# Patient Record
Sex: Male | Born: 1950
Health system: Southern US, Community
[De-identification: ages and names within clinical notes are randomized; demographics above are authoritative.]

## PROBLEM LIST (undated history)

## (undated) DIAGNOSIS — E785 Hyperlipidemia, unspecified: Secondary | ICD-10-CM

## (undated) DIAGNOSIS — I10 Essential (primary) hypertension: Secondary | ICD-10-CM

## (undated) DIAGNOSIS — R600 Localized edema: Secondary | ICD-10-CM

## (undated) DIAGNOSIS — N189 Chronic kidney disease, unspecified: Secondary | ICD-10-CM

## (undated) DIAGNOSIS — I509 Heart failure, unspecified: Secondary | ICD-10-CM

## (undated) DIAGNOSIS — R609 Edema, unspecified: Secondary | ICD-10-CM

## (undated) HISTORY — DX: Chronic kidney disease, unspecified: N18.9

---

## 2006-05-16 ENCOUNTER — Other Ambulatory Visit: Payer: Self-pay

## 2006-05-16 ENCOUNTER — Emergency Department: Payer: Self-pay | Admitting: Unknown Physician Specialty

## 2006-12-31 ENCOUNTER — Ambulatory Visit: Payer: Self-pay

## 2007-01-12 ENCOUNTER — Ambulatory Visit: Payer: Self-pay

## 2018-05-24 DIAGNOSIS — R05 Cough: Secondary | ICD-10-CM | POA: Diagnosis not present

## 2018-05-24 DIAGNOSIS — J181 Lobar pneumonia, unspecified organism: Secondary | ICD-10-CM | POA: Diagnosis not present

## 2018-08-29 DIAGNOSIS — J209 Acute bronchitis, unspecified: Secondary | ICD-10-CM | POA: Diagnosis not present

## 2018-10-26 DIAGNOSIS — I10 Essential (primary) hypertension: Secondary | ICD-10-CM | POA: Diagnosis not present

## 2018-10-26 DIAGNOSIS — I499 Cardiac arrhythmia, unspecified: Secondary | ICD-10-CM | POA: Diagnosis not present

## 2018-10-26 DIAGNOSIS — Z1389 Encounter for screening for other disorder: Secondary | ICD-10-CM | POA: Diagnosis not present

## 2018-11-28 DIAGNOSIS — I499 Cardiac arrhythmia, unspecified: Secondary | ICD-10-CM | POA: Diagnosis not present

## 2018-11-28 DIAGNOSIS — Z0000 Encounter for general adult medical examination without abnormal findings: Secondary | ICD-10-CM | POA: Diagnosis not present

## 2018-11-28 DIAGNOSIS — E785 Hyperlipidemia, unspecified: Secondary | ICD-10-CM | POA: Diagnosis not present

## 2018-11-28 DIAGNOSIS — I10 Essential (primary) hypertension: Secondary | ICD-10-CM | POA: Diagnosis not present

## 2018-11-28 DIAGNOSIS — Z23 Encounter for immunization: Secondary | ICD-10-CM | POA: Diagnosis not present

## 2018-12-25 DIAGNOSIS — E785 Hyperlipidemia, unspecified: Secondary | ICD-10-CM | POA: Diagnosis not present

## 2018-12-25 DIAGNOSIS — I10 Essential (primary) hypertension: Secondary | ICD-10-CM | POA: Diagnosis not present

## 2019-01-11 DIAGNOSIS — E785 Hyperlipidemia, unspecified: Secondary | ICD-10-CM | POA: Diagnosis not present

## 2019-01-11 DIAGNOSIS — Z6836 Body mass index (BMI) 36.0-36.9, adult: Secondary | ICD-10-CM | POA: Diagnosis not present

## 2019-02-13 DIAGNOSIS — I10 Essential (primary) hypertension: Secondary | ICD-10-CM | POA: Diagnosis not present

## 2019-02-13 DIAGNOSIS — D751 Secondary polycythemia: Secondary | ICD-10-CM | POA: Diagnosis not present

## 2019-02-13 DIAGNOSIS — R05 Cough: Secondary | ICD-10-CM | POA: Diagnosis not present

## 2019-02-13 DIAGNOSIS — E785 Hyperlipidemia, unspecified: Secondary | ICD-10-CM | POA: Diagnosis not present

## 2019-02-13 DIAGNOSIS — E871 Hypo-osmolality and hyponatremia: Secondary | ICD-10-CM | POA: Diagnosis not present

## 2019-02-13 DIAGNOSIS — M170 Bilateral primary osteoarthritis of knee: Secondary | ICD-10-CM | POA: Diagnosis not present

## 2020-12-11 ENCOUNTER — Other Ambulatory Visit: Payer: Self-pay | Admitting: Student

## 2020-12-11 ENCOUNTER — Other Ambulatory Visit (HOSPITAL_COMMUNITY): Payer: Self-pay | Admitting: Family Medicine

## 2020-12-11 ENCOUNTER — Other Ambulatory Visit: Payer: Self-pay

## 2020-12-11 ENCOUNTER — Ambulatory Visit
Admission: RE | Admit: 2020-12-11 | Discharge: 2020-12-11 | Disposition: A | Discharge status: Home / Self Care | Payer: Medicare HMO | Source: Ambulatory Visit | Attending: Family Medicine | Admitting: Family Medicine

## 2020-12-11 ENCOUNTER — Ambulatory Visit
Admission: RE | Admit: 2020-12-11 | Discharge: 2020-12-11 | Disposition: A | Discharge status: Home / Self Care | Payer: Medicare HMO | Attending: Family Medicine | Admitting: Family Medicine

## 2020-12-11 DIAGNOSIS — R059 Cough, unspecified: Secondary | ICD-10-CM

## 2020-12-23 ENCOUNTER — Other Ambulatory Visit: Payer: Self-pay | Admitting: Student

## 2020-12-23 DIAGNOSIS — E669 Obesity, unspecified: Secondary | ICD-10-CM

## 2020-12-23 DIAGNOSIS — N183 Chronic kidney disease, stage 3 unspecified: Secondary | ICD-10-CM

## 2020-12-23 DIAGNOSIS — R0601 Orthopnea: Secondary | ICD-10-CM

## 2020-12-30 ENCOUNTER — Other Ambulatory Visit: Payer: Self-pay

## 2020-12-30 ENCOUNTER — Ambulatory Visit
Admission: RE | Admit: 2020-12-30 | Discharge: 2020-12-30 | Disposition: A | Discharge status: Home / Self Care | Payer: Medicare HMO | Source: Ambulatory Visit | Attending: Obstetrics and Gynecology | Admitting: Obstetrics and Gynecology

## 2020-12-30 ENCOUNTER — Other Ambulatory Visit: Payer: Self-pay | Admitting: Student

## 2020-12-30 DIAGNOSIS — N183 Chronic kidney disease, stage 3 unspecified: Secondary | ICD-10-CM | POA: Diagnosis present

## 2021-01-07 ENCOUNTER — Other Ambulatory Visit: Payer: Self-pay

## 2021-01-07 ENCOUNTER — Ambulatory Visit
Admission: RE | Admit: 2021-01-07 | Discharge: 2021-01-07 | Disposition: A | Discharge status: Home / Self Care | Payer: Medicare HMO | Source: Ambulatory Visit | Attending: Family Medicine | Admitting: Family Medicine

## 2021-01-07 DIAGNOSIS — I081 Rheumatic disorders of both mitral and tricuspid valves: Secondary | ICD-10-CM | POA: Diagnosis not present

## 2021-01-07 DIAGNOSIS — I313 Pericardial effusion (noninflammatory): Secondary | ICD-10-CM | POA: Insufficient documentation

## 2021-01-07 DIAGNOSIS — R0601 Orthopnea: Secondary | ICD-10-CM | POA: Insufficient documentation

## 2021-01-07 DIAGNOSIS — Z6839 Body mass index (BMI) 39.0-39.9, adult: Secondary | ICD-10-CM | POA: Insufficient documentation

## 2021-01-07 DIAGNOSIS — E669 Obesity, unspecified: Secondary | ICD-10-CM | POA: Insufficient documentation

## 2021-01-07 LAB — ECHOCARDIOGRAM COMPLETE
AR max vel: 1.6 cm2
AV Area VTI: 1.66 cm2
AV Area mean vel: 1.64 cm2
AV Mean grad: 1.7 mmHg
AV Peak grad: 3.1 mmHg
Ao pk vel: 0.88 m/s
Area-P 1/2: 4.83 cm2
Calc EF: 26.2 %
S' Lateral: 3.98 cm
Single Plane A2C EF: 32.9 %
Single Plane A4C EF: 17.8 %

## 2021-01-07 NOTE — Progress Notes (Signed)
*  PRELIMINARY RESULTS* Echocardiogram 2D Echocardiogram has been performed.  Tyler Ochoa 01/07/2021, 11:41 AM

## 2021-02-02 ENCOUNTER — Other Ambulatory Visit: Payer: Self-pay

## 2021-02-02 ENCOUNTER — Emergency Department: Payer: Medicare HMO

## 2021-02-02 ENCOUNTER — Encounter: Payer: Self-pay | Admitting: Emergency Medicine

## 2021-02-02 ENCOUNTER — Inpatient Hospital Stay
Admission: EM | Admit: 2021-02-02 | Discharge: 2021-02-09 | DRG: 291 | Disposition: A | Discharge status: Home Health | Payer: Medicare HMO | Attending: Internal Medicine | Admitting: Internal Medicine

## 2021-02-02 DIAGNOSIS — E65 Localized adiposity: Secondary | ICD-10-CM | POA: Diagnosis present

## 2021-02-02 DIAGNOSIS — I509 Heart failure, unspecified: Secondary | ICD-10-CM

## 2021-02-02 DIAGNOSIS — I10 Essential (primary) hypertension: Secondary | ICD-10-CM | POA: Diagnosis not present

## 2021-02-02 DIAGNOSIS — E876 Hypokalemia: Secondary | ICD-10-CM | POA: Diagnosis present

## 2021-02-02 DIAGNOSIS — I5023 Acute on chronic systolic (congestive) heart failure: Principal | ICD-10-CM | POA: Diagnosis present

## 2021-02-02 DIAGNOSIS — N179 Acute kidney failure, unspecified: Secondary | ICD-10-CM | POA: Diagnosis present

## 2021-02-02 DIAGNOSIS — M170 Bilateral primary osteoarthritis of knee: Secondary | ICD-10-CM

## 2021-02-02 DIAGNOSIS — E871 Hypo-osmolality and hyponatremia: Secondary | ICD-10-CM | POA: Diagnosis present

## 2021-02-02 DIAGNOSIS — E785 Hyperlipidemia, unspecified: Secondary | ICD-10-CM | POA: Diagnosis present

## 2021-02-02 DIAGNOSIS — E1122 Type 2 diabetes mellitus with diabetic chronic kidney disease: Secondary | ICD-10-CM | POA: Diagnosis present

## 2021-02-02 DIAGNOSIS — K767 Hepatorenal syndrome: Secondary | ICD-10-CM | POA: Diagnosis present

## 2021-02-02 DIAGNOSIS — I5082 Biventricular heart failure: Secondary | ICD-10-CM | POA: Diagnosis present

## 2021-02-02 DIAGNOSIS — I130 Hypertensive heart and chronic kidney disease with heart failure and stage 1 through stage 4 chronic kidney disease, or unspecified chronic kidney disease: Principal | ICD-10-CM | POA: Diagnosis present

## 2021-02-02 DIAGNOSIS — N5089 Other specified disorders of the male genital organs: Secondary | ICD-10-CM | POA: Diagnosis present

## 2021-02-02 DIAGNOSIS — Z6838 Body mass index (BMI) 38.0-38.9, adult: Secondary | ICD-10-CM

## 2021-02-02 DIAGNOSIS — Z20822 Contact with and (suspected) exposure to covid-19: Secondary | ICD-10-CM | POA: Diagnosis present

## 2021-02-02 DIAGNOSIS — E782 Mixed hyperlipidemia: Secondary | ICD-10-CM | POA: Diagnosis not present

## 2021-02-02 DIAGNOSIS — E8770 Fluid overload, unspecified: Secondary | ICD-10-CM | POA: Diagnosis present

## 2021-02-02 DIAGNOSIS — N1831 Chronic kidney disease, stage 3a: Secondary | ICD-10-CM | POA: Diagnosis present

## 2021-02-02 DIAGNOSIS — I248 Other forms of acute ischemic heart disease: Secondary | ICD-10-CM | POA: Diagnosis present

## 2021-02-02 HISTORY — DX: Essential (primary) hypertension: I10

## 2021-02-02 HISTORY — DX: Hyperlipidemia, unspecified: E78.5

## 2021-02-02 HISTORY — DX: Edema, unspecified: R60.9

## 2021-02-02 HISTORY — DX: Localized edema: R60.0

## 2021-02-02 LAB — BASIC METABOLIC PANEL
Anion gap: 14 (ref 5–15)
BUN: 12 mg/dL (ref 8–23)
CO2: 22 mmol/L (ref 22–32)
Calcium: 9.2 mg/dL (ref 8.9–10.3)
Chloride: 89 mmol/L — ABNORMAL LOW (ref 98–111)
Creatinine, Ser: 1.5 mg/dL — ABNORMAL HIGH (ref 0.61–1.24)
GFR, Estimated: 51 mL/min — ABNORMAL LOW (ref >60)
Glucose, Bld: 127 mg/dL — ABNORMAL HIGH (ref 70–99)
Potassium: 3 mmol/L — ABNORMAL LOW (ref 3.5–5.1)
Sodium: 125 mmol/L — ABNORMAL LOW (ref 135–145)

## 2021-02-02 LAB — CBC
HCT: 42.2 % (ref 39.0–52.0)
Hemoglobin: 14.3 g/dL (ref 13.0–17.0)
MCH: 28.5 pg (ref 26.0–34.0)
MCHC: 33.9 g/dL (ref 30.0–36.0)
MCV: 84.2 fL (ref 80.0–100.0)
Platelets: 241 10*3/uL (ref 150–400)
RBC: 5.01 MIL/uL (ref 4.22–5.81)
RDW: 15.9 % — ABNORMAL HIGH (ref 11.5–15.5)
WBC: 3.3 10*3/uL — ABNORMAL LOW (ref 4.0–10.5)
nRBC: 0 % (ref 0.0–0.2)

## 2021-02-02 LAB — TROPONIN I (HIGH SENSITIVITY): Troponin I (High Sensitivity): 46 ng/L — ABNORMAL HIGH (ref <18)

## 2021-02-02 LAB — BRAIN NATRIURETIC PEPTIDE: B Natriuretic Peptide: 449.3 pg/mL — ABNORMAL HIGH (ref 0.0–100.0)

## 2021-02-02 LAB — RESP PANEL BY RT-PCR (FLU A&B, COVID) ARPGX2
Influenza A by PCR: NEGATIVE
Influenza B by PCR: NEGATIVE
SARS Coronavirus 2 by RT PCR: NEGATIVE

## 2021-02-02 MED ORDER — SODIUM CHLORIDE 0.9 % IV SOLN: 250.0000 mL | INTRAVENOUS | Status: DC | PRN

## 2021-02-02 MED ORDER — SODIUM CHLORIDE 0.9% FLUSH
3.0000 mL | Freq: Two times a day (BID) | INTRAVENOUS | Status: DC
  Administered 2021-02-02 – 2021-02-09 (×13): 3 mL via INTRAVENOUS

## 2021-02-02 MED ORDER — FUROSEMIDE 10 MG/ML IJ SOLN
40.0000 mg | Freq: Once | INTRAMUSCULAR | Status: AC
  Administered 2021-02-02: 40 mg via INTRAVENOUS
  Filled 2021-02-02: qty 4

## 2021-02-02 MED ORDER — HEPARIN SODIUM (PORCINE) 5000 UNIT/ML IJ SOLN
5000.0000 [IU] | Freq: Three times a day (TID) | INTRAMUSCULAR | Status: DC
  Administered 2021-02-02 – 2021-02-09 (×20): 5000 [IU] via SUBCUTANEOUS
  Filled 2021-02-02 (×19): qty 1

## 2021-02-02 MED ORDER — FUROSEMIDE 10 MG/ML IJ SOLN
60.0000 mg | Freq: Every day | INTRAMUSCULAR | Status: DC
  Administered 2021-02-03 – 2021-02-04 (×2): 60 mg via INTRAVENOUS
  Filled 2021-02-02: qty 8
  Filled 2021-02-02: qty 6

## 2021-02-02 MED ORDER — ONDANSETRON HCL 4 MG/2ML IJ SOLN: 4.0000 mg | Freq: Four times a day (QID) | INTRAMUSCULAR | Status: DC | PRN

## 2021-02-02 MED ORDER — FUROSEMIDE 10 MG/ML IJ SOLN: 60.0000 mg | Freq: Every day | INTRAMUSCULAR | Status: DC

## 2021-02-02 MED ORDER — ACETAMINOPHEN 325 MG PO TABS
650.0000 mg | ORAL_TABLET | ORAL | Status: DC | PRN
  Administered 2021-02-03 – 2021-02-08 (×2): 650 mg via ORAL
  Filled 2021-02-02 (×3): qty 2

## 2021-02-02 MED ORDER — SODIUM CHLORIDE 0.9% FLUSH: 3.0000 mL | INTRAVENOUS | Status: DC | PRN

## 2021-02-02 MED ORDER — POTASSIUM CHLORIDE 10 MEQ/100ML IV SOLN
10.0000 meq | Freq: Once | INTRAVENOUS | Status: AC
  Administered 2021-02-02: 10 meq via INTRAVENOUS
  Filled 2021-02-02: qty 100

## 2021-02-02 MED ORDER — POTASSIUM CHLORIDE CRYS ER 20 MEQ PO TBCR
40.0000 meq | EXTENDED_RELEASE_TABLET | Freq: Once | ORAL | Status: AC
  Administered 2021-02-02: 40 meq via ORAL
  Filled 2021-02-02: qty 2

## 2021-02-02 NOTE — ED Provider Notes (Signed)
[facility] Emergency Department Provider Note  ____________________________________________   Event Date/Time   First MD Initiated Contact with Patient 02/02/21 1627     (approximate)  I have reviewed the triage vital signs and the nursing notes.   HISTORY  Chief Complaint Groin Swelling    HPI Tyler Ochoa is a 70 y.o. male with history of hypertension, hyperlipidemia, recently diagnosed CHF (EF 25-30%), here with worsening leg swelling and persistent scrotal swelling.  The patient states that since he was sick in January with what he believes was Covid, he has had progressively worsening lower extremity swelling, scrotal swelling, and shortness of breath with exertion.  He was seen recently by a new PCP and diagnosed with CHF.  Started on Lasix and potassium.'s been taking this and states it has steadily improved his legs but he has persistent scrotal swelling.  He presents here for repeat evaluation.  He feels like he needs more Lasix.  He has a PCP appointment next week.  He feels short of breath with exertion but not short of breath at rest.  No severe orthopnea.  No fevers or chills.  No chest pain.       Past Medical History:  Diagnosis Date  . Hyperlipidemia   . Hypertension   . Peripheral edema     Patient Active Problem List   Diagnosis Date Noted  . Acute exacerbation of CHF (congestive heart failure) (HCC) 02/02/2021    History reviewed. No pertinent surgical history.  Prior to Admission medications   Not on File    Allergies Patient has no known allergies.  History reviewed. No pertinent family history.  Social History Social History   Tobacco Use  . Smoking status: Never Smoker  . Smokeless tobacco: Never Used  Vaping Use  . Vaping Use: Never used    Review of Systems  Review of Systems  Constitutional: Positive for fatigue. Negative for chills and fever.  HENT: Negative for sore throat.   Respiratory: Positive  for shortness of breath.   Cardiovascular: Positive for leg swelling. Negative for chest pain.  Gastrointestinal: Negative for abdominal pain.  Genitourinary: Negative for flank pain.  Musculoskeletal: Negative for neck pain.  Skin: Negative for rash and wound.  Allergic/Immunologic: Negative for immunocompromised state.  Neurological: Positive for weakness. Negative for numbness.  Hematological: Does not bruise/bleed easily.  All other systems reviewed and are negative.    ____________________________________________  PHYSICAL EXAM:      VITAL SIGNS: ED Triage Vitals [02/02/21 1350]  Enc Vitals Group     BP 119/79     Pulse Rate (!) 101     Resp 20     Temp 98.1 F (36.7 C)     Temp Source Oral     SpO2 97 %     Weight (!) 317 lb (143.8 kg)     Height 6\' 2"  (1.88 m)     Head Circumference      Peak Flow      Pain Score 0     Pain Loc      Pain Edu?      Excl. in GC?      Physical Exam Vitals and nursing note reviewed.  Constitutional:      General: He is not in acute distress.    Appearance: He is well-developed.  HENT:     Head: Normocephalic and atraumatic.  Eyes:     Conjunctiva/sclera: Conjunctivae normal.  Cardiovascular:     Rate and  Rhythm: Normal rate and regular rhythm.     Heart sounds: Normal heart sounds.  Pulmonary:     Effort: Pulmonary effort is normal. No respiratory distress.     Breath sounds: Rales (bibasilar rales) present. No wheezing.  Abdominal:     General: There is no distension.  Musculoskeletal:     Cervical back: Neck supple.     Right lower leg: Edema (2+ pitting) present.     Left lower leg: Edema (2+ pitting) present.  Skin:    General: Skin is warm.     Capillary Refill: Capillary refill takes less than 2 seconds.     Findings: No rash.  Neurological:     Mental Status: He is alert and oriented to person, place, and time.     Motor: No abnormal muscle tone.       ____________________________________________    LABS (all labs ordered are listed, but only abnormal results are displayed)  Labs Reviewed  CBC - Abnormal; Notable for the following components:      Result Value   WBC 3.3 (*)    RDW 15.9 (*)    All other components within normal limits  BASIC METABOLIC PANEL - Abnormal; Notable for the following components:   Sodium 125 (*)    Potassium 3.0 (*)    Chloride 89 (*)    Glucose, Bld 127 (*)    Creatinine, Ser 1.50 (*)    GFR, Estimated 51 (*)    All other components within normal limits  BRAIN NATRIURETIC PEPTIDE - Abnormal; Notable for the following components:   B Natriuretic Peptide 449.3 (*)    All other components within normal limits  TROPONIN I (HIGH SENSITIVITY) - Abnormal; Notable for the following components:   Troponin I (High Sensitivity) 46 (*)    All other components within normal limits  RESP PANEL BY RT-PCR (FLU A&B, COVID) ARPGX2  OSMOLALITY  URINALYSIS, COMPLETE (UACMP) WITH MICROSCOPIC  OSMOLALITY, URINE  SODIUM, URINE, RANDOM  CREATININE, URINE, RANDOM  HIV ANTIBODY (ROUTINE TESTING W REFLEX)  BASIC METABOLIC PANEL  CBC WITH DIFFERENTIAL/PLATELET  TSH    ____________________________________________  EKG: Sinus rhythm, ventricular rate 97.  QRS 84, QTc 445.  No acute ST elevations or depressions.  Prolonged PR interval. ________________________________________  RADIOLOGY All imaging, including plain films, CT scans, and ultrasounds, independently reviewed by me, and interpretations confirmed via formal radiology reads.  ED MD interpretation:   Chest x-ray: No acute disease  Official radiology report(s): DG Chest 2 View  Result Date: 02/02/2021 CLINICAL DATA:  Cough, shortness of breath. EXAM: CHEST - 2 VIEW COMPARISON:  December 11, 2020. FINDINGS: Stable cardiomediastinal silhouette. No pneumothorax or pleural effusion is noted. Both lungs are clear. The visualized skeletal structures are unremarkable. IMPRESSION: No active cardiopulmonary disease.  Electronically Signed   By: Lupita Raider M.D.   On: 02/02/2021 18:26    ____________________________________________  PROCEDURES   Procedure(s) performed (including Critical Care):  .1-3 Lead EKG Interpretation Performed by: Shaune Pollack, MD Authorized by: Shaune Pollack, MD     Interpretation: normal     ECG rate:  90-100   ECG rate assessment: normal     Rhythm: sinus rhythm     Ectopy: none     Conduction: normal   Comments:     Indication: CHF    ____________________________________________  INITIAL IMPRESSION / MDM / ASSESSMENT AND PLAN / ED COURSE  As part of my medical decision making, I reviewed the following data within the electronic  MEDICAL RECORD NUMBER Nursing notes reviewed and incorporated, Old chart reviewed, Notes from prior ED visits, and [place] Controlled Substance Database       *ONESIMO LINGARD was evaluated in Emergency Department on 02/02/2021 for the symptoms described in the history of present illness. He was evaluated in the context of the global COVID-19 pandemic, which necessitated consideration that the patient might be at risk for infection with the SARS-CoV-2 virus that causes COVID-19. Institutional protocols and algorithms that pertain to the evaluation of patients at risk for COVID-19 are in a state of rapid change based on information released by regulatory bodies including the CDC and federal and state organizations. These policies and algorithms were followed during the patient's care in the ED.  Some ED evaluations and interventions may be delayed as a result of limited staffing during the pandemic.*     Medical Decision Making: 70 year old male here with anasarca secondary to systolic CHF with acute exacerbation.  Patient drinks a significant amount of fluid daily.  Lab work is consistent with CHF with elevated BNP as well as hyponatremia, which I suspect is hypovolemic in etiology.  Will start IV Lasix and admit.  Patient has no headache,  tremors, confusion, or evidence to suggest symptomatic hyponatremia.  ____________________________________________  FINAL CLINICAL IMPRESSION(S) / ED DIAGNOSES  Final diagnoses:  Acute on chronic systolic congestive heart failure (HCC)     MEDICATIONS GIVEN DURING THIS VISIT:  Medications  sodium chloride flush (NS) 0.9 % injection 3 mL (has no administration in time range)  sodium chloride flush (NS) 0.9 % injection 3 mL (has no administration in time range)  0.9 %  sodium chloride infusion (has no administration in time range)  acetaminophen (TYLENOL) tablet 650 mg (has no administration in time range)  ondansetron (ZOFRAN) injection 4 mg (has no administration in time range)  heparin injection 5,000 Units (has no administration in time range)  furosemide (LASIX) injection 60 mg (has no administration in time range)  furosemide (LASIX) injection 40 mg (40 mg Intravenous Given 02/02/21 1657)  potassium chloride SA (KLOR-CON) CR tablet 40 mEq (40 mEq Oral Given 02/02/21 1657)  potassium chloride 10 mEq in 100 mL IVPB (0 mEq Intravenous Stopped 02/02/21 1808)     ED Discharge Orders    None       Note:  This document was prepared using Dragon voice recognition software and may include unintentional dictation errors.   Shaune Pollack, MD 02/02/21 [phone]

## 2021-02-02 NOTE — ED Triage Notes (Signed)
Pt presents to the ED from home, POV. Pt states he has been recently prescribed Lasix for bilateral leg and scrotal swelling last Friday. Pt states that the swelling went down some in his ankle but states the swelling hasn't went down in his scrotum. Denies pain. Denies SOB. Pt is A&Ox4 and NAD.

## 2021-02-02 NOTE — H&P (Signed)
History and Physical   Tyler Ochoa:[id number] DOB: 1951-03-19 DOA: 02/02/2021  PCP: Manson Allan, MD  Patient coming from: home  I have personally briefly reviewed patient's old medical records in [place].  Chief Concern: shortness of breath  HPI: Tyler Ochoa is a 70 y.o. male with medical history significant for hypertension, heart failure reduced ejection fraction, is a new diagnosis, presents to the emergency department for chief concerns of shortness of breath that is worse with exertion.  Patient has been having shortness of breath over the last 3 months.  He reports that he has gained unintentionally 30 pounds.  He also endorses bilateral lower extremity edema and scrotal swelling.  He denies scrotal pain.  He states that the main reason he is coming into the hospital because with Lasix compliance he still does not have improved scrotal swelling.  He endorses bilateral lower extremity edema.  He states that with the Lasix use his lower extremity edema has improved.  He was recently diagnosed with heart failure reduced ejection fraction via cardiac echo ordered by his PCP for 01/07/2021.  He was prescribed Lasix 20 mg daily, which she has been taking.  Of note on further questioning, patient drinks 1 gallon of water every day. Patients states that he has been drinking 1 gallon of water per day for a long time as he was told in the past "drinking a lot of water was good for and he gets thirsty.  Occasionally, he consumes 12 ounce beers per day & Sunny D.  He states he does not drink tea, coffee, soda.  Social history: He lives with his wife/partner/girlfriend.  He infrequently drinks alcohol.  He denies tobacco history and recreational drug use.  Vaccination: Patient is vaccinated for COVID-19  At bedside, he is not in acute distress and not having any difficulty maintaining conversation with me.  He is able to tell me his name, his age, as location of hospital, the  current calendar year.  No increase respiratory muscle use with full conversation.  His main concern is the scrotal swelling.  ROS: Constitutional: no weight change, no fever ENT/Mouth: no sore throat, no rhinorrhea Eyes: no eye pain, no vision changes Cardiovascular: no chest pain, no dyspnea,  no edema, no palpitations Respiratory: no cough, no sputum, no wheezing Gastrointestinal: no nausea, no vomiting, no diarrhea, no constipation Genitourinary: no urinary incontinence, no dysuria, no hematuria Musculoskeletal: no arthralgias, no myalgias Skin: no skin lesions, no pruritus, Neuro: + weakness, no loss of consciousness, no syncope Psych: no anxiety, no depression, + decrease appetite Heme/Lymph: no bruising, no bleeding  ED Course: Discussed with ED provider, patient requiring hospitalization due to heart failure exacerbation. Vitals in the emergency department show temperature of 98.1, respiration rate of 17, heart rate of 91, blood pressure 118/66, patient satting at 97% on room air.    Assessment/Plan  Principal Problem:   Acute exacerbation of CHF (congestive heart failure) (HCC) Active Problems:   Essential hypertension   Hyperlipidemia   Truncal obesity   Osteoarthritis of both knees   # Heart failure exacerbation # Scrotal swelling # Bilateral lower extremity edema -Echo on 01/07/2021: EF 25 to 30%, global hypokinesis, small pericardial effusion -Home Lasix 20 mg p.o. daily not resumed -BNP 449, troponin 46 initially -Strict I's and O's -Status post furosemide 40 mg IV once per ED provider -Started furosemide 60 mg daily, 02/03/2021 -Cardiology consulted via secure chat for heart failure medication optimization, Dr. Jill Side will see  the patient  # Hypervolemic hyponatremia-secondary to fluid overload -Treat as above -BMP in a.m.  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   DVT prophylaxis: Heparin 5000 units every 8 hours subcutaneous Code Status: Full code Diet: Heart healthy Family Communication: Updated wife at bedside Disposition Plan: Pending clinical course Consults called: Cardiology Admission status: Progressive cardiac, observation, with telemetry  Past Medical History:  Diagnosis Date  . Hyperlipidemia   . Hypertension   . Peripheral edema    History reviewed. No pertinent surgical history.  Social History:  reports that he has never smoked. He has never used smokeless tobacco. No history on file for alcohol use and drug use.  No Known Allergies History reviewed. No pertinent family history. Family history: Family history reviewed and not pertinent  Prior to Admission medications   Not on File   Physical Exam: Vitals:   02/02/21 1737 02/02/21 1959 02/02/21 2123 02/02/21 2225  BP: 118/66 120/77 117/69 126/86  Pulse: 98 95 92 92  Resp: $Remo'17 17 17 17  'bDzQk$ Temp:      TempSrc:      SpO2: 97% 98% 96% 95%  Weight:      Height:       Constitutional: appears age-appropriate, NAD, calm, comfortable Eyes: PERRL, lids and conjunctivae normal ENMT: Mucous membranes are moist. Posterior pharynx clear of any exudate or lesions. Age-appropriate dentition. Hearing appropriate Neck: normal, supple, no masses, no thyromegaly Respiratory: clear to auscultation bilaterally, no wheezing, no crackles. Normal respiratory effort. No accessory muscle use.  Cardiovascular: Regular rate and rhythm, no murmurs / rubs / gallops.  Bilateral 2+ pitting edema of the lower extremity. 2+ pedal pulses. No carotid bruits.  Abdomen: Obese abdomen, no  tenderness, no masses palpated, no hepatosplenomegaly. Bowel sounds positive.  Musculoskeletal: no clubbing / cyanosis. No joint deformity upper and lower extremities. Good ROM, no contractures, no atrophy. Normal muscle tone.  GU: bilateral scrotal swelling, negative for tenderness Skin: no rashes, lesions, ulcers. No induration Neurologic: Sensation intact. Strength 5/5 in all 4.  Psychiatric: Normal judgment and insight. Alert and oriented x 3. Normal mood.   EKG: independently reviewed, showing sinus rhythm with rate of 97, QTc 445, low amplitude diffusely  Chest x-ray on Admission: I personally reviewed and I agree with radiologist reading as below.  DG Chest 2 View  Result Date: 02/02/2021 CLINICAL DATA:  Cough, shortness of breath. EXAM: CHEST - 2 VIEW COMPARISON:  December 11, 2020. FINDINGS: Stable cardiomediastinal silhouette. No pneumothorax or pleural effusion is noted. Both lungs are clear. The visualized skeletal structures are unremarkable. IMPRESSION: No active cardiopulmonary disease. Electronically Signed   By: Marijo Conception M.D.   On: 02/02/2021 18:26   Labs on Admission: I have personally reviewed following labs  CBC: Recent Labs  Lab 02/02/21 1359  WBC 3.3*  HGB 14.3  HCT 42.2  MCV 84.2  PLT 676   Basic Metabolic Panel: Recent Labs  Lab 02/02/21 1359  NA 125*  K 3.0*  CL 89*  CO2 22  GLUCOSE 127*  BUN 12  CREATININE 1.50*  CALCIUM 9.2   GFR: Estimated Creatinine Clearance: 72.2 mL/min (  A) (by C-G formula based on SCr of 1.5 mg/dL (H)).  Zaquan Duffner N Mariaisabel Bodiford D.O. [facility]  If 7PM-7AM, please contact overnight-coverage provider If 7AM-7PM, please contact day coverage provider [email or web address]  02/02/2021, 10:47 PM

## 2021-02-03 DIAGNOSIS — Z20822 Contact with and (suspected) exposure to covid-19: Secondary | ICD-10-CM | POA: Diagnosis present

## 2021-02-03 DIAGNOSIS — E8770 Fluid overload, unspecified: Secondary | ICD-10-CM | POA: Diagnosis present

## 2021-02-03 DIAGNOSIS — M170 Bilateral primary osteoarthritis of knee: Secondary | ICD-10-CM | POA: Diagnosis present

## 2021-02-03 DIAGNOSIS — E871 Hypo-osmolality and hyponatremia: Secondary | ICD-10-CM | POA: Diagnosis present

## 2021-02-03 DIAGNOSIS — I509 Heart failure, unspecified: Secondary | ICD-10-CM | POA: Diagnosis present

## 2021-02-03 DIAGNOSIS — I5021 Acute systolic (congestive) heart failure: Secondary | ICD-10-CM | POA: Diagnosis not present

## 2021-02-03 DIAGNOSIS — I130 Hypertensive heart and chronic kidney disease with heart failure and stage 1 through stage 4 chronic kidney disease, or unspecified chronic kidney disease: Secondary | ICD-10-CM | POA: Diagnosis present

## 2021-02-03 DIAGNOSIS — I248 Other forms of acute ischemic heart disease: Secondary | ICD-10-CM | POA: Diagnosis present

## 2021-02-03 DIAGNOSIS — N179 Acute kidney failure, unspecified: Secondary | ICD-10-CM | POA: Diagnosis present

## 2021-02-03 DIAGNOSIS — E782 Mixed hyperlipidemia: Secondary | ICD-10-CM | POA: Diagnosis not present

## 2021-02-03 DIAGNOSIS — N1831 Chronic kidney disease, stage 3a: Secondary | ICD-10-CM | POA: Diagnosis present

## 2021-02-03 DIAGNOSIS — E1122 Type 2 diabetes mellitus with diabetic chronic kidney disease: Secondary | ICD-10-CM | POA: Diagnosis present

## 2021-02-03 DIAGNOSIS — I5023 Acute on chronic systolic (congestive) heart failure: Secondary | ICD-10-CM | POA: Diagnosis present

## 2021-02-03 DIAGNOSIS — E876 Hypokalemia: Secondary | ICD-10-CM | POA: Diagnosis present

## 2021-02-03 DIAGNOSIS — Z6838 Body mass index (BMI) 38.0-38.9, adult: Secondary | ICD-10-CM | POA: Diagnosis not present

## 2021-02-03 DIAGNOSIS — K767 Hepatorenal syndrome: Secondary | ICD-10-CM | POA: Diagnosis present

## 2021-02-03 DIAGNOSIS — E785 Hyperlipidemia, unspecified: Secondary | ICD-10-CM | POA: Diagnosis present

## 2021-02-03 DIAGNOSIS — N5089 Other specified disorders of the male genital organs: Secondary | ICD-10-CM | POA: Diagnosis present

## 2021-02-03 DIAGNOSIS — I10 Essential (primary) hypertension: Secondary | ICD-10-CM | POA: Diagnosis not present

## 2021-02-03 DIAGNOSIS — I5082 Biventricular heart failure: Secondary | ICD-10-CM | POA: Diagnosis present

## 2021-02-03 LAB — CBC WITH DIFFERENTIAL/PLATELET
Abs Immature Granulocytes: 0.01 10*3/uL (ref 0.00–0.07)
Basophils Absolute: 0.1 10*3/uL (ref 0.0–0.1)
Basophils Relative: 2 %
Eosinophils Absolute: 0.1 10*3/uL (ref 0.0–0.5)
Eosinophils Relative: 3 %
HCT: 41.6 % (ref 39.0–52.0)
Hemoglobin: 14.4 g/dL (ref 13.0–17.0)
Immature Granulocytes: 0 %
Lymphocytes Relative: 19 %
Lymphs Abs: 0.6 10*3/uL — ABNORMAL LOW (ref 0.7–4.0)
MCH: 28.8 pg (ref 26.0–34.0)
MCHC: 34.6 g/dL (ref 30.0–36.0)
MCV: 83.2 fL (ref 80.0–100.0)
Monocytes Absolute: 0.4 10*3/uL (ref 0.1–1.0)
Monocytes Relative: 14 %
Neutro Abs: 1.9 10*3/uL (ref 1.7–7.7)
Neutrophils Relative %: 62 %
Platelets: 223 10*3/uL (ref 150–400)
RBC: 5 MIL/uL (ref 4.22–5.81)
RDW: 15.9 % — ABNORMAL HIGH (ref 11.5–15.5)
WBC: 3 10*3/uL — ABNORMAL LOW (ref 4.0–10.5)
nRBC: 0 % (ref 0.0–0.2)

## 2021-02-03 LAB — BASIC METABOLIC PANEL
Anion gap: 13 (ref 5–15)
BUN: 11 mg/dL (ref 8–23)
CO2: 22 mmol/L (ref 22–32)
Calcium: 9.2 mg/dL (ref 8.9–10.3)
Chloride: 89 mmol/L — ABNORMAL LOW (ref 98–111)
Creatinine, Ser: 1.62 mg/dL — ABNORMAL HIGH (ref 0.61–1.24)
GFR, Estimated: 46 mL/min — ABNORMAL LOW (ref >60)
Glucose, Bld: 99 mg/dL (ref 70–99)
Potassium: 4.4 mmol/L (ref 3.5–5.1)
Sodium: 124 mmol/L — ABNORMAL LOW (ref 135–145)

## 2021-02-03 LAB — URINALYSIS, COMPLETE (UACMP) WITH MICROSCOPIC
Bacteria, UA: NONE SEEN
Bilirubin Urine: NEGATIVE
Glucose, UA: NEGATIVE mg/dL
Hgb urine dipstick: NEGATIVE
Ketones, ur: NEGATIVE mg/dL
Leukocytes,Ua: NEGATIVE
Nitrite: NEGATIVE
Protein, ur: NEGATIVE mg/dL
Specific Gravity, Urine: 1.006 (ref 1.005–1.030)
pH: 7 (ref 5.0–8.0)

## 2021-02-03 LAB — OSMOLALITY: Osmolality: 264 mOsm/kg — ABNORMAL LOW (ref 275–295)

## 2021-02-03 LAB — TROPONIN I (HIGH SENSITIVITY): Troponin I (High Sensitivity): 55 ng/L — ABNORMAL HIGH (ref <18)

## 2021-02-03 LAB — TSH: TSH: 2.333 u[IU]/mL (ref 0.350–4.500)

## 2021-02-03 LAB — MAGNESIUM: Magnesium: 2.2 mg/dL (ref 1.7–2.4)

## 2021-02-03 LAB — CREATININE, URINE, RANDOM: Creatinine, Urine: 65 mg/dL

## 2021-02-03 LAB — HIV ANTIBODY (ROUTINE TESTING W REFLEX): HIV Screen 4th Generation wRfx: NONREACTIVE

## 2021-02-03 LAB — OSMOLALITY, URINE: Osmolality, Ur: 275 mOsm/kg — ABNORMAL LOW (ref 300–900)

## 2021-02-03 LAB — SODIUM, URINE, RANDOM: Sodium, Ur: 80 mmol/L

## 2021-02-03 MED ORDER — POTASSIUM CHLORIDE CRYS ER 20 MEQ PO TBCR
40.0000 meq | EXTENDED_RELEASE_TABLET | Freq: Every day | ORAL | Status: DC
  Administered 2021-02-03: 40 meq via ORAL
  Filled 2021-02-03: qty 2

## 2021-02-03 MED ORDER — SACUBITRIL-VALSARTAN 24-26 MG PO TABS
1.0000 | ORAL_TABLET | Freq: Two times a day (BID) | ORAL | Status: DC
  Administered 2021-02-04 – 2021-02-09 (×11): 1 via ORAL
  Filled 2021-02-03 (×11): qty 1

## 2021-02-03 MED ORDER — METOPROLOL SUCCINATE ER 50 MG PO TB24
50.0000 mg | ORAL_TABLET | Freq: Every day | ORAL | Status: DC
  Administered 2021-02-03 – 2021-02-09 (×7): 50 mg via ORAL
  Filled 2021-02-03 (×7): qty 1

## 2021-02-03 NOTE — ED Notes (Signed)
MD at bedside. 

## 2021-02-03 NOTE — Progress Notes (Signed)
PROGRESS NOTE  Tyler Ochoa Tyler Ochoa DOB: [birth year] DOA: 02/02/2021 PCP: Queen Slough, MD  HPI/Recap of past 24 hours:  Tyler Ochoa is a 70 y.o. male with medical history significant for hypertension, heart failure reduced ejection fraction, is a new diagnosis, presents to the emergency department for chief concerns of shortness of breath that is worse with exertion.  Patient has been having shortness of breath over the last 3 months.  He reports that he has gained unintentionally 30 pounds.  He also endorses bilateral lower extremity edema and scrotal swelling.  He denies scrotal pain.  He states that the main reason he is coming into the hospital because with Lasix compliance he still does not have improved scrotal swelling.  He endorses bilateral lower extremity edema.  He states that with the Lasix use his lower extremity edema has improved.  He was recently diagnosed with heart failure reduced ejection fraction via cardiac echo ordered by his PCP for 01/07/2021.  He was prescribed Lasix 20 mg daily, which she has been taking.  Of note on further questioning, patient drinks 1 gallon of water every day. Patients states that he has been drinking 1 gallon of water per day for a long time as he was told in the past "drinking a lot of water was good for and he gets thirsty.  Occasionally, he consumes 12 ounce beers per day & Sunny D.  He states he does not drink tea, coffee, soda.  Social history: He lives with his wife/partner/girlfriend.  He infrequently drinks alcohol.  He denies tobacco history and recreational drug use.  Vaccination: Patient is vaccinated for COVID-19  At bedside, he is not in acute distress and not having any difficulty maintaining conversation with me.  He is able to tell me his name, his age, as location of hospital, the current calendar year.  No increase respiratory muscle use with full conversation.  His main concern is the scrotal  swelling.  02/03/21: Patient was seen and examined at his bedside this morning.  His wife was present.  Anasarca noted on exam.  He denies having any chest pain or shortness of breath while at rest however states that he is always short of breath with minimal exertion.   Assessment/Plan: Principal Problem:   Acute exacerbation of CHF (congestive heart failure) (HCC) Active Problems:   Essential hypertension   Hyperlipidemia   Truncal obesity   Osteoarthritis of both knees  Acute systolic CHF exacerbation. 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 Start strict I's and O's and daily weight Replete electrolytes as indicated Closely monitor BP  Elevated troponin, likely demand ischemia Denies any chest pain or anginal symptoms. Monitor on telemetry  Hypervolemic hyponatremia Serum sodium 124 Continue diuresing Repeat serum sodium  Anasarca/scrotal edema Continue diuresing  Essential hypertension Resume home Toprol-XL and Entresto at lower doses due to IV diuresing. Monitor vital signs and avoid hypotension, maintain MAP greater than 65.  CKD 3A Presented with creatinine 1.5 with GFR 51 No prior records to compare This morning creatinine uptrending 1.6 with GFR 46. Continue to diuresis and closely monitor renal function If renal function worsens consider nephrology's input  Obesity BMI 39 Recommend weight loss outpatient with healthy dieting and regular physical activity.  Code Status: Full code.  Family Communication: Updated his wife at bedside.  Disposition Plan: Likely will return to home after anasarca has  resolved.   Consultants:  None.  Procedures:  None.  Antimicrobials:  None.  DVT prophylaxis: SQ heparin 3 times daily.  Status is: Inpatient    Dispo:  Patient From: Home  Planned Disposition: Home  Medically stable for discharge: No, ongoing management of anasarca, acute systolic CHF, hypervolemic hyponatremia.          Objective: Vitals:   02/03/21 1400 02/03/21 1430 02/03/21 1456 02/03/21 1635  BP: (!) 110/93 100/75 118/78 98/70  Pulse: (!) 109 93 (!) 105 88  Resp: (!) 24 19 20 18   Temp:   98 F (36.7 C) 97.8 F (36.6 C)  TempSrc:   Oral   SpO2: (!) 89% 97% 97% 98%  Weight:   (!) 140.2 kg   Height:   6\' 2"  (1.88 m)     Intake/Output Summary (Last 24 hours) at 02/03/2021 1740 Last data filed at 02/03/2021 1634 Gross per 24 hour  Intake 100 ml  Output 1800 ml  Net -1700 ml   Filed Weights   02/02/21 1350 02/03/21 1456  Weight: (!) 143.8 kg (!) 140.2 kg    Exam:  . General: 70 y.o. year-old male obese in no acute distress.  Alert and oriented x3. . Cardiovascular: Regular rate and rhythm with no rubs or gallops.  No thyromegaly or JVD noted.   02/05/21 Respiratory: Mild rales at bases with no wheezing noted.  Good inspiratory effort. . Abdomen: Obese with anasarca, distended, bowel sounds present.  . Musculoskeletal: 3+ pitting edema lower extremities bilaterally.  . Skin: No ulcerative lesions noted or rashes. . Psychiatry: Mood is appropriate for condition and setting   Data Reviewed: CBC: Recent Labs  Lab 02/02/21 1359 02/03/21 0438  WBC 3.3* 3.0*  NEUTROABS  --  1.9  HGB 14.3 14.4  HCT 42.2 41.6  MCV 84.2 83.2  PLT 241 223   Basic Metabolic Panel: Recent Labs  Lab 02/02/21 1359 02/03/21 0714  NA 125* 124*  K 3.0* 4.4  CL 89* 89*  CO2 22 22  GLUCOSE 127* 99  BUN 12 11  CREATININE 1.50* 1.62*  CALCIUM 9.2 9.2  MG  --  2.2   GFR: Estimated Creatinine Clearance: 66 mL/min (A) (by C-G formula based on SCr of 1.62 mg/dL  (H)). Liver Function Tests: No results for input(s): AST, ALT, ALKPHOS, BILITOT, PROT, ALBUMIN in the last 168 hours. No results for input(s): LIPASE, AMYLASE in the last 168 hours. No results for input(s): AMMONIA in the last 168 hours. Coagulation Profile: No results for input(s): INR, PROTIME in the last 168 hours. Cardiac Enzymes: No results for input(s): CKTOTAL, CKMB, CKMBINDEX, TROPONINI in the last 168 hours. BNP (last 3 results) No results for input(s): PROBNP in the last 8760 hours. HbA1C: No results for input(s): HGBA1C in the last 72 hours. CBG: No results for input(s): GLUCAP in the last 168 hours. Lipid Profile: No results for input(s): CHOL, HDL, LDLCALC, TRIG, CHOLHDL, LDLDIRECT in the last 72 hours. Thyroid Function Tests: Recent Labs    02/02/21 2335  TSH 2.333   Anemia Panel: No results for input(s): VITAMINB12, FOLATE, FERRITIN, TIBC, IRON, RETICCTPCT in the last 72 hours. Urine analysis:    Component Value Date/Time   COLORURINE YELLOW (A) 02/02/2021 0715   APPEARANCEUR CLEAR (A) 02/02/2021 0715   LABSPEC 1.006 02/02/2021 0715   PHURINE 7.0 02/02/2021 0715  GLUCOSEU NEGATIVE 02/02/2021 0715   HGBUR NEGATIVE 02/02/2021 0715   BILIRUBINUR NEGATIVE 02/02/2021 0715   KETONESUR NEGATIVE 02/02/2021 0715   PROTEINUR NEGATIVE 02/02/2021 0715   NITRITE NEGATIVE 02/02/2021 0715   LEUKOCYTESUR NEGATIVE 02/02/2021 0715   Sepsis Labs: @LABRCNTIP (procalcitonin:4,lacticidven:4)  ) Recent Results (from the past 240 hour(s))  Resp Panel by RT-PCR (Flu A&B, Covid) Nasopharyngeal Swab     Status: None   Collection Time: 02/02/21  6:12 PM   Specimen: Nasopharyngeal Swab; Nasopharyngeal(NP) swabs in vial transport medium  Result Value Ref Range Status   SARS Coronavirus 2 by RT PCR NEGATIVE NEGATIVE Final    Comment: (NOTE) SARS-CoV-2 target nucleic acids are NOT DETECTED.  The SARS-CoV-2 RNA is generally detectable in upper respiratory specimens during the  acute phase of infection. The lowest concentration of SARS-CoV-2 viral copies this assay can detect is 138 copies/mL. A negative result does not preclude SARS-Cov-2 infection and should not be used as the sole basis for treatment or other patient management decisions. A negative result may occur with  improper specimen collection/handling, submission of specimen other than nasopharyngeal swab, presence of viral mutation(s) within the areas targeted by this assay, and inadequate number of viral copies(<138 copies/mL). A negative result must be combined with clinical observations, patient history, and epidemiological information. The expected result is Negative.  Fact Sheet for Patients:  02/04/21  Fact Sheet for Healthcare Providers:  BloggerCourse.com  This test is no t yet approved or cleared by the [employer].[place] FDA and  has been authorized for detection and/or diagnosis of SARS-CoV-2 by FDA under an Emergency Use Authorization (EUA). This EUA will remain  in effect (meaning this test can be used) for the duration of the COVID-19 declaration under Section 564(b)(1) of the Act, 21 U.S.C.section 360bbb-3(b)(1), unless the authorization is terminated  or revoked sooner.       Influenza A by PCR NEGATIVE NEGATIVE Final   Influenza B by PCR NEGATIVE NEGATIVE Final    Comment: (NOTE) The Xpert Xpress SARS-CoV-2/FLU/RSV plus assay is intended as an aid in the diagnosis of influenza from Nasopharyngeal swab specimens and should not be used as a sole basis for treatment. Nasal washings and aspirates are unacceptable for Xpert Xpress SARS-CoV-2/FLU/RSV testing.  Fact Sheet for Patients: [place]  Fact Sheet for Healthcare Providers: BloggerCourse.com  This test is not yet approved or cleared by the [employer]ious[employer]ker.it FDA and has been authorized for detection and/or  diagnosis of SARS-CoV-2 by FDA under an Emergency Use Authorization (EUA). This EUA will remain in effect (meaning this test can be used) for the duration of the COVID-19 declaration under Section 564(b)(1) of the Act, 21 U.S.C. section 360bbb-3(b)(1), unless the authorization is terminated or revoked.  Performed at [facility], [address] [place], [place]       Studies: DG Chest 2 View  Result Date: 02/02/2021 CLINICAL DATA:  Cough, shortness of breath. EXAM: CHEST - 2 VIEW COMPARISON:  December 11, 2020. FINDINGS: Stable cardiomediastinal silhouette. No pneumothorax or pleural effusion is noted. Both lungs are clear. The visualized skeletal structures are unremarkable. IMPRESSION: No active cardiopulmonary disease. Electronically Signed   By: December 13, 2020 M.D.   On: 02/02/2021 18:26    Scheduled Meds: . furosemide  60 mg Intravenous Daily  . heparin  5,000 Units Subcutaneous Q8H  . metoprolol succinate  50 mg Oral Daily  . [START ON 02/04/2021] sacubitril-valsartan  1 tablet Oral BID  . sodium chloride flush  3 mL  Intravenous Q12H    Continuous Infusions: . sodium chloride       LOS: 0 days     Darlin Droparole N Keina Mutch, MD [facility] Pager [phone]  If 7PM-7AM, please contact night-coverage [email or web address] Password [email or web address] 02/03/2021, 5:40 PM

## 2021-02-03 NOTE — ED Notes (Addendum)
Multiple attempts made to obtain blood work. IV not working and occluded at this time. When attempting to adjust site noted to infiltrate with flushing. IV team consult to be placed and lab contacted to collect blood.

## 2021-02-03 NOTE — ED Notes (Signed)
Food tray given to pt

## 2021-02-03 NOTE — ED Notes (Signed)
ED Tech taking pt to floor

## 2021-02-03 NOTE — ED Notes (Signed)
Assisting pt with meal tray .  

## 2021-02-03 NOTE — Consult Note (Signed)
Tyler Ochoa is a 70 y.o. male  [id number]  Primary Cardiologist: N/A Reason for Consultation: HFrEF Exacerbation  HPI: Patient is a 70 year old male with past medical history of hypertension and new diagnosis of HFrEF presenting to the emergency department with DOE and worsening edema in his BLE and scrotum.  Patient diagnosed with HFrEF on 3/2 and found to have EF 25 to 30% with global hypokinesis, severe LVH, mild MR, small pericardial effusion, and mildly dilated aortic root.  After diagnosis patient has only been taking Lasix 20 mg daily.  We have been consulted for further HFrEF management.   Review of Systems: Denies chest pain.  Continues to have mild DOE, but improved.  Continues to have complaints of BLE and scrotal edema but states it is much improved from before.   Past Medical History:  Diagnosis Date  . Hyperlipidemia   . Hypertension   . Peripheral edema     (Not in a hospital admission)    . furosemide  60 mg Intravenous Daily  . heparin  5,000 Units Subcutaneous Q8H  . metoprolol succinate  50 mg Oral Daily  . potassium chloride  40 mEq Oral Daily  . [START ON 02/04/2021] sacubitril-valsartan  1 tablet Oral BID  . sodium chloride flush  3 mL Intravenous Q12H    Infusions: . sodium chloride      No Known Allergies  Social History   Socioeconomic History  . Marital status: Single    Spouse name: Not on file  . Number of children: Not on file  . Years of education: Not on file  . Highest education level: Not on file  Occupational History  . Not on file  Tobacco Use  . Smoking status: Never Smoker  . Smokeless tobacco: Never Used  Vaping Use  . Vaping Use: Never used  Substance and Sexual Activity  . Alcohol use: Not on file  . Drug use: Not on file  . Sexual activity: Not on file  Other Topics Concern  . Not on file  Social History Narrative   ** Merged History Encounter **       Social Determinants of Health   Financial Resource  Strain: Not on file  Food Insecurity: Not on file  Transportation Needs: Not on file  Physical Activity: Not on file  Stress: Not on file  Social Connections: Not on file  Intimate Partner Violence: Not on file    History reviewed. No pertinent family history.  PHYSICAL EXAM: Vitals:   02/03/21 0830 02/03/21 0930  BP: 111/80 116/77  Pulse: 95 (!) 102  Resp: 20 (!) 21  Temp:    SpO2: 98% 97%     Intake/Output Summary (Last 24 hours) at 02/03/2021 1109 Last data filed at 02/03/2021 0434 Gross per 24 hour  Intake 100 ml  Output 1500 ml  Net -1400 ml    General:  Well appearing. No respiratory difficulty HEENT: normal Neck: supple. no JVD. Carotids 2+ bilat; no bruits. No lymphadenopathy or thryomegaly appreciated. Cor: PMI nondisplaced. Regular rate & rhythm. No rubs, gallops or murmurs. Lungs: clear Abdomen: soft, nontender, nondistended. No hepatosplenomegaly. No bruits or masses. Good bowel sounds. Extremities: +2 BLE pitting edema Neuro: alert & oriented x 3, cranial nerves grossly intact. moves all 4 extremities w/o difficulty. Affect pleasant.  ECG: NSR with PVC.  97/BPM  Results for orders placed or performed during the hospital encounter of 02/02/21 (from the past 24 hour(s))  CBC     Status:  Abnormal   Collection Time: 02/02/21  1:59 PM  Result Value Ref Range   WBC 3.3 (L) 4.0 - 10.5 K/uL   RBC 5.01 4.22 - 5.81 MIL/uL   Hemoglobin 14.3 13.0 - 17.0 g/dL   HCT 06.3 01.6 - 01.0 %   MCV 84.2 80.0 - 100.0 fL   MCH 28.5 26.0 - 34.0 pg   MCHC 33.9 30.0 - 36.0 g/dL   RDW 93.2 (H) 35.5 - 73.2 %   Platelets 241 150 - 400 K/uL   nRBC 0.0 0.0 - 0.2 %  Basic metabolic panel     Status: Abnormal   Collection Time: 02/02/21  1:59 PM  Result Value Ref Range   Sodium 125 (L) 135 - 145 mmol/L   Potassium 3.0 (L) 3.5 - 5.1 mmol/L   Chloride 89 (L) 98 - 111 mmol/L   CO2 22 22 - 32 mmol/L   Glucose, Bld 127 (H) 70 - 99 mg/dL   BUN 12 8 - 23 mg/dL   Creatinine, Ser 2.02  (H) 0.61 - 1.24 mg/dL   Calcium 9.2 8.9 - 54.2 mg/dL   GFR, Estimated 51 (L) >60 mL/min   Anion gap 14 5 - 15  Brain natriuretic peptide     Status: Abnormal   Collection Time: 02/02/21  1:59 PM  Result Value Ref Range   B Natriuretic Peptide 449.3 (H) 0.0 - 100.0 pg/mL  Troponin I (High Sensitivity)     Status: Abnormal   Collection Time: 02/02/21  3:56 PM  Result Value Ref Range   Troponin I (High Sensitivity) 46 (H) <18 ng/L  Resp Panel by RT-PCR (Flu A&B, Covid) Nasopharyngeal Swab     Status: None   Collection Time: 02/02/21  6:12 PM   Specimen: Nasopharyngeal Swab; Nasopharyngeal(NP) swabs in vial transport medium  Result Value Ref Range   SARS Coronavirus 2 by RT PCR NEGATIVE NEGATIVE   Influenza A by PCR NEGATIVE NEGATIVE   Influenza B by PCR NEGATIVE NEGATIVE  HIV Antibody (routine testing w rflx)     Status: None   Collection Time: 02/02/21 11:35 PM  Result Value Ref Range   HIV Screen 4th Generation wRfx Non Reactive Non Reactive  TSH     Status: None   Collection Time: 02/02/21 11:35 PM  Result Value Ref Range   TSH 2.333 0.350 - 4.500 uIU/mL  Troponin I (High Sensitivity)     Status: Abnormal   Collection Time: 02/02/21 11:35 PM  Result Value Ref Range   Troponin I (High Sensitivity) 55 (H) <18 ng/L  CBC WITH DIFFERENTIAL     Status: Abnormal   Collection Time: 02/03/21  4:38 AM  Result Value Ref Range   WBC 3.0 (L) 4.0 - 10.5 K/uL   RBC 5.00 4.22 - 5.81 MIL/uL   Hemoglobin 14.4 13.0 - 17.0 g/dL   HCT 70.6 23.7 - 62.8 %   MCV 83.2 80.0 - 100.0 fL   MCH 28.8 26.0 - 34.0 pg   MCHC 34.6 30.0 - 36.0 g/dL   RDW 31.5 (H) 17.6 - 16.0 %   Platelets 223 150 - 400 K/uL   nRBC 0.0 0.0 - 0.2 %   Neutrophils Relative % 62 %   Neutro Abs 1.9 1.7 - 7.7 K/uL   Lymphocytes Relative 19 %   Lymphs Abs 0.6 (L) 0.7 - 4.0 K/uL   Monocytes Relative 14 %   Monocytes Absolute 0.4 0.1 - 1.0 K/uL   Eosinophils Relative 3 %   Eosinophils Absolute  0.1 0.0 - 0.5 K/uL   Basophils  Relative 2 %   Basophils Absolute 0.1 0.0 - 0.1 K/uL   Immature Granulocytes 0 %   Abs Immature Granulocytes 0.01 0.00 - 0.07 K/uL  Basic metabolic panel     Status: Abnormal   Collection Time: 02/03/21  7:14 AM  Result Value Ref Range   Sodium 124 (L) 135 - 145 mmol/L   Potassium 4.4 3.5 - 5.1 mmol/L   Chloride 89 (L) 98 - 111 mmol/L   CO2 22 22 - 32 mmol/L   Glucose, Bld 99 70 - 99 mg/dL   BUN 11 8 - 23 mg/dL   Creatinine, Ser 3.01 (H) 0.61 - 1.24 mg/dL   Calcium 9.2 8.9 - 60.1 mg/dL   GFR, Estimated 46 (L) >60 mL/min   Anion gap 13 5 - 15  Osmolality     Status: Abnormal   Collection Time: 02/03/21  7:14 AM  Result Value Ref Range   Osmolality 264 (L) 275 - 295 mOsm/kg  Magnesium     Status: None   Collection Time: 02/03/21  7:14 AM  Result Value Ref Range   Magnesium 2.2 1.7 - 2.4 mg/dL   DG Chest 2 View  Result Date: 02/02/2021 CLINICAL DATA:  Cough, shortness of breath. EXAM: CHEST - 2 VIEW COMPARISON:  December 11, 2020. FINDINGS: Stable cardiomediastinal silhouette. No pneumothorax or pleural effusion is noted. Both lungs are clear. The visualized skeletal structures are unremarkable. IMPRESSION: No active cardiopulmonary disease. Electronically Signed   By: Lupita Raider M.D.   On: 02/02/2021 18:26     ASSESSMENT AND PLAN: Patient presenting to the emergency department with worsening dyspnea as well as BLE and scrotal edema.  With patient's new diagnosis of HFrEF we will start the patient on metoprolol succinate 50 mg daily and will start the patient on Entresto tomorrow as patient had home dose of benazepril yesterday.  Creatinine 1.62 this morning and therefore will hold on spironolactone.  We will consider starting the patient on SGLT2 tomorrow as well.  Recommend continuing IV Lasix as patient continues to be fluid overloaded. Strict I's and O's please  Mildly elevated troponin most likely due to demand ischemia from HFrEF diagnosis. Will continue to follow.  Maryelizabeth Kaufmann NP-C

## 2021-02-03 NOTE — ED Notes (Signed)
Pt given new urinal to collect urine sample with next void

## 2021-02-04 DIAGNOSIS — I5021 Acute systolic (congestive) heart failure: Secondary | ICD-10-CM

## 2021-02-04 LAB — BASIC METABOLIC PANEL
Anion gap: 11 (ref 5–15)
BUN: 13 mg/dL (ref 8–23)
CO2: 23 mmol/L (ref 22–32)
Calcium: 9.3 mg/dL (ref 8.9–10.3)
Chloride: 91 mmol/L — ABNORMAL LOW (ref 98–111)
Creatinine, Ser: 1.85 mg/dL — ABNORMAL HIGH (ref 0.61–1.24)
GFR, Estimated: 39 mL/min — ABNORMAL LOW (ref >60)
Glucose, Bld: 90 mg/dL (ref 70–99)
Potassium: 4.1 mmol/L (ref 3.5–5.1)
Sodium: 125 mmol/L — ABNORMAL LOW (ref 135–145)

## 2021-02-04 LAB — CBC
HCT: 40.6 % (ref 39.0–52.0)
Hemoglobin: 13.7 g/dL (ref 13.0–17.0)
MCH: 28.4 pg (ref 26.0–34.0)
MCHC: 33.7 g/dL (ref 30.0–36.0)
MCV: 84.1 fL (ref 80.0–100.0)
Platelets: 220 10*3/uL (ref 150–400)
RBC: 4.83 MIL/uL (ref 4.22–5.81)
RDW: 16.2 % — ABNORMAL HIGH (ref 11.5–15.5)
WBC: 2.8 10*3/uL — ABNORMAL LOW (ref 4.0–10.5)
nRBC: 0 % (ref 0.0–0.2)

## 2021-02-04 LAB — MAGNESIUM: Magnesium: 2 mg/dL (ref 1.7–2.4)

## 2021-02-04 LAB — GLUCOSE, CAPILLARY: Glucose-Capillary: 95 mg/dL (ref 70–99)

## 2021-02-04 LAB — PHOSPHORUS: Phosphorus: 3.5 mg/dL (ref 2.5–4.6)

## 2021-02-04 MED ORDER — FUROSEMIDE 40 MG PO TABS: 40.0000 mg | ORAL_TABLET | Freq: Every day | ORAL | Status: DC

## 2021-02-04 MED ORDER — FUROSEMIDE 10 MG/ML IJ SOLN
40.0000 mg | Freq: Two times a day (BID) | INTRAMUSCULAR | Status: DC
  Administered 2021-02-04 – 2021-02-09 (×10): 40 mg via INTRAVENOUS
  Filled 2021-02-04 (×10): qty 4

## 2021-02-04 MED ORDER — DAPAGLIFLOZIN PROPANEDIOL 5 MG PO TABS
10.0000 mg | ORAL_TABLET | Freq: Every day | ORAL | Status: DC
  Administered 2021-02-04 – 2021-02-09 (×6): 10 mg via ORAL
  Filled 2021-02-04 (×7): qty 2

## 2021-02-04 MED ORDER — DOPAMINE-DEXTROSE 3.2-5 MG/ML-% IV SOLN
3.0000 ug/kg/min | INTRAVENOUS | Status: DC
  Administered 2021-02-04 – 2021-02-07 (×3): 3 ug/kg/min via INTRAVENOUS
  Filled 2021-02-04 (×3): qty 250

## 2021-02-04 NOTE — Progress Notes (Signed)
Mobility Specialist - Progress Note   02/04/21 1400  Mobility  Activity Refused mobility  Mobility performed by Mobility specialist    Pt motivated for ambulation, but politely declined mobility d/t not having knee braces available in room this date. Pt states that he ambulated to bathroom earlier with difficulty d/t osteoarthritis pain. Pt is in contact with family to bring braces to room. Will attempt session at another date/time.    Tyler Ochoa Mobility Specialist 02/04/21, 2:45 PM

## 2021-02-04 NOTE — Progress Notes (Signed)
SUBJECTIVE: Patient resting comfortably in bed. Denies chest pain or shortness of breath. Feels that his swelling continues to improve.   Vitals:   02/04/21 0521 02/04/21 0600 02/04/21 0805 02/04/21 1213  BP: 104/72  90/70 104/73  Pulse: 82  77 80  Resp: 20  17 17   Temp: 98.5 F (36.9 C)  97.7 F (36.5 C) 98.1 F (36.7 C)  TempSrc: Oral  Oral Oral  SpO2: 94%  95% 92%  Weight:  (!) 138.4 kg    Height:        Intake/Output Summary (Last 24 hours) at 02/04/2021 1213 Last data filed at 02/04/2021 1000 Gross per 24 hour  Intake 20 ml  Output 925 ml  Net -905 ml    LABS: Basic Metabolic Panel: Recent Labs    02/03/21 0714 02/04/21 0558  NA 124* 125*  K 4.4 4.1  CL 89* 91*  CO2 22 23  GLUCOSE 99 90  BUN 11 13  CREATININE 1.62* 1.85*  CALCIUM 9.2 9.3  MG 2.2 2.0  PHOS  --  3.5   Liver Function Tests: No results for input(s): AST, ALT, ALKPHOS, BILITOT, PROT, ALBUMIN in the last 72 hours. No results for input(s): LIPASE, AMYLASE in the last 72 hours. CBC: Recent Labs    02/03/21 0438 02/04/21 0558  WBC 3.0* 2.8*  NEUTROABS 1.9  --   HGB 14.4 13.7  HCT 41.6 40.6  MCV 83.2 84.1  PLT 223 220   Cardiac Enzymes: No results for input(s): CKTOTAL, CKMB, CKMBINDEX, TROPONINI in the last 72 hours. BNP: Invalid input(s): POCBNP D-Dimer: No results for input(s): DDIMER in the last 72 hours. Hemoglobin A1C: No results for input(s): HGBA1C in the last 72 hours. Fasting Lipid Panel: No results for input(s): CHOL, HDL, LDLCALC, TRIG, CHOLHDL, LDLDIRECT in the last 72 hours. Thyroid Function Tests: Recent Labs    02/02/21 2335  TSH 2.333   Anemia Panel: No results for input(s): VITAMINB12, FOLATE, FERRITIN, TIBC, IRON, RETICCTPCT in the last 72 hours.   PHYSICAL EXAM General: Well developed, well nourished, in no acute distress HEENT:  Normocephalic and atramatic Neck:  No JVD.  Lungs: Clear bilaterally to auscultation and percussion. Heart: HRRR . Normal S1  and S2 without gallops or murmurs.  Abdomen: Bowel sounds are positive, abdomen soft and non-tender  Msk:  Back normal, normal gait. Normal strength and tone for age. Extremities: BLE +1 pitting edema. Non pitting scrotal edema.   Neuro: Alert and oriented X 3. Psych:  Good affect, responds appropriately  TELEMETRY: NSR with PVCs. 82/bpm   ASSESSMENT AND PLAN: Patient presenting to the emergency department with worsening dyspnea as well as BLE and scrotal edema.  With new diagnosis of HFrEF please continue metoprolol succinate 50 mg daily,  Entresto, and start 2336 today. Reached out to nurse navigator for further bedside education.  Creatinine still elevated, therefore will hold on spironolactone.  Recommend transitioning to oral lasix dosing starting tomorrow. Patient down 12lbs since admission. Continue Strict I's and O's please. Recommend cardiac rehab on discharge. Will continue to follow.  Principal Problem:   Acute exacerbation of CHF (congestive heart failure) (HCC) Active Problems:   Essential hypertension   Hyperlipidemia   Truncal obesity   Osteoarthritis of both knees    [place], NP-C 02/04/2021 12:13 PM

## 2021-02-04 NOTE — Consult Note (Addendum)
   Heart Failure Nurse Navigator Note  HFrEF 25 to 30%.  Severe concentric LVH.  Severe biatrial enlargement.  Right ventricular systolic function is severely decreased.  He presented to the emergency room with complaints of worsening shortness of breath, lower extremity edema extending to his scrotal area.  Comorbidities:  Hypertension Hyperlipidemia Osteoarthritis  Medications:  Farxiga 10 mg daily Metoprolol succinate 50 mg daily Entresto 24/26 mg 2 times a day  He is to start Lasix 40 mg by mouth.  Labs:  Sodium 125, potassium 4.1, chloride 91, CO2 23, BUN 13, creatinine 1.85 up from 1.62 of yesterday.  Magnesium 2.0, phosphorus 3.5. Weight is 134.9 kg yesterday was 140.2 IN take appears incomplete as 20 mL  Output 625 mL  Social-he lives with his girlfriend.  Assessment:  General-he is awake and alert sitting up at the bedside.  In no acute distress.  HEENT-normocephalic, no JVD.  Cardiac-heart tones of regular rate and rhythm no murmurs rubs or gallops appreciated.  Chest-breath sounds are clear to posterior auscultation  Abdomen-soft rounded nontender.  Extremities-with pitting 2+ edema extending to his thighs, with scrotal edema also.  Psych-is pleasant and appropriate makes good eye contact, is very talkative.  Neurologic-speech is clear, moves all extremities without difficulty.    Initial meeting with patient, his girlfriend was also present.  He states that he understands the  decreased function of his heart.  He states earlier in the year he had been sick for approximately 2 weeks, unsure if it was Covid but felt that if he did not improve that he was going to surely die.  He states then after that time that he noticed a weight gain, he said he normally  runs between 265 and 275 and all of a sudden he was up to 326 and was swollen all over.  Voices that he is very upset with his PCP for not getting back to him with the results of his  echocardiogram.  Discussed how he is going to take care of himself as an outpatient, went over daily weights and recording, what to report.  Also discussed changes in diet and fluid restriction.  He voices understanding.  He realizes now with the condition of his heart that he is going to have to give up his beer.  Also discussed using natural herbs for seasoning along with Mrs. Dash.  Discussed his new medications, Sherryll Burger, Farxiga and metoprolol succinate.  Also talked about follow-up with the heart failure clinic.  He was given an appointment for April 26.  He had no further questions, told him that I would follow along with him tomorrow.  Tresa Endo RN [facility]

## 2021-02-04 NOTE — Progress Notes (Signed)
I have reviewed the charting completed by the student nurse and I agree with her assessments.  Margee Trentham A Quavis Klutz, RN 

## 2021-02-04 NOTE — Plan of Care (Signed)
  Problem: Education: Goal: Knowledge of General Education information will improve Description: Including pain rating scale, medication(s)/side effects and non-pharmacologic comfort measures 02/04/2021 1311 by Ansel Bong, RN Outcome: Progressing 02/04/2021 1311 by Ansel Bong, RN Outcome: Progressing   Problem: Health Behavior/Discharge Planning: Goal: Ability to manage health-related needs will improve 02/04/2021 1311 by Ansel Bong, RN Outcome: Progressing 02/04/2021 1311 by Ansel Bong, RN Outcome: Progressing   Problem: Clinical Measurements: Goal: Ability to maintain clinical measurements within normal limits will improve 02/04/2021 1311 by Ansel Bong, RN Outcome: Progressing 02/04/2021 1311 by Ansel Bong, RN Outcome: Progressing Goal: Will remain free from infection 02/04/2021 1311 by Ansel Bong, RN Outcome: Progressing 02/04/2021 1311 by Ansel Bong, RN Outcome: Progressing Goal: Diagnostic test results will improve 02/04/2021 1311 by Ansel Bong, RN Outcome: Progressing 02/04/2021 1311 by Ansel Bong, RN Outcome: Progressing Goal: Respiratory complications will improve 02/04/2021 1311 by Ansel Bong, RN Outcome: Progressing 02/04/2021 1311 by Ansel Bong, RN Outcome: Progressing Goal: Cardiovascular complication will be avoided 02/04/2021 1311 by Ansel Bong, RN Outcome: Progressing 02/04/2021 1311 by Ansel Bong, RN Outcome: Progressing   Problem: Activity: Goal: Risk for activity intolerance will decrease 02/04/2021 1311 by Ansel Bong, RN Outcome: Progressing 02/04/2021 1311 by Ansel Bong, RN Outcome: Progressing   Problem: Nutrition: Goal: Adequate nutrition will be maintained 02/04/2021 1311 by Ansel Bong, RN Outcome: Progressing 02/04/2021 1311 by Ansel Bong, RN Outcome: Progressing   Problem: Coping: Goal: Level of anxiety will decrease 02/04/2021 1311 by Ansel Bong, RN Outcome:  Progressing 02/04/2021 1311 by Ansel Bong, RN Outcome: Progressing   Problem: Elimination: Goal: Will not experience complications related to bowel motility 02/04/2021 1311 by Ansel Bong, RN Outcome: Progressing 02/04/2021 1311 by Ansel Bong, RN Outcome: Progressing Goal: Will not experience complications related to urinary retention 02/04/2021 1311 by Ansel Bong, RN Outcome: Progressing 02/04/2021 1311 by Ansel Bong, RN Outcome: Progressing   Problem: Pain Managment: Goal: General experience of comfort will improve 02/04/2021 1311 by Ansel Bong, RN Outcome: Progressing 02/04/2021 1311 by Ansel Bong, RN Outcome: Progressing   Problem: Safety: Goal: Ability to remain free from injury will improve 02/04/2021 1311 by Ansel Bong, RN Outcome: Progressing 02/04/2021 1311 by Ansel Bong, RN Outcome: Progressing   Problem: Skin Integrity: Goal: Risk for impaired skin integrity will decrease 02/04/2021 1311 by Ansel Bong, RN Outcome: Progressing 02/04/2021 1311 by Ansel Bong, RN Outcome: Progressing

## 2021-02-04 NOTE — Progress Notes (Signed)
PROGRESS NOTE    BEDFORD WINSOR  LEX:[id number] DOB: 1951-07-09 DOA: 02/02/2021 PCP: Queen Slough, MD    Brief Narrative:  Tyler Ochoa is a 70 y.o. male with medical history significant for hypertension, heart failure reduced ejection fraction, is a new diagnosis, presents to the emergency department for chief concerns of shortness of breath that is worse with exertion.Found with CHF.  Covid, influenza A, influenza B negative  3/30-feels better today. Still volume overloaded Decrease UO since last night per pt.    Consultants:   cardiology  Procedures:   Antimicrobials:       Subjective: As above. No cp  Objective: Vitals:   02/04/21 0500 02/04/21 0521 02/04/21 0600 02/04/21 0805  BP:  104/72  90/70  Pulse:  82  77  Resp:  20  17  Temp:  98.5 F (36.9 C)  97.7 F (36.5 C)  TempSrc:  Oral  Oral  SpO2:  94%  95%  Weight: (!) 139.9 kg  (!) 138.4 kg   Height:        Intake/Output Summary (Last 24 hours) at 02/04/2021 0825 Last data filed at 02/04/2021 4496 Gross per 24 hour  Intake 20 ml  Output 625 ml  Net -605 ml   Filed Weights   02/03/21 1456 02/04/21 0500 02/04/21 0600  Weight: (!) 140.2 kg (!) 139.9 kg (!) 138.4 kg    Examination:  General exam: Appears calm and comfortable  Respiratory system: decrease bs at bases Cardiovascular system: S1 & S2 heard, RRR. No JVD, murmurs, rubs, gallops or clicks. +JVD Gastrointestinal system: Abdomen is nondistended, soft and nontender. Normal bowel sounds heard. Central nervous system: Alert and oriented. Grossly intact Extremities: 2+ pitting edema Skin: warm, dry Psychiatry: Judgement and insight appear normal. Mood & affect appropriate.     Data Reviewed: I have personally reviewed following labs and imaging studies  CBC: Recent Labs  Lab 02/02/21 1359 02/03/21 0438 02/04/21 0558  WBC 3.3* 3.0* 2.8*  NEUTROABS  --  1.9  --   HGB 14.3 14.4 13.7  HCT 42.2 41.6 40.6  MCV 84.2 83.2 84.1  PLT  241 223 220   Basic Metabolic Panel: Recent Labs  Lab 02/02/21 1359 02/03/21 0714 02/04/21 0558  NA 125* 124* 125*  K 3.0* 4.4 4.1  CL 89* 89* 91*  CO2 22 22 23   GLUCOSE 127* 99 90  BUN 12 11 13   CREATININE 1.50* 1.62* 1.85*  CALCIUM 9.2 9.2 9.3  MG  --  2.2 2.0  PHOS  --   --  3.5   GFR: Estimated Creatinine Clearance: 57.4 mL/min (A) (by C-G formula based on SCr of 1.85 mg/dL (H)). Liver Function Tests: No results for input(s): AST, ALT, ALKPHOS, BILITOT, PROT, ALBUMIN in the last 168 hours. No results for input(s): LIPASE, AMYLASE in the last 168 hours. No results for input(s): AMMONIA in the last 168 hours. Coagulation Profile: No results for input(s): INR, PROTIME in the last 168 hours. Cardiac Enzymes: No results for input(s): CKTOTAL, CKMB, CKMBINDEX, TROPONINI in the last 168 hours. BNP (last 3 results) No results for input(s): PROBNP in the last 8760 hours. HbA1C: No results for input(s): HGBA1C in the last 72 hours. CBG: Recent Labs  Lab 02/04/21 0802  GLUCAP 95   Lipid Profile: No results for input(s): CHOL, HDL, LDLCALC, TRIG, CHOLHDL, LDLDIRECT in the last 72 hours. Thyroid Function Tests: Recent Labs    02/02/21 2335  TSH 2.333   Anemia Panel: No results for  input(s): VITAMINB12, FOLATE, FERRITIN, TIBC, IRON, RETICCTPCT in the last 72 hours. Sepsis Labs: No results for input(s): PROCALCITON, LATICACIDVEN in the last 168 hours.  Recent Results (from the past 240 hour(s))  Resp Panel by RT-PCR (Flu A&B, Covid) Nasopharyngeal Swab     Status: None   Collection Time: 02/02/21  6:12 PM   Specimen: Nasopharyngeal Swab; Nasopharyngeal(NP) swabs in vial transport medium  Result Value Ref Range Status   SARS Coronavirus 2 by RT PCR NEGATIVE NEGATIVE Final    Comment: (NOTE) SARS-CoV-2 target nucleic acids are NOT DETECTED.  The SARS-CoV-2 RNA is generally detectable in upper respiratory specimens during the acute phase of infection. The  lowest concentration of SARS-CoV-2 viral copies this assay can detect is 138 copies/mL. A negative result does not preclude SARS-Cov-2 infection and should not be used as the sole basis for treatment or other patient management decisions. A negative result may occur with  improper specimen collection/handling, submission of specimen other than nasopharyngeal swab, presence of viral mutation(s) within the areas targeted by this assay, and inadequate number of viral copies(<138 copies/mL). A negative result must be combined with clinical observations, patient history, and epidemiological information. The expected result is Negative.  Fact Sheet for Patients:  BloggerCourse.com  Fact Sheet for Healthcare Providers:  SeriousBroker.it  This test is no t yet approved or cleared by the [place] FDA and  has been authorized for detection and/or diagnosis of SARS-CoV-2 by FDA under an Emergency Use Authorization (EUA). This EUA will remain  in effect (meaning this test can be used) for the duration of the COVID-19 declaration under Section 564(b)(1) of the Act, 21 U.S.C.section 360bbb-3(b)(1), unless the authorization is terminated  or revoked sooner.       Influenza A by PCR NEGATIVE NEGATIVE Final   Influenza B by PCR NEGATIVE NEGATIVE Final    Comment: (NOTE) The Xpert Xpress SARS-CoV-2/FLU/RSV plus assay is intended as an aid in the diagnosis of influenza from Nasopharyngeal swab specimens and should not be used as a sole basis for treatment. Nasal washings and aspirates are unacceptable for Xpert Xpress SARS-CoV-2/FLU/RSV testing.  Fact Sheet for Patients: BloggerCourse.com  Fact Sheet for Healthcare Providers: SeriousBroker.it  This test is not yet approved or cleared by the [place] FDA and has been authorized for detection and/or diagnosis of SARS-CoV-2 by FDA under  an Emergency Use Authorization (EUA). This EUA will remain in effect (meaning this test can be used) for the duration of the COVID-19 declaration under Section 564(b)(1) of the Act, 21 U.S.C. section 360bbb-3(b)(1), unless the authorization is terminated or revoked.  Performed at [facility], [address] [place], [place] [address]          Radiology Studies: DG Chest 2 View  Result Date: 02/02/2021 CLINICAL DATA:  Cough, shortness of breath. EXAM: CHEST - 2 VIEW COMPARISON:  December 11, 2020. FINDINGS: Stable cardiomediastinal silhouette. No pneumothorax or pleural effusion is noted. Both lungs are clear. The visualized skeletal structures are unremarkable. IMPRESSION: No active cardiopulmonary disease. Electronically Signed   By: Lupita Raider M.D.   On: 02/02/2021 18:26        Scheduled Meds: . furosemide  60 mg Intravenous Daily  . heparin  5,000 Units Subcutaneous Q8H  . metoprolol succinate  50 mg Oral Daily  . sacubitril-valsartan  1 tablet Oral BID  . sodium chloride flush  3 mL Intravenous Q12H   Continuous Infusions: . sodium chloride      Assessment &  Plan:   Principal Problem:   Acute exacerbation of CHF (congestive heart failure) (HCC) Active Problems:   Essential hypertension   Hyperlipidemia   Truncal obesity   Osteoarthritis of both knees  # Acute biventricular failure Still volume overloaded Echo 01/07/21 rv systolic function severely depressed and EF 25-30% Was on lasix iv , cards changed to 40mg  po qd. UO appears to be down. Will d/w cards about ?ionotope as he is still vol. Overloaded..Messaged Dr. and NP Welton Flakes. Ck bnp-might be underestimated for his wt. 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  Creatinine going up mildly , today 1.85 Possibly componenet  cardiorenal syndrome Continue to monitor I/o    DVT prophylaxis: heparin Code Status:full Family Communication: none at bedside  Status is: Inpatient  Remains inpatient appropriate because:IV treatments appropriate due to intensity of illness or inability to take PO   Dispo:  Patient From: Home  Planned Disposition: Home  Medically stable for discharge: No              LOS: 1 day   Time spent: 35 min with >50% on coc    Beryle Beams, MD [facility] Pager [phone]  If 7PM-7AM, please contact night-coverage 02/04/2021, 8:25 AM

## 2021-02-05 LAB — BASIC METABOLIC PANEL
Anion gap: 12 (ref 5–15)
BUN: 15 mg/dL (ref 8–23)
CO2: 25 mmol/L (ref 22–32)
Calcium: 9.2 mg/dL (ref 8.9–10.3)
Chloride: 91 mmol/L — ABNORMAL LOW (ref 98–111)
Creatinine, Ser: 1.74 mg/dL — ABNORMAL HIGH (ref 0.61–1.24)
GFR, Estimated: 42 mL/min — ABNORMAL LOW (ref >60)
Glucose, Bld: 105 mg/dL — ABNORMAL HIGH (ref 70–99)
Potassium: 3.2 mmol/L — ABNORMAL LOW (ref 3.5–5.1)
Sodium: 128 mmol/L — ABNORMAL LOW (ref 135–145)

## 2021-02-05 LAB — BRAIN NATRIURETIC PEPTIDE: B Natriuretic Peptide: 1011.5 pg/mL — ABNORMAL HIGH (ref 0.0–100.0)

## 2021-02-05 MED ORDER — POTASSIUM CHLORIDE CRYS ER 20 MEQ PO TBCR
40.0000 meq | EXTENDED_RELEASE_TABLET | Freq: Two times a day (BID) | ORAL | Status: DC
  Administered 2021-02-05 – 2021-02-09 (×9): 40 meq via ORAL
  Filled 2021-02-05 (×9): qty 2

## 2021-02-05 NOTE — Plan of Care (Signed)
  Problem: Education: Goal: Knowledge of General Education information will improve Description: Including pain rating scale, medication(s)/side effects and non-pharmacologic comfort measures 02/05/2021 1033 by Ansel Bong, RN Outcome: Progressing 02/05/2021 1033 by Ansel Bong, RN Outcome: Progressing   Problem: Health Behavior/Discharge Planning: Goal: Ability to manage health-related needs will improve 02/05/2021 1033 by Ansel Bong, RN Outcome: Progressing 02/05/2021 1033 by Ansel Bong, RN Outcome: Progressing   Problem: Clinical Measurements: Goal: Ability to maintain clinical measurements within normal limits will improve 02/05/2021 1033 by Ansel Bong, RN Outcome: Progressing 02/05/2021 1033 by Ansel Bong, RN Outcome: Progressing Goal: Will remain free from infection 02/05/2021 1033 by Ansel Bong, RN Outcome: Progressing 02/05/2021 1033 by Ansel Bong, RN Outcome: Progressing Goal: Diagnostic test results will improve 02/05/2021 1033 by Ansel Bong, RN Outcome: Progressing 02/05/2021 1033 by Ansel Bong, RN Outcome: Progressing Goal: Respiratory complications will improve 02/05/2021 1033 by Ansel Bong, RN Outcome: Progressing 02/05/2021 1033 by Ansel Bong, RN Outcome: Progressing Goal: Cardiovascular complication will be avoided 02/05/2021 1033 by Ansel Bong, RN Outcome: Progressing 02/05/2021 1033 by Ansel Bong, RN Outcome: Progressing   Problem: Activity: Goal: Risk for activity intolerance will decrease 02/05/2021 1033 by Ansel Bong, RN Outcome: Progressing 02/05/2021 1033 by Ansel Bong, RN Outcome: Progressing   Problem: Nutrition: Goal: Adequate nutrition will be maintained 02/05/2021 1033 by Ansel Bong, RN Outcome: Progressing 02/05/2021 1033 by Ansel Bong, RN Outcome: Progressing   Problem: Coping: Goal: Level of anxiety will decrease 02/05/2021 1033 by Ansel Bong, RN Outcome:  Progressing 02/05/2021 1033 by Ansel Bong, RN Outcome: Progressing   Problem: Elimination: Goal: Will not experience complications related to bowel motility 02/05/2021 1033 by Ansel Bong, RN Outcome: Progressing 02/05/2021 1033 by Ansel Bong, RN Outcome: Progressing Goal: Will not experience complications related to urinary retention 02/05/2021 1033 by Ansel Bong, RN Outcome: Progressing 02/05/2021 1033 by Ansel Bong, RN Outcome: Progressing   Problem: Pain Managment: Goal: General experience of comfort will improve 02/05/2021 1033 by Ansel Bong, RN Outcome: Progressing 02/05/2021 1033 by Ansel Bong, RN Outcome: Progressing   Problem: Safety: Goal: Ability to remain free from injury will improve 02/05/2021 1033 by Ansel Bong, RN Outcome: Progressing 02/05/2021 1033 by Ansel Bong, RN Outcome: Progressing   Problem: Skin Integrity: Goal: Risk for impaired skin integrity will decrease 02/05/2021 1033 by Ansel Bong, RN Outcome: Progressing 02/05/2021 1033 by Ansel Bong, RN Outcome: Progressing

## 2021-02-05 NOTE — Progress Notes (Signed)
SUBJECTIVE: Patient resting comfortably on the edge of the bed. Denies dyspnea or chest pain. Did not work with PT yesterday d/t Knee issues.   Vitals:   02/04/21 2107 02/05/21 0450 02/05/21 0500 02/05/21 0800  BP: 103/77 106/71  108/74  Pulse: 88 84  87  Resp:  18  18  Temp: 98.2 F (36.8 C) 97.9 F (36.6 C)  98.6 F (37 C)  TempSrc: Oral Oral  Oral  SpO2: 96% 91%  100%  Weight:   (!) 136.9 kg   Height:        Intake/Output Summary (Last 24 hours) at 02/05/2021 1026 Last data filed at 02/05/2021 0800 Gross per 24 hour  Intake 409.68 ml  Output 3200 ml  Net -2790.32 ml    LABS: Basic Metabolic Panel: Recent Labs    02/03/21 0714 02/04/21 0558 02/05/21 0415  NA 124* 125* 128*  K 4.4 4.1 3.2*  CL 89* 91* 91*  CO2 22 23 25   GLUCOSE 99 90 105*  BUN 11 13 15   CREATININE 1.62* 1.85* 1.74*  CALCIUM 9.2 9.3 9.2  MG 2.2 2.0  --   PHOS  --  3.5  --    Liver Function Tests: No results for input(s): AST, ALT, ALKPHOS, BILITOT, PROT, ALBUMIN in the last 72 hours. No results for input(s): LIPASE, AMYLASE in the last 72 hours. CBC: Recent Labs    02/03/21 0438 02/04/21 0558  WBC 3.0* 2.8*  NEUTROABS 1.9  --   HGB 14.4 13.7  HCT 41.6 40.6  MCV 83.2 84.1  PLT 223 220   Cardiac Enzymes: No results for input(s): CKTOTAL, CKMB, CKMBINDEX, TROPONINI in the last 72 hours. BNP: Invalid input(s): POCBNP D-Dimer: No results for input(s): DDIMER in the last 72 hours. Hemoglobin A1C: No results for input(s): HGBA1C in the last 72 hours. Fasting Lipid Panel: No results for input(s): CHOL, HDL, LDLCALC, TRIG, CHOLHDL, LDLDIRECT in the last 72 hours. Thyroid Function Tests: Recent Labs    02/02/21 2335  TSH 2.333   Anemia Panel: No results for input(s): VITAMINB12, FOLATE, FERRITIN, TIBC, IRON, RETICCTPCT in the last 72 hours.   PHYSICAL EXAM General: Well developed, well nourished, in no acute distress HEENT:  Normocephalic and atramatic Neck:  No JVD.  Lungs:  Clear bilaterally to auscultation and percussion. Heart: HRRR . Normal S1 and S2 without gallops or murmurs.  Abdomen: Bowel sounds are positive, abdomen soft and non-tender  Msk:  Back normal, normal gait. Normal strength and tone for age. Extremities: +2 BLE pitting edema. Non pitting scrotal edema  Neuro: Alert and oriented X 3. Psych:  Good affect, responds appropriately  TELEMETRY: NSR. 86/bpm  ASSESSMENT AND PLAN: Patient presenting to the emergency department with worsening dyspnea as well as BLE and scrotal edema. For HFrEF please continue metoprolol succinate 50 mg daily,  Entresto, and 02/04/21 today. It was decided yesterday with patient's continued edema and low urine output to start dopamine infusion with IV lasix 40mg  bid. Patient has since had increased urine output with downtrending Cr. Continue dopamine infusion for now and continue to monitor urine output, Continue Strict I's and O's please.  Will continue to follow.  Principal Problem:   Acute exacerbation of CHF (congestive heart failure) (HCC) Active Problems:   Essential hypertension   Hyperlipidemia   Truncal obesity   Osteoarthritis of both knees    2336, NP-C 02/05/2021 10:26 AM

## 2021-02-05 NOTE — Consult Note (Addendum)
   Heart Failure Nurse Navigator Note  HFrEF 25 to 30%.  Severe concentric LVH.  Severe biatrial enlargement.  Right ventricular systolic function is severely decreased.  He presented to the emergency room with complaints of worsening shortness of breath, lower extremity edema extending to his scrotal area.  Comorbidities:  Hypertension Hyperlipidemia Osteoarthritis  Medications:  Farxiga 10 mg daily Dopamine infusion at 3 mcg/kg/min Furosemide 40 mg IV twice daily Metoprolol succinate 50 mg daily Potassium chloride 40 mEq 2 times a day Entresto 24/26 mg 2 times a day  Labs:  Sodium 128, potassium 3.2, chloride 91, CO2 25, BUN 15, creatinine 1.17, BNP 1011. Intake 309 mL Output 3300 mL Weight is 136.9 kg yesterday was 138.4 BMI is 38.7 Blood pressure 93/62  Assessment:  General-patient is awake and alert lying in bed in no acute distress.  HEENT-pupils are equal, normocephalic, no JVD noted  Cardiac-heart tones of regular rate and rhythm no murmurs rubs or gallops appreciated.  Chest-clear to auscultation   Abdomen-rounded soft non tender  Musculoskeletal-he continues to have pitting edema up to his thighs.  He states that his scrotum is still swollen but not as bad, not as uncomfortable.  Psych-is pleasant and appropriate makes good eye contact, is talkative.  Neurologic-speech is clear, moves all extremities without difficulty.   Met with patient today, his girlfriend was not present.  Again today discussed his medications and why he is on him.  States that pharmacy was in and talked with him and gave him a 30-day coupon for [facility].  Questioned if financially he could afford the Entresto if it cost him $400 a month.  He states that his girlfriend and him have been together for 18 years, he is retired he makes a little over thousand dollars a month.  States that his girlfriend is also on [employer] farm [employer].  In between the bills that they have the $400 would put a  strain on them.  Discussed with the pharmacy if he could qualify for patient assistance.  Eleonore Chiquito, the pharmacist states that he would print off the needed paperwork for applying for the assistance.  He had no further questions today, we will continue to follow along.  Pricilla Riffle RN [facility]

## 2021-02-05 NOTE — Progress Notes (Signed)
PROGRESS NOTE    Tyler Ochoa  LFY:[id number] DOB: 1951-09-16 DOA: 02/02/2021 PCP: Queen Slough, MD    Brief Narrative:  Tyler Ochoa is a 70 y.o. male with medical history significant for hypertension, heart failure reduced ejection fraction, is a new diagnosis, presents to the emergency department for chief concerns of shortness of breath that is worse with exertion.Found with CHF.  Covid, influenza A, influenza B negative  3/30-feels better today. Still volume overloaded Decrease UO since last night per pt.  3/31-scrotal edema improving some. Good UO .  Consultants:   cardiology  Procedures:   Antimicrobials:       Subjective: No sob. Still with LE edema, but scrotal edema improving   Objective: Vitals:   02/04/21 1502 02/04/21 2107 02/05/21 0450 02/05/21 0500  BP: (!) 86/66 103/77 106/71   Pulse: 78 88 84   Resp: 16  18   Temp: 98.5 F (36.9 C) 98.2 F (36.8 C) 97.9 F (36.6 C)   TempSrc: Oral Oral Oral   SpO2: 93% 96% 91%   Weight:    (!) 136.9 kg  Height:        Intake/Output Summary (Last 24 hours) at 02/05/2021 0817 Last data filed at 02/05/2021 0700 Gross per 24 hour  Intake 309.68 ml  Output 3300 ml  Net -2990.32 ml   Filed Weights   02/04/21 0500 02/04/21 0600 02/05/21 0500  Weight: (!) 139.9 kg (!) 138.4 kg (!) 136.9 kg    Examination: Calm, comfortable cta with decreased breath sounds at bases no wheezing Regular S1-S2 no gallops, +jvd Soft benign positive bowel sounds Scrotal edema present 3+ pitting edema bilaterally Alert oriented x3 grossly intact    Data Reviewed: I have personally reviewed following labs and imaging studies  CBC: Recent Labs  Lab 02/02/21 1359 02/03/21 0438 02/04/21 0558  WBC 3.3* 3.0* 2.8*  NEUTROABS  --  1.9  --   HGB 14.3 14.4 13.7  HCT 42.2 41.6 40.6  MCV 84.2 83.2 84.1  PLT 241 223 220   Basic Metabolic Panel: Recent Labs  Lab 02/02/21 1359 02/03/21 0714 02/04/21 0558  02/05/21 0415  NA 125* 124* 125* 128*  K 3.0* 4.4 4.1 3.2*  CL 89* 89* 91* 91*  CO2 22 22 23 25   GLUCOSE 127* 99 90 105*  BUN 12 11 13 15   CREATININE 1.50* 1.62* 1.85* 1.74*  CALCIUM 9.2 9.2 9.3 9.2  MG  --  2.2 2.0  --   PHOS  --   --  3.5  --    GFR: Estimated Creatinine Clearance: 60.7 mL/min (A) (by C-G formula based on SCr of 1.74 mg/dL (H)). Liver Function Tests: No results for input(s): AST, ALT, ALKPHOS, BILITOT, PROT, ALBUMIN in the last 168 hours. No results for input(s): LIPASE, AMYLASE in the last 168 hours. No results for input(s): AMMONIA in the last 168 hours. Coagulation Profile: No results for input(s): INR, PROTIME in the last 168 hours. Cardiac Enzymes: No results for input(s): CKTOTAL, CKMB, CKMBINDEX, TROPONINI in the last 168 hours. BNP (last 3 results) No results for input(s): PROBNP in the last 8760 hours. HbA1C: No results for input(s): HGBA1C in the last 72 hours. CBG: Recent Labs  Lab 02/04/21 0802  GLUCAP 95   Lipid Profile: No results for input(s): CHOL, HDL, LDLCALC, TRIG, CHOLHDL, LDLDIRECT in the last 72 hours. Thyroid Function Tests: Recent Labs    02/02/21 2335  TSH 2.333   Anemia Panel: No results for input(s): VITAMINB12, FOLATE,  FERRITIN, TIBC, IRON, RETICCTPCT in the last 72 hours. Sepsis Labs: No results for input(s): PROCALCITON, LATICACIDVEN in the last 168 hours.  Recent Results (from the past 240 hour(s))  Resp Panel by RT-PCR (Flu A&B, Covid) Nasopharyngeal Swab     Status: None   Collection Time: 02/02/21  6:12 PM   Specimen: Nasopharyngeal Swab; Nasopharyngeal(NP) swabs in vial transport medium  Result Value Ref Range Status   SARS Coronavirus 2 by RT PCR NEGATIVE NEGATIVE Final    Comment: (NOTE) SARS-CoV-2 target nucleic acids are NOT DETECTED.  The SARS-CoV-2 RNA is generally detectable in upper respiratory specimens during the acute phase of infection. The lowest concentration of SARS-CoV-2 viral copies this  assay can detect is 138 copies/mL. A negative result does not preclude SARS-Cov-2 infection and should not be used as the sole basis for treatment or other patient management decisions. A negative result may occur with  improper specimen collection/handling, submission of specimen other than nasopharyngeal swab, presence of viral mutation(s) within the areas targeted by this assay, and inadequate number of viral copies(<138 copies/mL). A negative result must be combined with clinical observations, patient history, and epidemiological information. The expected result is Negative.  Fact Sheet for Patients:  BloggerCourse.com  Fact Sheet for Healthcare Providers:  SeriousBroker.it  This test is no t yet approved or cleared by the [place] FDA and  has been authorized for detection and/or diagnosis of SARS-CoV-2 by FDA under an Emergency Use Authorization (EUA). This EUA will remain  in effect (meaning this test can be used) for the duration of the COVID-19 declaration under Section 564(b)(1) of the Act, 21 U.S.C.section 360bbb-3(b)(1), unless the authorization is terminated  or revoked sooner.       Influenza A by PCR NEGATIVE NEGATIVE Final   Influenza B by PCR NEGATIVE NEGATIVE Final    Comment: (NOTE) The Xpert Xpress SARS-CoV-2/FLU/RSV plus assay is intended as an aid in the diagnosis of influenza from Nasopharyngeal swab specimens and should not be used as a sole basis for treatment. Nasal washings and aspirates are unacceptable for Xpert Xpress SARS-CoV-2/FLU/RSV testing.  Fact Sheet for Patients: BloggerCourse.com  Fact Sheet for Healthcare Providers: SeriousBroker.it  This test is not yet approved or cleared by the [place] FDA and has been authorized for detection and/or diagnosis of SARS-CoV-2 by FDA under an Emergency Use Authorization (EUA). This EUA will  remain in effect (meaning this test can be used) for the duration of the COVID-19 declaration under Section 564(b)(1) of the Act, 21 U.S.C. section 360bbb-3(b)(1), unless the authorization is terminated or revoked.  Performed at [facility], [address] [place], [place] [address]          Radiology Studies: No results found.      Scheduled Meds: . dapagliflozin propanediol  10 mg Oral Daily  . furosemide  40 mg Intravenous BID  . heparin  5,000 Units Subcutaneous Q8H  . metoprolol succinate  50 mg Oral Daily  . potassium chloride  40 mEq Oral BID  . sacubitril-valsartan  1 tablet Oral BID  . sodium chloride flush  3 mL Intravenous Q12H   Continuous Infusions: . sodium chloride    . DOPamine 3 mcg/kg/min (02/05/21 0453)    Assessment & Plan:   Principal Problem:   Acute exacerbation of CHF (congestive heart failure) (HCC) Active Problems:   Essential hypertension   Hyperlipidemia   Truncal obesity   Osteoarthritis of both knees  # Acute biventricular failure Still volume overloaded Echo  01/07/21 rv systolic function severely depressed and EF 25-30% 3/31-bnp elevated good UO.-3000 in 24hrs. Cards following On dopamin gtt with iv lasix , will continue as he continues to have good UO. Daily wt I/os   # Hypervolemic hyponatremia-secondary to fluid overload Improving with good UO/ionotropes/diuresis Na 124>>> 128 today.  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  Likely component of cardiorenal from biventricular failure Improving with dopamine and diuresis Creatine down 1/74 Continue to monitor   Hypokalemia -K3.2  Will replace with KCl  Monitor levels      DVT prophylaxis: heparin Code Status:full Family Communication: none at bedside  Status is: Inpatient  Remains inpatient appropriate because:IV treatments appropriate due to intensity of illness or inability to take PO   Dispo:  Patient  From: Home  Planned Disposition: Home  Medically stable for discharge: No              LOS: 2 days   Time spent: 35 min with >50% on coc    Lynn Ito, MD [facility] Pager [phone]  If 7PM-7AM, please contact night-coverage 02/05/2021, 8:17 AM

## 2021-02-05 NOTE — Progress Notes (Signed)
Mobility Specialist - Progress Note   02/05/21 1643  Mobility  Activity Ambulated in hall  Level of Assistance Standby assist, set-up cues, supervision of patient - no hands on  Assistive Device Front wheel walker  Distance Ambulated (ft) 160 ft  Mobility Response Tolerated well  Mobility performed by Mobility specialist  $Mobility charge 1 Mobility    Pre-mobility: 89 HR, 99% SpO2 During mobility: 97 HR, 92% SpO2 Post-mobility: 93 HR, 94% SpO2   Second attempt this date. Pt ambulated in hallway with RW. First 34' without knee brace, then wife arrived with knee braces and pt ambulated an additional 65' with brace. No LOB noted, however pt did state both bouts that he's nervous d/t it feeling like he's going forward as if he'd fall. No reports of pain. O2 > 95% on first bout and > 91% on second bout. RA. SBA.   Filiberto Pinks Mobility Specialist 02/05/21, 4:47 PM

## 2021-02-06 DIAGNOSIS — I5082 Biventricular heart failure: Secondary | ICD-10-CM

## 2021-02-06 LAB — BASIC METABOLIC PANEL
Anion gap: 11 (ref 5–15)
BUN: 13 mg/dL (ref 8–23)
CO2: 24 mmol/L (ref 22–32)
Calcium: 9.4 mg/dL (ref 8.9–10.3)
Chloride: 93 mmol/L — ABNORMAL LOW (ref 98–111)
Creatinine, Ser: 1.52 mg/dL — ABNORMAL HIGH (ref 0.61–1.24)
GFR, Estimated: 50 mL/min — ABNORMAL LOW (ref >60)
Glucose, Bld: 101 mg/dL — ABNORMAL HIGH (ref 70–99)
Potassium: 3.5 mmol/L (ref 3.5–5.1)
Sodium: 128 mmol/L — ABNORMAL LOW (ref 135–145)

## 2021-02-06 NOTE — Progress Notes (Addendum)
Mobility Specialist - Progress Note   02/06/21 1200  Mobility  Activity Ambulated in hall  Level of Assistance Standby assist, set-up cues, supervision of patient - no hands on  Assistive Device Front wheel walker  Distance Ambulated (ft) 80 ft  Mobility Response Tolerated well  Mobility performed by Mobility specialist  $Mobility charge 1 Mobility    Pre-mobility: 85 HR, 91% SpO2 During mobility: 96 HR, 90% SpO2 Post-mobility: 96 HR, 95% SpO2   Pt ambulated in hallway with RW. No LOB. Denies SOB. Participated in seated therex upon return to room: ankle pumps, straight leg raises, abduction, and isometrics. Encouraged to continue exercises in between sessions to retain/regain strength. Pt reports weakness in BLE. States he uses crutches at baseline, but would like to have a RW once returned home. Reports fear of falling d/t knees locking at times. Has stairs to enter the home. Will benefit from stair-training with PT during stay. RN notified. ModI. RA.    Filiberto Pinks Mobility Specialist 02/06/21, 12:31 PM

## 2021-02-06 NOTE — Plan of Care (Signed)

## 2021-02-06 NOTE — Progress Notes (Signed)
PROGRESS NOTE    Tyler Ochoa  ZOX:[id number] DOB: 1951-09-05 DOA: 02/02/2021 PCP: Queen Slough, MD    Brief Narrative:  Tyler Ochoa is a 70 y.o. male with medical history significant for hypertension, heart failure reduced ejection fraction, is a new diagnosis, presents to the emergency department for chief concerns of shortness of breath that is worse with exertion.Found with CHF.  Covid, influenza A, influenza B negative  3/30-feels better today. Still volume overloaded Decrease UO since last night per pt.  3/31-scrotal edema improving some. Good UO . 4/1- scrotal edema improving. Good UO. Wt down  Consultants:   cardiology  Procedures:   Antimicrobials:       Subjective: Feels scrotal edema improving. No sob. LE edema improving but still significant   Objective: Vitals:   02/06/21 0747 02/06/21 0918 02/06/21 1119 02/06/21 1136  BP: (!) 89/54 107/71 (!) 159/134 102/75  Pulse: 83 91 (!) 113 84  Resp: 17     Temp: 97.9 F (36.6 C)  97.6 F (36.4 C) 97.8 F (36.6 C)  TempSrc: Oral  Oral Oral  SpO2: 96%  94% 97%  Weight:      Height:        Intake/Output Summary (Last 24 hours) at 02/06/2021 1455 Last data filed at 02/06/2021 1400 Gross per 24 hour  Intake 1050.68 ml  Output 3325 ml  Net -2274.32 ml   Filed Weights   02/04/21 0600 02/05/21 0500 02/06/21 0400  Weight: (!) 138.4 kg (!) 136.9 kg 134.8 kg    Examination: Girlfriend at bedside, calm, comfortable Decreased breath sounds at bases no wheezing or rails JVD positive, regular S1-S2 no gallops Soft benign positive bowel sounds +scrotal edema, improving 3+ le pitting edema improving Aaxox3, grossly intact   Data Reviewed: I have personally reviewed following labs and imaging studies  CBC: Recent Labs  Lab 02/02/21 1359 02/03/21 0438 02/04/21 0558  WBC 3.3* 3.0* 2.8*  NEUTROABS  --  1.9  --   HGB 14.3 14.4 13.7  HCT 42.2 41.6 40.6  MCV 84.2 83.2 84.1  PLT 241 223 220   Basic  Metabolic Panel: Recent Labs  Lab 02/02/21 1359 02/03/21 0714 02/04/21 0558 02/05/21 0415 02/06/21 0639  NA 125* 124* 125* 128* 128*  K 3.0* 4.4 4.1 3.2* 3.5  CL 89* 89* 91* 91* 93*  CO2 22 22 23 25 24   GLUCOSE 127* 99 90 105* 101*  BUN 12 11 13 15 13   CREATININE 1.50* 1.62* 1.85* 1.74* 1.52*  CALCIUM 9.2 9.2 9.3 9.2 9.4  MG  --  2.2 2.0  --   --   PHOS  --   --  3.5  --   --    GFR: Estimated Creatinine Clearance: 68.8 mL/min (A) (by C-G formula based on SCr of 1.52 mg/dL (H)). Liver Function Tests: No results for input(s): AST, ALT, ALKPHOS, BILITOT, PROT, ALBUMIN in the last 168 hours. No results for input(s): LIPASE, AMYLASE in the last 168 hours. No results for input(s): AMMONIA in the last 168 hours. Coagulation Profile: No results for input(s): INR, PROTIME in the last 168 hours. Cardiac Enzymes: No results for input(s): CKTOTAL, CKMB, CKMBINDEX, TROPONINI in the last 168 hours. BNP (last 3 results) No results for input(s): PROBNP in the last 8760 hours. HbA1C: No results for input(s): HGBA1C in the last 72 hours. CBG: Recent Labs  Lab 02/04/21 0802  GLUCAP 95   Lipid Profile: No results for input(s): CHOL, HDL, LDLCALC, TRIG, CHOLHDL, LDLDIRECT in  the last 72 hours. Thyroid Function Tests: No results for input(s): TSH, T4TOTAL, FREET4, T3FREE, THYROIDAB in the last 72 hours. Anemia Panel: No results for input(s): VITAMINB12, FOLATE, FERRITIN, TIBC, IRON, RETICCTPCT in the last 72 hours. Sepsis Labs: No results for input(s): PROCALCITON, LATICACIDVEN in the last 168 hours.  Recent Results (from the past 240 hour(s))  Resp Panel by RT-PCR (Flu A&B, Covid) Nasopharyngeal Swab     Status: None   Collection Time: 02/02/21  6:12 PM   Specimen: Nasopharyngeal Swab; Nasopharyngeal(NP) swabs in vial transport medium  Result Value Ref Range Status   SARS Coronavirus 2 by RT PCR NEGATIVE NEGATIVE Final    Comment: (NOTE) SARS-CoV-2 target nucleic acids are NOT  DETECTED.  The SARS-CoV-2 RNA is generally detectable in upper respiratory specimens during the acute phase of infection. The lowest concentration of SARS-CoV-2 viral copies this assay can detect is 138 copies/mL. A negative result does not preclude SARS-Cov-2 infection and should not be used as the sole basis for treatment or other patient management decisions. A negative result may occur with  improper specimen collection/handling, submission of specimen other than nasopharyngeal swab, presence of viral mutation(s) within the areas targeted by this assay, and inadequate number of viral copies(<138 copies/mL). A negative result must be combined with clinical observations, patient history, and epidemiological information. The expected result is Negative.  Fact Sheet for Patients:  BloggerCourse.com  Fact Sheet for Healthcare Providers:  SeriousBroker.it  This test is no t yet approved or cleared by the [place] FDA and  has been authorized for detection and/or diagnosis of SARS-CoV-2 by FDA under an Emergency Use Authorization (EUA). This EUA will remain  in effect (meaning this test can be used) for the duration of the COVID-19 declaration under Section 564(b)(1) of the Act, 21 U.S.C.section 360bbb-3(b)(1), unless the authorization is terminated  or revoked sooner.       Influenza A by PCR NEGATIVE NEGATIVE Final   Influenza B by PCR NEGATIVE NEGATIVE Final    Comment: (NOTE) The Xpert Xpress SARS-CoV-2/FLU/RSV plus assay is intended as an aid in the diagnosis of influenza from Nasopharyngeal swab specimens and should not be used as a sole basis for treatment. Nasal washings and aspirates are unacceptable for Xpert Xpress SARS-CoV-2/FLU/RSV testing.  Fact Sheet for Patients: BloggerCourse.com  Fact Sheet for Healthcare Providers: SeriousBroker.it  This test is not yet  approved or cleared by the [place] FDA and has been authorized for detection and/or diagnosis of SARS-CoV-2 by FDA under an Emergency Use Authorization (EUA). This EUA will remain in effect (meaning this test can be used) for the duration of the COVID-19 declaration under Section 564(b)(1) of the Act, 21 U.S.C. section 360bbb-3(b)(1), unless the authorization is terminated or revoked.  Performed at [facility], [address], [place], [place] [address]          Radiology Studies: No results found.      Scheduled Meds: . dapagliflozin propanediol  10 mg Oral Daily  . furosemide  40 mg Intravenous BID  . heparin  5,000 Units Subcutaneous Q8H  . metoprolol succinate  50 mg Oral Daily  . potassium chloride  40 mEq Oral BID  . sacubitril-valsartan  1 tablet Oral BID  . sodium chloride flush  3 mL Intravenous Q12H   Continuous Infusions: . sodium chloride    . DOPamine 3 mcg/kg/min (02/06/21 0505)    Assessment & Plan:   Principal Problem:   Acute exacerbation of CHF (congestive heart failure) (  HCC) Active Problems:   Essential hypertension   Hyperlipidemia   Truncal obesity   Osteoarthritis of both knees  # Acute biventricular failure Still volume overloaded Echo 01/07/21 rv systolic function severely depressed and EF 25-30% 3/31-bnp elevated 4/1-good urine output over 2000 mL.  Weight down, 4 kg since yesterday.   Clinically improving but still volume overloaded  Continue dopamine drip with IV Lasix  Daily weight  I's and O's  Cardiology following  We will need to follow-up heart failure clinic as outpatient    # Hypervolemic hyponatremia-secondary to fluid overload Improving with diuresis Na 128 today. 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  Likely component of cardiorenal from color failure  Improving with dopamine and diuresis  Creatinine down to 1.52  Continue to  monitor   Hypokalemia - Improved  Continue to monitor and supplement Likely will need to add Aldactone once creatinine stabilized     DVT prophylaxis: heparin Code Status:full Family Communication: none at bedside  Status is: Inpatient  Remains inpatient appropriate because:IV treatments appropriate due to intensity of illness or inability to take PO   Dispo:  Patient From: Home  Planned Disposition: Home  Medically stable for discharge: No              LOS: 3 days   Time spent: 35 min with >50% on coc    Tyler Ito, MD [facility] Pager [phone]  If 7PM-7AM, please contact night-coverage 02/06/2021, 2:55 PM

## 2021-02-06 NOTE — Progress Notes (Signed)
Pt refusing bed alarm currently. Pt educated to fall precaution measures and the importance of bed alarm use for pt safety. Pt adamant about refusal, states he will not get OOB but would like to be able to sit at the EOB to void in the urinal. Pt educated to call for help if pt attempts to ambulate to the BR. Bed in low position, non-slip socks on pt, call bell within reach. No further actions at this time, will continue to monitor.

## 2021-02-06 NOTE — Progress Notes (Addendum)
   Heart Failure Nurse Navigator Note  HFrEF 25 to 30%.  Severe concentric LVH.  Severe biatrial enlargement.  Right ventricular systolic function is severely decreased.  He presented to the emergency room with complaints of worsening shortness of breath, lower extremity edema extending up his legs to his scrotal area.  Comorbidities:  Hypertension Hyperlipidemia Osteoarthritis  Medications:  Farxiga 10 mg daily Dopamine infusion at 3 mics per kilogram per minute Lasix 40 mg IV every 12 Metoprolol succinate 50 mg daily Potassium chloride 40 mEq 2 times a day Entresto 24/26 mg  2 times daily  Labs:  Sodium 128, potassium 3.5, chloride 93, CO2 24, BUN 13, creatinine 1.52 Intake 1150 Output 3425 mL Weight is 134.8 kg down from 136.9. Blood pressure 102/75  Assessment:  General-he is awake and alert lying in bed in no acute distress.  He denies any increasing shortness of breath.  HEENT-pupils are equal, normocephalic,  Cardiac-heart tones of regular rate and rhythm, positive systolic murmur.  Chest-breath sounds are clear to posterior auscultation.  Abdomen-rounded soft non tender  Musculoskeletal-bilateral legs remain edematous 1+ pitting.  He states that his scrotum is less swollen today.  Psych-is pleasant and appropriate makes eye contact.  Neurologic-speech clear, moves all extremities without difficulty.   Met with patient again today, girlfriend is present today.  Discussed diet, went over foods that are good for him to eat and ones that should be avoided.  Girlfriend is very upset that they were not instructed when he was first told he had a decreased function of his heart to eat low-sodium foods so she was feeding him hot dogs and foods that are on the do not eat list.  Felt by doing that that she which is compounding the problems.  Discussed when eating in restaurant to ask the servers for the sodium content of the foods they are wanting to order, also to  abstain from the restaurant seasoning their meat nor to eat gravies or sauces.  They both voiced understanding.  At home that they can use Mrs. Dash for seasoning or any other seasoning that does not contain the salt in the name.  They understand the fluid limit.  They were given the doctor gourmet low-sodium diet book.  Had no further questions at this time.  He was given scale to start to do daily weights, discussed urinating first,not eating breakfast til weight completed.  Pricilla Riffle RN [facility]

## 2021-02-06 NOTE — Progress Notes (Signed)
SUBJECTIVE: Patient no longer short of breath   Vitals:   02/05/21 2021 02/05/21 2314 02/06/21 0400 02/06/21 0747  BP: 103/64 (!) 89/64 102/67 (!) 89/54  Pulse: 85 82 84 83  Resp: 20 18 16    Temp: 98.5 F (36.9 C) 98.6 F (37 C) 98.9 F (37.2 C) 97.9 F (36.6 C)  TempSrc: Oral Oral Oral Oral  SpO2: 92% 95% 99% 96%  Weight:   134.8 kg   Height:        Intake/Output Summary (Last 24 hours) at 02/06/2021 0812 Last data filed at 02/06/2021 0505 Gross per 24 hour  Intake 1050.68 ml  Output 3225 ml  Net -2174.32 ml    LABS: Basic Metabolic Panel: Recent Labs    02/04/21 0558 02/05/21 0415 02/06/21 0639  NA 125* 128* 128*  K 4.1 3.2* 3.5  CL 91* 91* 93*  CO2 23 25 24   GLUCOSE 90 105* 101*  BUN 13 15 13   CREATININE 1.85* 1.74* 1.52*  CALCIUM 9.3 9.2 9.4  MG 2.0  --   --   PHOS 3.5  --   --    Liver Function Tests: No results for input(s): AST, ALT, ALKPHOS, BILITOT, PROT, ALBUMIN in the last 72 hours. No results for input(s): LIPASE, AMYLASE in the last 72 hours. CBC: Recent Labs    02/04/21 0558  WBC 2.8*  HGB 13.7  HCT 40.6  MCV 84.1  PLT 220   Cardiac Enzymes: No results for input(s): CKTOTAL, CKMB, CKMBINDEX, TROPONINI in the last 72 hours. BNP: Invalid input(s): POCBNP D-Dimer: No results for input(s): DDIMER in the last 72 hours. Hemoglobin A1C: No results for input(s): HGBA1C in the last 72 hours. Fasting Lipid Panel: No results for input(s): CHOL, HDL, LDLCALC, TRIG, CHOLHDL, LDLDIRECT in the last 72 hours. Thyroid Function Tests: No results for input(s): TSH, T4TOTAL, T3FREE, THYROIDAB in the last 72 hours.  Invalid input(s): FREET3 Anemia Panel: No results for input(s): VITAMINB12, FOLATE, FERRITIN, TIBC, IRON, RETICCTPCT in the last 72 hours.   PHYSICAL EXAM General: Well developed, well nourished, in no acute distress HEENT:  Normocephalic and atramatic Neck:  No JVD.  Lungs: Clear bilaterally to auscultation and percussion. Heart: HRRR  . Normal S1 and S2 without gallops or murmurs.  Abdomen: Bowel sounds are positive, abdomen soft and non-tender  Msk:  Back normal, normal gait. Normal strength and tone for age. Extremities: No clubbing, cyanosis or edema.   Neuro: Alert and oriented X 3. Psych:  Good affect, responds appropriately  TELEMETRY: Sinus rhythm with first-degree AV block  ASSESSMENT AND PLAN: HFrEF with hepatorenal syndrome and renal insufficiency which is significantly improving with low-dose dopamine at 3 mics and IV Lasix.  Continue dopamine and Lasix for another day or so and then wean off continue Lasix p.o. upon discharge.  Principal Problem:   Acute exacerbation of CHF (congestive heart failure) (HCC) Active Problems:   Essential hypertension   Hyperlipidemia   Truncal obesity   Osteoarthritis of both knees    Athea Haley A, MD, [facility] 02/06/2021 8:12 AM

## 2021-02-06 NOTE — Care Management Important Message (Signed)
Important Message  Patient Details  Name: Tyler Ochoa MRN: [id number] Date of Birth: Mar 11, 1951   Medicare Important Message Given:  Yes     [name] 02/06/2021, 1:55 PM

## 2021-02-07 DIAGNOSIS — N179 Acute kidney failure, unspecified: Secondary | ICD-10-CM

## 2021-02-07 DIAGNOSIS — E871 Hypo-osmolality and hyponatremia: Secondary | ICD-10-CM

## 2021-02-07 LAB — BASIC METABOLIC PANEL
Anion gap: 11 (ref 5–15)
BUN: 11 mg/dL (ref 8–23)
CO2: 25 mmol/L (ref 22–32)
Calcium: 9 mg/dL (ref 8.9–10.3)
Chloride: 94 mmol/L — ABNORMAL LOW (ref 98–111)
Creatinine, Ser: 1.34 mg/dL — ABNORMAL HIGH (ref 0.61–1.24)
GFR, Estimated: 58 mL/min — ABNORMAL LOW (ref >60)
Glucose, Bld: 107 mg/dL — ABNORMAL HIGH (ref 70–99)
Potassium: 3.6 mmol/L (ref 3.5–5.1)
Sodium: 130 mmol/L — ABNORMAL LOW (ref 135–145)

## 2021-02-07 NOTE — Progress Notes (Signed)
SUBJECTIVE: Patient no longer short of breath   Vitals:   02/07/21 0417 02/07/21 0500 02/07/21 0733 02/07/21 1137  BP: 107/70  112/72 (!) 89/67  Pulse: 79  88 84  Resp: 18     Temp: 98.2 F (36.8 C)  98.5 F (36.9 C) (!) 97.3 F (36.3 C)  TempSrc:   Oral Oral  SpO2: 95%  96% 97%  Weight:  134.8 kg    Height:        Intake/Output Summary (Last 24 hours) at 02/07/2021 1203 Last data filed at 02/07/2021 0900 Gross per 24 hour  Intake 1323 ml  Output 3100 ml  Net -1777 ml    LABS: Basic Metabolic Panel: Recent Labs    02/06/21 0639 02/07/21 0354  NA 128* 130*  K 3.5 3.6  CL 93* 94*  CO2 24 25  GLUCOSE 101* 107*  BUN 13 11  CREATININE 1.52* 1.34*  CALCIUM 9.4 9.0   Liver Function Tests: No results for input(s): AST, ALT, ALKPHOS, BILITOT, PROT, ALBUMIN in the last 72 hours. No results for input(s): LIPASE, AMYLASE in the last 72 hours. CBC: No results for input(s): WBC, NEUTROABS, HGB, HCT, MCV, PLT in the last 72 hours. Cardiac Enzymes: No results for input(s): CKTOTAL, CKMB, CKMBINDEX, TROPONINI in the last 72 hours. BNP: Invalid input(s): POCBNP D-Dimer: No results for input(s): DDIMER in the last 72 hours. Hemoglobin A1C: No results for input(s): HGBA1C in the last 72 hours. Fasting Lipid Panel: No results for input(s): CHOL, HDL, LDLCALC, TRIG, CHOLHDL, LDLDIRECT in the last 72 hours. Thyroid Function Tests: No results for input(s): TSH, T4TOTAL, T3FREE, THYROIDAB in the last 72 hours.  Invalid input(s): FREET3 Anemia Panel: No results for input(s): VITAMINB12, FOLATE, FERRITIN, TIBC, IRON, RETICCTPCT in the last 72 hours.   PHYSICAL EXAM General: Well developed, well nourished, in no acute distress HEENT:  Normocephalic and atramatic Neck:  No JVD.  Lungs: Clear bilaterally to auscultation and percussion. Heart: HRRR . Normal S1 and S2 without gallops or murmurs.  Abdomen: Bowel sounds are positive, abdomen soft and non-tender  Msk:  Back normal,  normal gait. Normal strength and tone for age. Extremities: No clubbing, cyanosis or edema.   Neuro: Alert and oriented X 3. Psych:  Good affect, responds appropriately  TELEMETRY: Sinus rhythm  ASSESSMENT AND PLAN: Congestive heart failure with hepatorenal syndrome.  Creatinine today 1.34 which is significantly improved.  Advise discontinuing dopamine drip and switch Lasix to 40 p.o. twice daily and can be discharged with follow-up Tuesday at 9 AM.  Principal Problem:   Acute exacerbation of CHF (congestive heart failure) (HCC) Active Problems:   Essential hypertension   Hyperlipidemia   Truncal obesity   Osteoarthritis of both knees    Veronia Laprise A, MD, [facility] 02/07/2021 12:03 PM

## 2021-02-07 NOTE — Progress Notes (Signed)
PROGRESS NOTE    Tyler Ochoa  ZOX:[id number] DOB: [birth year] DOA: 02/02/2021 PCP: Queen Slough, MD  Assessment & Plan:   Principal Problem:   Acute exacerbation of CHF (congestive heart failure) (HCC) Active Problems:   Essential hypertension   Hyperlipidemia   Truncal obesity   Osteoarthritis of both knees  Acute on chronic systolic CHF exacerbation: w/ likely cardiorenal syndrome. Echo shows EF 25-30%, LV global hypokinesis and diastolic parameters are indeterminate. Continue on IV lasix. Monitor I/Os and daily weights.   AKI on CKDIIIa: possible component of cardiorenal. Continue on IV dopamine & IV lasix. Cr is trending down from day prior  Hypervolemic hyponatremia: trending up today. Will continue to monitor   Elevated troponins: likely secondary to demand ischemia    Hypokalemia: WNL today  DM2: likely poorly controlled. Continue on farxiga.   Morbid obesity: BMI 38.1. Complicates overall care and prognosis   DVT prophylaxis: heparin  Code Status: full Family Communication:  Disposition Plan: likely d/c back home   Level of care: Progressive Cardiac   Status is: Inpatient  Remains inpatient appropriate because:Ongoing diagnostic testing needed not appropriate for outpatient work up, IV treatments appropriate due to intensity of illness or inability to take PO and Inpatient level of care appropriate due to severity of illness   Dispo:  Patient From: Home  Planned Disposition: Home  Medically stable for discharge: No          Consultants:   Cardio   Procedures:   Antimicrobials:   Subjective: Pt c/o malaise and urinating often   Objective: Vitals:   02/07/21 0417 02/07/21 0500 02/07/21 0733 02/07/21 1137  BP: 107/70  112/72 (!) 89/67  Pulse: 79  88 84  Resp: 18   19  Temp: 98.2 F (36.8 C)  98.5 F (36.9 C) (!) 97.3 F (36.3 C)  TempSrc:   Oral Oral  SpO2: 95%  96% 97%  Weight:  134.8 kg    Height:        Intake/Output Summary  (Last 24 hours) at 02/07/2021 1319 Last data filed at 02/07/2021 0900 Gross per 24 hour  Intake 1323 ml  Output 3100 ml  Net -1777 ml   Filed Weights   02/05/21 0500 02/06/21 0400 02/07/21 0500  Weight: (!) 136.9 kg 134.8 kg 134.8 kg    Examination:  General exam: Appears calm and comfortable  Respiratory system: Clear to auscultation. Respiratory effort normal. Cardiovascular system: S1 & S2 +. No rubs, gallops or clicks. B/l LE edema  Gastrointestinal system: Abdomen is nondistended, soft and nontender. Normal bowel sounds heard. Central nervous system: Alert and oriented. Moves all 4 extremities  Psychiatry: Judgement and insight appear normal. Mood & affect appropriate.     Data Reviewed: I have personally reviewed following labs and imaging studies  CBC: Recent Labs  Lab 02/02/21 1359 02/03/21 0438 02/04/21 0558  WBC 3.3* 3.0* 2.8*  NEUTROABS  --  1.9  --   HGB 14.3 14.4 13.7  HCT 42.2 41.6 40.6  MCV 84.2 83.2 84.1  PLT 241 223 220   Basic Metabolic Panel: Recent Labs  Lab 02/03/21 0714 02/04/21 0558 02/05/21 0415 02/06/21 0639 02/07/21 0354  NA 124* 125* 128* 128* 130*  K 4.4 4.1 3.2* 3.5 3.6  CL 89* 91* 91* 93* 94*  CO2 22 23 25 24 25   GLUCOSE 99 90 105* 101* 107*  BUN 11 13 15 13 11   CREATININE 1.62* 1.85* 1.74* 1.52* 1.34*  CALCIUM 9.2 9.3 9.2 9.4 9.0  MG 2.2 2.0  --   --   --   PHOS  --  3.5  --   --   --    GFR: Estimated Creatinine Clearance: 78.1 mL/min (A) (by C-G formula based on SCr of 1.34 mg/dL (H)). Liver Function Tests: No results for input(s): AST, ALT, ALKPHOS, BILITOT, PROT, ALBUMIN in the last 168 hours. No results for input(s): LIPASE, AMYLASE in the last 168 hours. No results for input(s): AMMONIA in the last 168 hours. Coagulation Profile: No results for input(s): INR, PROTIME in the last 168 hours. Cardiac Enzymes: No results for input(s): CKTOTAL, CKMB, CKMBINDEX, TROPONINI in the last 168 hours. BNP (last 3 results) No  results for input(s): PROBNP in the last 8760 hours. HbA1C: No results for input(s): HGBA1C in the last 72 hours. CBG: Recent Labs  Lab 02/04/21 0802  GLUCAP 95   Lipid Profile: No results for input(s): CHOL, HDL, LDLCALC, TRIG, CHOLHDL, LDLDIRECT in the last 72 hours. Thyroid Function Tests: No results for input(s): TSH, T4TOTAL, FREET4, T3FREE, THYROIDAB in the last 72 hours. Anemia Panel: No results for input(s): VITAMINB12, FOLATE, FERRITIN, TIBC, IRON, RETICCTPCT in the last 72 hours. Sepsis Labs: No results for input(s): PROCALCITON, LATICACIDVEN in the last 168 hours.  Recent Results (from the past 240 hour(s))  Resp Panel by RT-PCR (Flu A&B, Covid) Nasopharyngeal Swab     Status: None   Collection Time: 02/02/21  6:12 PM   Specimen: Nasopharyngeal Swab; Nasopharyngeal(NP) swabs in vial transport medium  Result Value Ref Range Status   SARS Coronavirus 2 by RT PCR NEGATIVE NEGATIVE Final    Comment: (NOTE) SARS-CoV-2 target nucleic acids are NOT DETECTED.  The SARS-CoV-2 RNA is generally detectable in upper respiratory specimens during the acute phase of infection. The lowest concentration of SARS-CoV-2 viral copies this assay can detect is 138 copies/mL. A negative result does not preclude SARS-Cov-2 infection and should not be used as the sole basis for treatment or other patient management decisions. A negative result may occur with  improper specimen collection/handling, submission of specimen other than nasopharyngeal swab, presence of viral mutation(s) within the areas targeted by this assay, and inadequate number of viral copies(<138 copies/mL). A negative result must be combined with clinical observations, patient history, and epidemiological information. The expected result is Negative.  Fact Sheet for Patients:  BloggerCourse.com  Fact Sheet for Healthcare Providers:  SeriousBroker.it  This test is no t  yet approved or cleared by the [place] FDA and  has been authorized for detection and/or diagnosis of SARS-CoV-2 by FDA under an Emergency Use Authorization (EUA). This EUA will remain  in effect (meaning this test can be used) for the duration of the COVID-19 declaration under Section 564(b)(1) of the Act, 21 U.S.C.section 360bbb-3(b)(1), unless the authorization is terminated  or revoked sooner.       Influenza A by PCR NEGATIVE NEGATIVE Final   Influenza B by PCR NEGATIVE NEGATIVE Final    Comment: (NOTE) The Xpert Xpress SARS-CoV-2/FLU/RSV plus assay is intended as an aid in the diagnosis of influenza from Nasopharyngeal swab specimens and should not be used as a sole basis for treatment. Nasal washings and aspirates are unacceptable for Xpert Xpress SARS-CoV-2/FLU/RSV testing.  Fact Sheet for Patients: BloggerCourse.com  Fact Sheet for Healthcare Providers: SeriousBroker.it  This test is not yet approved or cleared by the [place] FDA and has been authorized for detection and/or diagnosis of SARS-CoV-2 by FDA under an Emergency Use Authorization (EUA). This EUA  will remain in effect (meaning this test can be used) for the duration of the COVID-19 declaration under Section 564(b)(1) of the Act, 21 U.S.C. section 360bbb-3(b)(1), unless the authorization is terminated or revoked.  Performed at [facility], [address] [place], [place] [address]          Radiology Studies: No results found.      Scheduled Meds: . dapagliflozin propanediol  10 mg Oral Daily  . furosemide  40 mg Intravenous BID  . heparin  5,000 Units Subcutaneous Q8H  . metoprolol succinate  50 mg Oral Daily  . potassium chloride  40 mEq Oral BID  . sacubitril-valsartan  1 tablet Oral BID  . sodium chloride flush  3 mL Intravenous Q12H   Continuous Infusions: . sodium chloride    . DOPamine 3 mcg/kg/min (02/07/21  1242)     LOS: 4 days    Time spent: 33 mins    Charise Killian, MD [facility] Pager [phone]  If 7PM-7AM, please contact night-coverage  02/07/2021, 1:19 PM

## 2021-02-07 NOTE — Progress Notes (Signed)
Mobility Specialist - Progress Note   02/07/21 1200  Mobility  Activity Refused mobility  Mobility performed by Mobility specialist    Pt politely declined mobility this date d/t wanting to rest. States he wasn't able to get much sleep last night. Pt sitting EOB eating lunch. Spouse at bedside.    Tyler Ochoa Mobility Specialist 02/07/21, 12:32 PM

## 2021-02-08 LAB — BASIC METABOLIC PANEL
Anion gap: 10 (ref 5–15)
BUN: 11 mg/dL (ref 8–23)
CO2: 24 mmol/L (ref 22–32)
Calcium: 9.3 mg/dL (ref 8.9–10.3)
Chloride: 99 mmol/L (ref 98–111)
Creatinine, Ser: 1.35 mg/dL — ABNORMAL HIGH (ref 0.61–1.24)
GFR, Estimated: 58 mL/min — ABNORMAL LOW (ref >60)
Glucose, Bld: 103 mg/dL — ABNORMAL HIGH (ref 70–99)
Potassium: 3.9 mmol/L (ref 3.5–5.1)
Sodium: 133 mmol/L — ABNORMAL LOW (ref 135–145)

## 2021-02-08 LAB — CBC
HCT: 41.5 % (ref 39.0–52.0)
Hemoglobin: 14.1 g/dL (ref 13.0–17.0)
MCH: 28.5 pg (ref 26.0–34.0)
MCHC: 34 g/dL (ref 30.0–36.0)
MCV: 83.8 fL (ref 80.0–100.0)
Platelets: 228 10*3/uL (ref 150–400)
RBC: 4.95 MIL/uL (ref 4.22–5.81)
RDW: 16 % — ABNORMAL HIGH (ref 11.5–15.5)
WBC: 3.5 10*3/uL — ABNORMAL LOW (ref 4.0–10.5)
nRBC: 0 % (ref 0.0–0.2)

## 2021-02-08 NOTE — Progress Notes (Signed)
Patient noted awake most of the night struggling to use the urinals. RN educated extensive on dopamine precautions and the need for bedrest. Patient eventually allowed RN top place a male pure wick which was successful in help with bedrest recommendations while on Dopamine. Patient stated he do not have a blood pressure monitor at home. RN endorse to oncoming RN to see if the heart failure team could help patient obtain a blood pressure machine. Patient confided in RN that all this new lifestyle changes for his HF will be hard to remember. CC gave a HF booklet and reeducated on fluid restriction and how to monitor the fluid intake. RN reminded patient that the doctor referred him  to the heart failure clinic. Patient stated she will need a caregiver at, RN endorsed to oncoming RN to have social worker or case management speak with patient about about home care services.

## 2021-02-08 NOTE — Evaluation (Signed)
Occupational Therapy Evaluation Patient Details Name: Tyler Ochoa MRN: [id number] DOB: 19-Dec-1950 Today's Date: 02/08/2021    History of Present Illness Pt is a 70 y/o M with PMH: HTN, HLD, OA b/l knees, and CHF who was adm with CHF exacerbation.   Clinical Impression   Pt seen for OT evaluation this date in setting of acute hospitalization d/t CHF exacerbation. Pt reports that he is INDEP for self care at baseline and intermittently MOD I for fxl mobility with crutches. Pt presents this date with some decreased fxl activity tolerance and underlying baseline knee pain from OA impacting his ability to safely perform ADLs and ADL mobility. Pt requires SUPV with use of bari RW (cues for correct sequence of use). Pt requires SETUP and extended time for all LB ADLs primarily d/t increased exertion required to bend over and decreased baseline knee ROM. Pt is noted to have weak trunk and OT engages pt in below-listed exercise giving pt and spouse education for form and technique. Pt and spouse with good understanding. Pt tolerates session well. Will continue to follow acutely to engage pt in HEP to improve truncal strength/stability to improve dynamic movements needed to complete ADLs. Do not currently anticipate that pt will require f/u OT outside of acute hospital stay.    Follow Up Recommendations  No OT follow up    Equipment Recommendations  Tub/shower seat;Other (comment) (bari 2ww)    Recommendations for Other Services       Precautions / Restrictions Precautions Precautions: Fall Restrictions Weight Bearing Restrictions: No      Mobility Bed Mobility Overal bed mobility: Modified Independent                  Transfers Overall transfer level: Needs assistance Equipment used: Rolling walker (2 wheeled) (bariatric) Transfers: Sit to/from [employer] Transfers Sit to Stand: Supervision;Modified independent (Device/Increase time) Stand pivot transfers:  Supervision;Modified independent (Device/Increase time)            Balance Overall balance assessment: Mild deficits observed, not formally tested                                         ADL either performed or assessed with clinical judgement   ADL                                         General ADL Comments: SETUP for bathing/dressing, able to perform fxl mobility with RW with CGA.     Vision Patient Visual Report: No change from baseline       Perception     Praxis      Pertinent Vitals/Pain Pain Assessment: Faces Faces Pain Scale: Hurts a little bit Pain Location: knees Pain Descriptors / Indicators: Aching;Sore Pain Intervention(s): Limited activity within patient's tolerance;Monitored during session     Hand Dominance Right   Extremity/Trunk Assessment Upper Extremity Assessment Upper Extremity Assessment: Overall WFL for tasks assessed   Lower Extremity Assessment Lower Extremity Assessment: Defer to PT evaluation;Generalized weakness (knee pain at baseline)       Communication Communication Communication: No difficulties   Cognition Arousal/Alertness: Awake/alert Behavior During Therapy: WFL for tasks assessed/performed Overall Cognitive Status: Within Functional Limits for tasks assessed  General Comments       Exercises Other Exercises Other Exercises: OT facilitates ed re: role of OT and importance of core strength for balance and fall prevention as well as safe use of RW. OT engages pt in modified bed level situp for 2 sets x10 reps and 1 set x10 reps   Shoulder Instructions      Home Living Family/patient expects to be discharged to:: Private residence Living Arrangements: Spouse/significant other Available Help at Discharge: Family;Available PRN/intermittently Type of Home: House Home Access: Stairs to enter [employer] of Steps: 11 from  basement entrance (their primary entrance) Entrance Stairs-Rails: Right;Left;Can reach both Home Layout: One level     Bathroom Shower/Tub: Chief Strategy Officer: Standard     Home Equipment: Crutches          Prior Functioning/Environment Level of Independence: Needs assistance  Gait / Transfers Assistance Needed: Occasional use of crutches (d/t knee pain) to ascend steps (uses on one side and railing on other side) and occasional use in community, INDEP with fxl mobility in the home. ADL's / Homemaking Assistance Needed: Pt performs all basic self care I'ly most of the time (reports sometimes when knee pain is intense, has spouse assistance). Spouse assists with IADLs including clean/cook.            OT Problem List: Decreased strength;Decreased activity tolerance;Impaired balance (sitting and/or standing);Pain      OT Treatment/Interventions: Self-care/ADL training;DME and/or AE instruction;Therapeutic activities;Balance training;Therapeutic exercise;Energy conservation;Patient/family education    OT Goals(Current goals can be found in the care plan section) Acute Rehab OT Goals Patient Stated Goal: to get stronger and get my heart better so I can eventually get knee surgery. OT Goal Formulation: With patient Time For Goal Achievement: 02/22/21 Potential to Achieve Goals: Good ADL Goals Pt Will Perform Lower Body Dressing: with modified independence;sit to/from stand (with LRAD/AE PRN) Pt/caregiver will Perform Home Exercise Program: Increased strength;Both right and left upper extremity;With Supervision (and back/core)  OT Frequency: Min 1X/week   Barriers to D/C: Inaccessible home environment          Co-evaluation              AM-PAC OT "6 Clicks" Daily Activity     Outcome Measure Help from another person eating meals?: None Help from another person taking care of personal grooming?: None Help from another person toileting, which includes  using toliet, bedpan, or urinal?: A Little Help from another person bathing (including washing, rinsing, drying)?: A Little Help from another person to put on and taking off regular upper body clothing?: None Help from another person to put on and taking off regular lower body clothing?: A Little 6 Click Score: 21   End of Session Equipment Utilized During Treatment: Gait belt;Rolling walker Nurse Communication: Mobility status  Activity Tolerance: Patient tolerated treatment well Patient left: Other (comment) (seated EOB with PT presenting for eval)  OT Visit Diagnosis: Unsteadiness on feet (R26.81);Muscle weakness (generalized) (M62.81);Pain Pain - part of body: Knee (b/l)                Time: 9767-3419 OT Time Calculation (min): 45 min Charges:  OT General Charges $OT Visit: 1 Visit OT Evaluation $OT Eval Moderate Complexity: 1 Mod OT Treatments $Self Care/Home Management : 8-22 mins $Therapeutic Activity: 8-22 mins $Therapeutic Exercise: 8-22 mins  Rejeana Brock, MS, OTR/L ascom [phone] 02/08/21, 1:01 PM

## 2021-02-08 NOTE — Evaluation (Addendum)
Physical Therapy Evaluation Patient Details Name: Tyler Ochoa MRN: [id number] DOB: [birth year] Today's Date: 02/08/2021   History of Present Illness  Pt is a 70 yo M admitted to [facility] on 02/02/21 for worsening leg edema and persistent scrotal edema. Pt was sick in Jan and believes to have had COVID; states he's had worsening edema and SOB since. PMH significant for: HTN, HLD, and new dx of CHF. Elevated troponin likely due to demand ischemia.    Clinical Impression  Pt is a 70 year old M admitted to hospital on 3/28 for acute exacerbation of CHF. At baseline, pt was mod I for ADL's, HH ambulation with crutches, and driving; spouse assists with IADL's. Pt presents with LE weakness, decreased activity tolerance, fair standing balance, increased bil knee pain, and +3 non-pitting edema in BLE, resulting in impaired functional mobility from baseline. Due to deficits, pt required supervision for transfers and supervision-CGA for safety with short distance ambulation with bariRW. Due to chronic knee pain, bil knee brace donned for mobility, as pt states he has intermittent knee buckling. Deficits limit the pt's ability to safely and independently perform ADL's, transfer, and ambulate. Pt will benefit from acute skilled PT services to address deficits for return to baseline function. At this time, PT recommends DC home with HHPT, intermittent supervision, and bariRW to address deficits and improve overall safety with functional mobility for return to baseline function. Pt and spouse agreeable.     Follow Up Recommendations Home health PT;Supervision - Intermittent    Equipment Recommendations  Rolling walker with 5" wheels (bariRW)    Recommendations for Other Services       Precautions / Restrictions Precautions Precautions: Fall Precaution Comments: has bil knee braces for transfers/gait, fluid restriction Restrictions Weight Bearing Restrictions: No      Mobility  Bed Mobility Overal  bed mobility: Modified Independent             General bed mobility comments: Pt seated at EOB upon PT entry    Transfers Overall transfer level: Needs assistance Equipment used: Rolling walker (2 wheeled) (bariRW) Transfers: Sit to/from Stand Sit to Stand: Supervision Stand pivot transfers: Supervision;Modified independent (Device/Increase time)       General transfer comment: Supervision for safety to perform STS transfer from EOB with bariRW and bil knee braces donned. Verbal cues for hand placement.  Ambulation/Gait Ambulation/Gait assistance: Supervision;Min guard Gait Distance (Feet): 20 Feet Assistive device: Rolling walker (2 wheeled) (bariRW)       General Gait Details: CGA-supervision for safety to ambulate short distance with bariRW. Pt demonstrates slowed cadence, decreased step length/foot clearance bil, and good safety awareness with turns.     Balance Overall balance assessment: Needs assistance Sitting-balance support: No upper extremity supported;Feet supported Sitting balance-Leahy Scale: Normal     Standing balance support: During functional activity;Bilateral upper extremity supported Standing balance-Leahy Scale: Fair Standing balance comment: CGA-supervision with standing balance in RW due to slowed cadence and c/o bil knee pain.                             Pertinent Vitals/Pain Pain Assessment: Faces Faces Pain Scale: Hurts a little bit Pain Location: knees Pain Descriptors / Indicators: Aching;Sore Pain Intervention(s): Limited activity within patient's tolerance;Monitored during session    Home Living Family/patient expects to be discharged to:: Private residence Living Arrangements: Spouse/significant other Available Help at Discharge: Family;Available PRN/intermittently Type of Home: House Home Access: Stairs to enter  Entrance Stairs-Rails: Right;Left;Can reach both Entrance Stairs-Number of Steps: 11 from basement  entrance (their primary entrance) Home Layout: One level Home Equipment: Crutches;Shower seat - built in      Prior Function Level of Independence: Needs assistance   Gait / Transfers Assistance Needed: Occasional use of crutches (d/t knee pain) to ascend steps (uses on one side and railing on other side) and occasional use in community, INDEP with fxl mobility in the home.  ADL's / Homemaking Assistance Needed: Pt performs all basic self care I'ly most of the time (reports sometimes when knee pain is intense, has spouse assistance). Spouse assists with IADLs including clean/cook.        Hand Dominance   Dominant Hand: Right    Extremity/Trunk Assessment   Upper Extremity Assessment Upper Extremity Assessment: Defer to OT evaluation    Lower Extremity Assessment Lower Extremity Assessment: Generalized weakness (bil hip flexion 3-/5, otherwise LE strength grossly 4/5. Decreased bil hip flexion AROM secondary to pt habitus, decreased bil ankle AROM secondary to edema)    Cervical / Trunk Assessment Cervical / Trunk Assessment: Normal  Communication   Communication: No difficulties  Cognition Arousal/Alertness: Awake/alert Behavior During Therapy: WFL for tasks assessed/performed Overall Cognitive Status: Within Functional Limits for tasks assessed                                 General Comments: A&O x4      General Comments General comments (skin integrity, edema, etc.): +3 non-pitting edema in BLE, not TTP. TED hose donned bil. Donned bil knee brace with M/L buttress for mobility    Exercises Other Exercises Other Exercises: Pt able to participate in transfers and short distance ambulation with bariRW and bil knee brace with M/L buttress. Pt states he's in need of bil TKA, but is unable to have sx due to heart issues. Pt required gross CGA-supervision for safety with upright mobility. Other Exercises: Pt and spouse educated regarding: PT role/POC, DC  recommendations, Ramp (1:12 ratio), DME use, and safety with mobility. They verbalized understanding.   Assessment/Plan    PT Assessment Patient needs continued PT services  PT Problem List Decreased strength;Decreased mobility;Decreased range of motion;Decreased activity tolerance;Decreased balance;Pain       PT Treatment Interventions Gait training;Stair training;Functional mobility training;Therapeutic activities;Therapeutic exercise;Balance training;Neuromuscular re-education    PT Goals (Current goals can be found in the Care Plan section)  Acute Rehab PT Goals Patient Stated Goal: to get stronger and get my heart better so I can eventually get knee surgery. PT Goal Formulation: With patient/family Time For Goal Achievement: 02/22/21 Potential to Achieve Goals: Good    Frequency Min 2X/week   Barriers to discharge Inaccessible home environment 11 STE home with bil railing       AM-PAC PT "6 Clicks" Mobility  Outcome Measure Help needed turning from your back to your side while in a flat bed without using bedrails?: None Help needed moving from lying on your back to sitting on the side of a flat bed without using bedrails?: None Help needed moving to and from a bed to a chair (including a wheelchair)?: A Little Help needed standing up from a chair using your arms (e.g., wheelchair or bedside chair)?: A Little Help needed to walk in hospital room?: A Little Help needed climbing 3-5 steps with a railing? : A Lot 6 Click Score: 19    End of Session Equipment Utilized During  Treatment: Gait belt (bil knee brace (from home)) Activity Tolerance: Patient tolerated treatment well Patient left: in bed;with family/visitor present (seated at EOB in preparation for sponge bath from spouse) Nurse Communication: Mobility status PT Visit Diagnosis: Unsteadiness on feet (R26.81);Muscle weakness (generalized) (M62.81);Difficulty in walking, not elsewhere classified (R26.2);Pain Pain -  Right/Left:  (bil) Pain - part of body: Knee    Time: 1975-8832 PT Time Calculation (min) (ACUTE ONLY): 21 min   Charges:   PT Evaluation $PT Eval Low Complexity: 1 Low         Vira Blanco, PT, DPT 1:18 PM,02/08/21

## 2021-02-08 NOTE — Progress Notes (Signed)
PROGRESS NOTE    Tyler Ochoa  [id number]:[id number] DOB: 1951/10/22 DOA: 02/02/2021 PCP: Queen Slough, MD  Assessment & Plan:   Principal Problem:   Acute exacerbation of CHF (congestive heart failure) (HCC) Active Problems:   Essential hypertension   Hyperlipidemia   Truncal obesity   Osteoarthritis of both knees  Acute on chronic systolic CHF exacerbation: w/ likely cardiorenal syndrome. Echo shows EF 25-30%, LV global hypokinesis and diastolic parameters are indeterminate. Continue on IV lasix. Monitor I/Os and daily weights. Received education on fluid and salt intake, pt verbalized his understanding   AKI on CKDIIIa: w/ possible component of cardiorenal. Dopamine was d/c as per cardio. Continue on lasix. Cr is stable from day prior.   Hypervolemic hyponatremia: continues to trend up. Likely secondary to CHF exacerbation   Elevated troponins: likely secondary to demand ischemia    Hypokalemia: within normal limits today   DM2: continue on farxiga. Likely poorly controlled.  Morbid obesity: BMI 38.1. Complicates overall care & prognosis     DVT prophylaxis: heparin  Code Status: full Family Communication:  Disposition Plan: likely d/c back home   Level of care: Progressive Cardiac   Status is: Inpatient  Remains inpatient appropriate because:Ongoing diagnostic testing needed not appropriate for outpatient work up, IV treatments appropriate due to intensity of illness or inability to take PO and Inpatient level of care appropriate due to severity of illness   Dispo:  Patient From: Home  Planned Disposition: Home  Medically stable for discharge: No          Consultants:   Cardio   Procedures:   Antimicrobials:   Subjective: Pt c/o not sleeping well    Objective: Vitals:   02/07/21 2002 02/07/21 2349 02/08/21 0500 02/08/21 0503  BP: 99/64 108/77  113/77  Pulse: 83 85  88  Resp: 19   20  Temp: 98.1 F (36.7 C) 97.9 F (36.6 C)    TempSrc: Oral  Oral    SpO2: 99% 98%  96%  Weight:   128.6 kg   Height:        Intake/Output Summary (Last 24 hours) at 02/08/2021 0803 Last data filed at 02/08/2021 0700 Gross per 24 hour  Intake 1801.23 ml  Output 2400 ml  Net -598.77 ml   Filed Weights   02/06/21 0400 02/07/21 0500 02/08/21 0500  Weight: 134.8 kg 134.8 kg 128.6 kg    Examination:  General exam: Appears comfortable  Respiratory system: diminished breath sounds b/l  Cardiovascular system: S1/S2+. No gallops or clicks. B/l LE edema  Gastrointestinal system: Abd is soft, NT, distended & normal bowel sounds  Central nervous system: Alert and oriented. Moves all 4 extremities   Psychiatry: Judgement and insight appear normal. Flat mood and affect      Data Reviewed: I have personally reviewed following labs and imaging studies  CBC: Recent Labs  Lab 02/02/21 1359 02/03/21 0438 02/04/21 0558 02/08/21 0632  WBC 3.3* 3.0* 2.8* 3.5*  NEUTROABS  --  1.9  --   --   HGB 14.3 14.4 13.7 14.1  HCT 42.2 41.6 40.6 41.5  MCV 84.2 83.2 84.1 83.8  PLT 241 223 220 228   Basic Metabolic Panel: Recent Labs  Lab 02/03/21 0714 02/04/21 0558 02/05/21 0415 02/06/21 0639 02/07/21 0354 02/08/21 0632  NA 124* 125* 128* 128* 130* 133*  K 4.4 4.1 3.2* 3.5 3.6 3.9  CL 89* 91* 91* 93* 94* 99  CO2 22 23 25 24 25 24   GLUCOSE 99  90 105* 101* 107* 103*  BUN 11 13 15 13 11 11   CREATININE 1.62* 1.85* 1.74* 1.52* 1.34* 1.35*  CALCIUM 9.2 9.3 9.2 9.4 9.0 9.3  MG 2.2 2.0  --   --   --   --   PHOS  --  3.5  --   --   --   --    GFR: Estimated Creatinine Clearance: 75.7 mL/min (A) (by C-G formula based on SCr of 1.35 mg/dL (H)). Liver Function Tests: No results for input(s): AST, ALT, ALKPHOS, BILITOT, PROT, ALBUMIN in the last 168 hours. No results for input(s): LIPASE, AMYLASE in the last 168 hours. No results for input(s): AMMONIA in the last 168 hours. Coagulation Profile: No results for input(s): INR, PROTIME in the last 168  hours. Cardiac Enzymes: No results for input(s): CKTOTAL, CKMB, CKMBINDEX, TROPONINI in the last 168 hours. BNP (last 3 results) No results for input(s): PROBNP in the last 8760 hours. HbA1C: No results for input(s): HGBA1C in the last 72 hours. CBG: Recent Labs  Lab 02/04/21 0802  GLUCAP 95   Lipid Profile: No results for input(s): CHOL, HDL, LDLCALC, TRIG, CHOLHDL, LDLDIRECT in the last 72 hours. Thyroid Function Tests: No results for input(s): TSH, T4TOTAL, FREET4, T3FREE, THYROIDAB in the last 72 hours. Anemia Panel: No results for input(s): VITAMINB12, FOLATE, FERRITIN, TIBC, IRON, RETICCTPCT in the last 72 hours. Sepsis Labs: No results for input(s): PROCALCITON, LATICACIDVEN in the last 168 hours.  Recent Results (from the past 240 hour(s))  Resp Panel by RT-PCR (Flu A&B, Covid) Nasopharyngeal Swab     Status: None   Collection Time: 02/02/21  6:12 PM   Specimen: Nasopharyngeal Swab; Nasopharyngeal(NP) swabs in vial transport medium  Result Value Ref Range Status   SARS Coronavirus 2 by RT PCR NEGATIVE NEGATIVE Final    Comment: (NOTE) SARS-CoV-2 target nucleic acids are NOT DETECTED.  The SARS-CoV-2 RNA is generally detectable in upper respiratory specimens during the acute phase of infection. The lowest concentration of SARS-CoV-2 viral copies this assay can detect is 138 copies/mL. A negative result does not preclude SARS-Cov-2 infection and should not be used as the sole basis for treatment or other patient management decisions. A negative result may occur with  improper specimen collection/handling, submission of specimen other than nasopharyngeal swab, presence of viral mutation(s) within the areas targeted by this assay, and inadequate number of viral copies(<138 copies/mL). A negative result must be combined with clinical observations, patient history, and epidemiological information. The expected result is Negative.  Fact Sheet for Patients:   02/04/21  Fact Sheet for Healthcare Providers:  BloggerCourse.com  This test is no t yet approved or cleared by the [employer] FDA and  has been authorized for detection and/or diagnosis of SARS-CoV-2 by FDA under an Emergency Use Authorization (EUA). This EUA will remain  in effect (meaning this test can be used) for the duration of the COVID-19 declaration under Section 564(b)(1) of the Act, 21 U.S.C.section 360bbb-3(b)(1), unless the authorization is terminated  or revoked sooner.       Influenza A by PCR NEGATIVE NEGATIVE Final   Influenza B by PCR NEGATIVE NEGATIVE Final    Comment: (NOTE) The Xpert Xpress SARS-CoV-2/FLU/RSV plus assay is intended as an aid in the diagnosis of influenza from Nasopharyngeal swab specimens and should not be used as a sole basis for treatment. Nasal washings and aspirates are unacceptable for Xpert Xpress SARS-CoV-2/FLU/RSV testing.  Fact Sheet for Patients: [place]  Fact Sheet for Healthcare  Providers: [employer]ious[employer]ker.it  This test is not yet approved or cleared by the [place] and has been authorized for detection and/or diagnosis of SARS-CoV-2 by FDA under an Emergency Use Authorization (EUA). This EUA will remain in effect (meaning this test can be used) for the duration of the COVID-19 declaration under Section 564(b)(1) of the Act, 21 U.S.C. section 360bbb-3(b)(1), unless the authorization is terminated or revoked.  Performed at [facility], [address] [place], [place] [address]          Radiology Studies: No results found.      Scheduled Meds: . dapagliflozin propanediol  10 mg Oral Daily  . furosemide  40 mg Intravenous BID  . heparin  5,000 Units Subcutaneous Q8H  . metoprolol succinate  50 mg Oral Daily  . potassium chloride  40 mEq Oral BID  . sacubitril-valsartan  1  tablet Oral BID  . sodium chloride flush  3 mL Intravenous Q12H   Continuous Infusions: . sodium chloride       LOS: 5 days    Time spent: 35 mins    Charise Killian, MD [facility] Pager [phone]  If 7PM-7AM, please contact night-coverage  02/08/2021, 8:03 AM

## 2021-02-09 LAB — CBC
HCT: 39.2 % (ref 39.0–52.0)
Hemoglobin: 13 g/dL (ref 13.0–17.0)
MCH: 28.2 pg (ref 26.0–34.0)
MCHC: 33.2 g/dL (ref 30.0–36.0)
MCV: 85 fL (ref 80.0–100.0)
Platelets: 209 10*3/uL (ref 150–400)
RBC: 4.61 MIL/uL (ref 4.22–5.81)
RDW: 16.4 % — ABNORMAL HIGH (ref 11.5–15.5)
WBC: 3 10*3/uL — ABNORMAL LOW (ref 4.0–10.5)
nRBC: 0 % (ref 0.0–0.2)

## 2021-02-09 LAB — BASIC METABOLIC PANEL
Anion gap: 9 (ref 5–15)
BUN: 12 mg/dL (ref 8–23)
CO2: 24 mmol/L (ref 22–32)
Calcium: 9.1 mg/dL (ref 8.9–10.3)
Chloride: 102 mmol/L (ref 98–111)
Creatinine, Ser: 1.5 mg/dL — ABNORMAL HIGH (ref 0.61–1.24)
GFR, Estimated: 51 mL/min — ABNORMAL LOW (ref >60)
Glucose, Bld: 90 mg/dL (ref 70–99)
Potassium: 4.1 mmol/L (ref 3.5–5.1)
Sodium: 135 mmol/L (ref 135–145)

## 2021-02-09 LAB — HEMOGLOBIN A1C
Hgb A1c MFr Bld: 6.6 % — ABNORMAL HIGH (ref 4.8–5.6)
Mean Plasma Glucose: 142.72 mg/dL

## 2021-02-09 MED ORDER — METOPROLOL SUCCINATE ER 50 MG PO TB24: 50.0000 mg | ORAL_TABLET | Freq: Every day | ORAL | 0 refills | Status: DC

## 2021-02-09 MED ORDER — LIVING WELL WITH DIABETES BOOK
Freq: Once | Status: DC
  Filled 2021-02-09: qty 1

## 2021-02-09 MED ORDER — SACUBITRIL-VALSARTAN 24-26 MG PO TABS: 1.0000 | ORAL_TABLET | Freq: Two times a day (BID) | ORAL | 0 refills | Status: AC

## 2021-02-09 MED ORDER — FUROSEMIDE 40 MG PO TABS: 40.0000 mg | ORAL_TABLET | Freq: Two times a day (BID) | ORAL | 0 refills | Status: DC

## 2021-02-09 MED ORDER — DAPAGLIFLOZIN PROPANEDIOL 10 MG PO TABS: 10.0000 mg | ORAL_TABLET | Freq: Every day | ORAL | 0 refills | Status: AC

## 2021-02-09 NOTE — Care Management Important Message (Signed)
Important Message  Patient Details  Name: Tyler Ochoa MRN: [id number] Date of Birth: 24-Sep-1951   Medicare Important Message Given:  Yes     [name] 02/09/2021, 1:44 PM

## 2021-02-09 NOTE — Progress Notes (Addendum)
   Heart Failure Nurse Navigator Note  HFrEF 25 to 30%.  Severe concentric LVH.  Severe biatrial enlargement.  Right ventricular systolic function is severely decreased.  He presented to the emergency room with complaints of worsening shortness of breath, lower extremity edema extending to his upper legs and scrotal area.  Comorbidities:  Hypertension Hyperlipidemia Osteoarthritis  Medications:  Farxiga 10 mg daily,  Lasix 40 mg transitioning to oral Metoprolol succinate 50 mg daily Entresto 24/26 mg 2 times a day  Labs:  Sodium 135, potassium 4.1, chloride 102, CO2 24, BUN 12, creatinine 1.5 Intake 884 Output 1475 Weight -patient states that he did not stand on his standing scale this morning. Blood pressure 105/72, pulse 82   Assessment:  General-he is awake and alert lying in bed in no acute distress.  HEENT-pupils are equal, normocephalic no JVD appreciated.  Cardiac-heart tones of regular rate and rhythm.  Chest-breath sounds are clear to posterior auscultation.  Abdomen-is rounded soft nontender.  Musculoskeletal-bilateral legs remain with 1+ pitting edema, he states that scrotal swelling continues to improve.  Neurologic-speech is clear, moves all extremities without difficulty.   Met with patient today, he is anxious to be going home.  I teach back method went over what to report to physician.  Needs little reinforcement.  He states that when he is discharged from here today they are going to the grocery store and will be reading labels to make sure that they get low-sodium foods.  Also discussed his new medications of Entresto and Farxiga, he understands that [facility] Glade is not for diabetes but for heart failure treatment.  Stressed the importance of speaking with his physician if he finds that he is unable to a ford these medications because there are other options as far as patient assistance program or that the office may be able to help out with samples.   Also stressed if he felt that he needed to stop the medication to call and talk with his doctor  first.  He voices understanding.  He has a 30-day coupon for the [facility] and also has patient assistant forms which he will take to his appointment.  Also discussed the importance of checking his blood pressure especially with the new medications that he has been placed on.  Went over making sure that he has rested 10 to 15 minutes, both feet are on the floor and that the records of these blood pressure and heart rates long with his weights in a notebook that he could take to his physician's office.  Discussed the importance of wearing his Ted socks, and that he could take them off at night while he sleeps.  Aware that  I would be doing a follow-up phone call tomorrow to see how he is doing and that he would be seen by cardiology at the end of the week.  Spent over 45 minutes with the patient.  Pricilla Riffle RN [facility]

## 2021-02-09 NOTE — Discharge Summary (Addendum)
Physician Discharge Summary  Tyler Ochoa ZOX:[id number] DOB: 1951/02/07 DOA: 02/02/2021  PCP: Queen Slough, MD  Admit date: 02/02/2021 Discharge date: 02/09/2021  Admitted From:  Home  Disposition:  Home w/ home health   Recommendations for Outpatient Follow-up:  1. Follow up with PCP in 1-2 weeks 2. F/u w/ cardio, NP Packer, in 1 week   Home Health: yes Equipment/Devices:  Discharge Condition: stable  CODE STATUS: full  Diet recommendation: Heart Healthy  Brief/Interim Summary: HPI was taken from Dr. Sedalia Muta: Tyler Ochoa is a 70 y.o. male with medical history significant for hypertension, heart failure reduced ejection fraction, is a new diagnosis, presents to the emergency department for chief concerns of shortness of breath that is worse with exertion.  Patient has been having shortness of breath over the last 3 months.  He reports that he has gained unintentionally 30 pounds.  He also endorses bilateral lower extremity edema and scrotal swelling.  He denies scrotal pain.  He states that the main reason he is coming into the hospital because with Lasix compliance he still does not have improved scrotal swelling.  He endorses bilateral lower extremity edema.  He states that with the Lasix use his lower extremity edema has improved.  He was recently diagnosed with heart failure reduced ejection fraction via cardiac echo ordered by his PCP for 01/07/2021.  He was prescribed Lasix 20 mg daily, which she has been taking.  Of note on further questioning, patient drinks 1 gallon of water every day. Patients states that he has been drinking 1 gallon of water per day for a long time as he was told in the past "drinking a lot of water was good for and he gets thirsty.  Occasionally, he consumes 12 ounce beers per day & Sunny D.  He states he does not drink tea, coffee, soda.  Social history: He lives with his wife/partner/girlfriend.  He infrequently drinks alcohol.  He denies tobacco  history and recreational drug use.  Vaccination: Patient is vaccinated for COVID-19  At bedside, he is not in acute distress and not having any difficulty maintaining conversation with me.  He is able to tell me his name, his age, as location of hospital, the current calendar year.  No increase respiratory muscle use with full conversation.  His main concern is the scrotal swelling.  Hospital course from Dr. Mayford Knife 4/2-02/09/21: Pt was found to have CHF exacerbation w/ likely component of cardiorenal syndrome. Pt was put on IV dopamine drip & IV lasix while inpatient. Pt's Cr started to trend down but was trending up to 1.50 on day of discharge. Pt was sent home w/ metoprolol, entresto, lasix, & farxiga as per cardio. Pt will f/u w/ cardio in 1 week. Pt verbalized his understanding. 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 Echo shows EF 25-30%, LV global hypokinesis and diastolic parameters are indeterminate. Continue on IV lasix. Monitor I/Os and daily weights. Received education on fluid and salt intake, pt verbalized his understanding   AKI on CKDIIIa: w/ possible component of cardiorenal. Dopamine was d/c as per cardio. Continue on lasix. Cr is stable from day prior.   Hypervolemic hyponatremia: resolved   Elevated troponins: likely secondary to  demand ischemia    Hypokalemia: WNL today   DM2: continue on farxiga. HbA1c is 6.6   Morbid obesity: BMI 38.1. Complicates overall care & prognosis  Discharge Instructions  Discharge Instructions    Diet - low sodium heart healthy   Complete by: As directed    Discharge instructions   Complete by: As directed     F/u w/ PCP in 1-2 weeks. F/u w/ cardio, NP Packer within 1 week   Increase activity slowly   Complete by: As directed      Allergies as of 02/09/2021   No Known Allergies     Medication List    STOP taking these medications   amLODipine-benazepril 10-20 MG capsule Commonly known as: LOTREL   EXCEDRIN ASPIRIN FREE PO   ibuprofen 800 MG tablet Commonly known as: ADVIL   triamterene-hydrochlorothiazide 37.5-25 MG tablet Commonly known as: MAXZIDE-25     TAKE these medications   acetaminophen 500 MG tablet Commonly known as: TYLENOL Take 500 mg by mouth every 6 (six) hours as needed.   aspirin EC 81 MG tablet Take 81 mg by mouth daily. Swallow whole.   atorvastatin 80 MG tablet Commonly known as: LIPITOR Take 80 mg by mouth at bedtime.   dapagliflozin propanediol 10 MG Tabs tablet Commonly known as: FARXIGA Take 1 tablet (10 mg total) by mouth daily. Start taking on: February 10, 2021   furosemide 40 MG tablet Commonly known as: Lasix Take 1 tablet (40 mg total) by mouth 2 (two) times daily. What changed:   medication strength  how much to take  additional instructions   metoprolol succinate 50 MG 24 hr tablet Commonly known as: TOPROL-XL Take 1 tablet (50 mg total) by mouth daily. Take with or immediately following a meal. Start taking on: February 10, 2021   potassium chloride SA 20 MEQ tablet Commonly known as: KLOR-CON Take 20 mEq by mouth daily.   ProAir HFA 108 (90 Base) MCG/ACT inhaler Generic drug: albuterol Inhale 2 puff using inhaler every four to six hours as needed for wheezing, cough, shortness of breath   sacubitril-valsartan 24-26 MG Commonly known as: ENTRESTO Take 1 tablet by mouth 2 (two) times daily.            Durable Medical Equipment  (From admission, onward)         Start     Ordered   02/09/21 1224  For home use only DME Walker rolling  Once       Question Answer Comment  Walker: With 5 Inch Wheels   Patient needs a walker  to treat with the following condition Generalized weakness      02/09/21 1223          No Known Allergies  Consultations:  Cardio, Dr. Welton Flakes    Procedures/Studies: DG Chest 2 View  Result Date: 02/02/2021 CLINICAL DATA:  Cough, shortness of breath. EXAM: CHEST - 2 VIEW COMPARISON:  December 11, 2020. FINDINGS: Stable cardiomediastinal silhouette. No pneumothorax or pleural effusion is noted. Both lungs are clear. The visualized skeletal structures are unremarkable. IMPRESSION: No active cardiopulmonary disease. Electronically Signed   By: Lupita Raider M.D.   On: 02/02/2021 18:26      Subjective: Pt c/o swelling but decreased from day prior    Discharge Exam: Vitals:   02/09/21 0815 02/09/21 1215  BP: 118/72 105/72  Pulse: 88 82  Resp: 15 16  Temp: (!) 97.5 F (36.4 C) 98.3 F (36.8 C)  SpO2: 100% 97%  Vitals:   02/08/21 2017 02/09/21 0443 02/09/21 0815 02/09/21 1215  BP: 103/73 (!) 87/50 118/72 105/72  Pulse: 80 77 88 82  Resp: 18 16 15 16   Temp: 97.9 F (36.6 C) (!) 97.4 F (36.3 C) (!) 97.5 F (36.4 C) 98.3 F (36.8 C)  TempSrc: Oral Oral Oral Oral  SpO2: 95% 95% 100% 97%  Weight:  129 kg    Height:        General: Pt is alert, awake, not in acute distress Cardiovascular: S1/S2 +, no rubs, no gallops Respiratory: CTA bilaterally, no wheezing, no rhonchi Abdominal: Soft, NT, obese, bowel sounds + Extremities: b/l LE edema, no cyanosis    The results of significant diagnostics from this hospitalization (including imaging, microbiology, ancillary and laboratory) are listed below for reference.     Microbiology: Recent Results (from the past 240 hour(s))  Resp Panel by RT-PCR (Flu A&B, Covid) Nasopharyngeal Swab     Status: None   Collection Time: 02/02/21  6:12 PM   Specimen: Nasopharyngeal Swab; Nasopharyngeal(NP) swabs in vial transport medium  Result Value Ref Range Status   SARS Coronavirus 2 by RT PCR NEGATIVE NEGATIVE Final    Comment:  (NOTE) SARS-CoV-2 target nucleic acids are NOT DETECTED.  The SARS-CoV-2 RNA is generally detectable in upper respiratory specimens during the acute phase of infection. The lowest concentration of SARS-CoV-2 viral copies this assay can detect is 138 copies/mL. A negative result does not preclude SARS-Cov-2 infection and should not be used as the sole basis for treatment or other patient management decisions. A negative result may occur with  improper specimen collection/handling, submission of specimen other than nasopharyngeal swab, presence of viral mutation(s) within the areas targeted by this assay, and inadequate number of viral copies(<138 copies/mL). A negative result must be combined with clinical observations, patient history, and epidemiological information. The expected result is Negative.  Fact Sheet for Patients:  BloggerCourse.comhttps://www.fda.gov/media/152166/download  Fact Sheet for Healthcare Providers:  SeriousBroker.ithttps://www.fda.gov/media/152162/download  This test is no t yet approved or cleared by the [place] FDA and  has been authorized for detection and/or diagnosis of SARS-CoV-2 by FDA under an Emergency Use Authorization (EUA). This EUA will remain  in effect (meaning this test can be used) for the duration of the COVID-19 declaration under Section 564(b)(1) of the Act, 21 U.S.C.section 360bbb-3(b)(1), unless the authorization is terminated  or revoked sooner.       Influenza A by PCR NEGATIVE NEGATIVE Final   Influenza B by PCR NEGATIVE NEGATIVE Final    Comment: (NOTE) The Xpert Xpress SARS-CoV-2/FLU/RSV plus assay is intended as an aid in the diagnosis of influenza from Nasopharyngeal swab specimens and should not be used as a sole basis for treatment. Nasal washings and aspirates are unacceptable for Xpert Xpress SARS-CoV-2/FLU/RSV testing.  Fact Sheet for Patients: BloggerCourse.comhttps://www.fda.gov/media/152166/download  Fact Sheet for Healthcare  Providers: SeriousBroker.ithttps://www.fda.gov/media/152162/download  This test is not yet approved or cleared by the [place] FDA and has been authorized for detection and/or diagnosis of SARS-CoV-2 by FDA under an Emergency Use Authorization (EUA). This EUA will remain in effect (meaning this test can be used) for the duration of the COVID-19 declaration under Section 564(b)(1) of the Act, 21 U.S.C. section 360bbb-3(b)(1), unless the authorization is terminated or revoked.  Performed at [facility], [address] [place], [place] [address]      Labs: BNP (last 3 results) Recent Labs    02/02/21 1359 02/05/21 0415  BNP 449.3* 1,011.5*   Basic Metabolic  Panel: Recent Labs  Lab 02/03/21 0714 02/04/21 0558 02/05/21 0415 02/06/21 0639 02/07/21 0354 02/08/21 0632 02/09/21 0533  NA 124* 125* 128* 128* 130* 133* 135  K 4.4 4.1 3.2* 3.5 3.6 3.9 4.1  CL 89* 91* 91* 93* 94* 99 102  CO2 22 23 25 24 25 24 24   GLUCOSE 99 90 105* 101* 107* 103* 90  BUN 11 13 15 13 11 11 12   CREATININE 1.62* 1.85* 1.74* 1.52* 1.34* 1.35* 1.50*  CALCIUM 9.2 9.3 9.2 9.4 9.0 9.3 9.1  MG 2.2 2.0  --   --   --   --   --   PHOS  --  3.5  --   --   --   --   --    Liver Function Tests: No results for input(s): AST, ALT, ALKPHOS, BILITOT, PROT, ALBUMIN in the last 168 hours. No results for input(s): LIPASE, AMYLASE in the last 168 hours. No results for input(s): AMMONIA in the last 168 hours. CBC: Recent Labs  Lab 02/03/21 0438 02/04/21 0558 02/08/21 0632 02/09/21 0533  WBC 3.0* 2.8* 3.5* 3.0*  NEUTROABS 1.9  --   --   --   HGB 14.4 13.7 14.1 13.0  HCT 41.6 40.6 41.5 39.2  MCV 83.2 84.1 83.8 85.0  PLT 223 220 228 209   Cardiac Enzymes: No results for input(s): CKTOTAL, CKMB, CKMBINDEX, TROPONINI in the last 168 hours. BNP: Invalid input(s): POCBNP CBG: Recent Labs  Lab 02/04/21 0802  GLUCAP 95   D-Dimer No results for input(s): DDIMER in the last 72 hours. Hgb A1c Recent Labs     02/09/21 0533  HGBA1C 6.6*   Lipid Profile No results for input(s): CHOL, HDL, LDLCALC, TRIG, CHOLHDL, LDLDIRECT in the last 72 hours. Thyroid function studies No results for input(s): TSH, T4TOTAL, T3FREE, THYROIDAB in the last 72 hours.  Invalid input(s): FREET3 Anemia work up No results for input(s): VITAMINB12, FOLATE, FERRITIN, TIBC, IRON, RETICCTPCT in the last 72 hours. Urinalysis    Component Value Date/Time   COLORURINE YELLOW (A) 02/02/2021 0715   APPEARANCEUR CLEAR (A) 02/02/2021 0715   LABSPEC 1.006 02/02/2021 0715   PHURINE 7.0 02/02/2021 0715   GLUCOSEU NEGATIVE 02/02/2021 0715   HGBUR NEGATIVE 02/02/2021 0715   BILIRUBINUR NEGATIVE 02/02/2021 0715   KETONESUR NEGATIVE 02/02/2021 0715   PROTEINUR NEGATIVE 02/02/2021 0715   NITRITE NEGATIVE 02/02/2021 0715   LEUKOCYTESUR NEGATIVE 02/02/2021 0715   Sepsis Labs Invalid input(s): PROCALCITONIN,  WBC,  LACTICIDVEN Microbiology Recent Results (from the past 240 hour(s))  Resp Panel by RT-PCR (Flu A&B, Covid) Nasopharyngeal Swab     Status: None   Collection Time: 02/02/21  6:12 PM   Specimen: Nasopharyngeal Swab; Nasopharyngeal(NP) swabs in vial transport medium  Result Value Ref Range Status   SARS Coronavirus 2 by RT PCR NEGATIVE NEGATIVE Final    Comment: (NOTE) SARS-CoV-2 target nucleic acids are NOT DETECTED.  The SARS-CoV-2 RNA is generally detectable in upper respiratory specimens during the acute phase of infection. The lowest concentration of SARS-CoV-2 viral copies this assay can detect is 138 copies/mL. A negative result does not preclude SARS-Cov-2 infection and should not be used as the sole basis for treatment or other patient management decisions. A negative result may occur with  improper specimen collection/handling, submission of specimen other than nasopharyngeal swab, presence of viral mutation(s) within the areas targeted by this assay, and inadequate number of viral copies(<138  copies/mL). A negative result must be combined with clinical observations, patient  history, and epidemiological information. The expected result is Negative.  Fact Sheet for Patients:  BloggerCourse.com  Fact Sheet for Healthcare Providers:  SeriousBroker.it  This test is no t yet approved or cleared by the [place] FDA and  has been authorized for detection and/or diagnosis of SARS-CoV-2 by FDA under an Emergency Use Authorization (EUA). This EUA will remain  in effect (meaning this test can be used) for the duration of the COVID-19 declaration under Section 564(b)(1) of the Act, 21 U.S.C.section 360bbb-3(b)(1), unless the authorization is terminated  or revoked sooner.       Influenza A by PCR NEGATIVE NEGATIVE Final   Influenza B by PCR NEGATIVE NEGATIVE Final    Comment: (NOTE) The Xpert Xpress SARS-CoV-2/FLU/RSV plus assay is intended as an aid in the diagnosis of influenza from Nasopharyngeal swab specimens and should not be used as a sole basis for treatment. Nasal washings and aspirates are unacceptable for Xpert Xpress SARS-CoV-2/FLU/RSV testing.  Fact Sheet for Patients: BloggerCourse.com  Fact Sheet for Healthcare Providers: SeriousBroker.it  This test is not yet approved or cleared by the [place] FDA and has been authorized for detection and/or diagnosis of SARS-CoV-2 by FDA under an Emergency Use Authorization (EUA). This EUA will remain in effect (meaning this test can be used) for the duration of the COVID-19 declaration under Section 564(b)(1) of the Act, 21 U.S.C. section 360bbb-3(b)(1), unless the authorization is terminated or revoked.  Performed at [facility], [address] [place], [place] [address]      Time coordinating discharge: Over 30 minutes  SIGNED:   Charise Killian, MD  [facility] 02/09/2021, 2:17  PM Pager   If 7PM-7AM, please contact night-coverage

## 2021-02-09 NOTE — TOC Transition Note (Signed)
Transition of Care [facility]) - CM/SW Discharge Note   Patient Details  Name: Tyler Ochoa MRN: [id number] Date of Birth: 09-Jun-1951  [facility]) CM/SW Contact:  Hetty Ely, RN Phone Number: 02/09/2021, 12:47 PM     Final next level of care: Home w Home Health Services Barriers to Discharge: Barriers Resolved   Patient Goals and CMS Choice Patient states their goals for this hospitalization and ongoing recovery are:: To return home.   Choice offered to / list presented to : NA  Discharge Placement                  Name of family member notified: Patient states he will call his Girlfriend Patient and family notified of of transfer: 02/09/21  Discharge Plan and Services                DME Arranged: Dan Humphreys rolling DME Agency: [employer] Date DME Agency Contacted: 02/09/21 Time DME Agency Contacted: 1244 Representative spoke with at DME Agency: [place] HH Arranged: PT,RN HH Agency: [facility] Date [facility] Agency Contacted: 02/09/21 Time HH Agency Contacted: 1245 Representative spoke with at [facility] Agency: [place]  Social Determinants of Health (SDOH) Interventions     Readmission Risk Interventions No flowsheet data found.

## 2021-02-09 NOTE — Progress Notes (Signed)
SUBJECTIVE: Patient resting comfortably in bed. Denies chest pain or dyspnea.    Vitals:   02/08/21 1235 02/08/21 2017 02/09/21 0443 02/09/21 0815  BP: 104/66 103/73 (!) 87/50 118/72  Pulse: 81 80 77 88  Resp: 19 18 16 15   Temp: 97.6 F (36.4 C) 97.9 F (36.6 C) (!) 97.4 F (36.3 C) (!) 97.5 F (36.4 C)  TempSrc: Oral Oral Oral Oral  SpO2: 95% 95% 95% 100%  Weight:   129 kg   Height:        Intake/Output Summary (Last 24 hours) at 02/09/2021 1007 Last data filed at 02/09/2021 0929 Gross per 24 hour  Intake 608.17 ml  Output 1580 ml  Net -971.83 ml    LABS: Basic Metabolic Panel: Recent Labs    02/08/21 0632 02/09/21 0533  NA 133* 135  K 3.9 4.1  CL 99 102  CO2 24 24  GLUCOSE 103* 90  BUN 11 12  CREATININE 1.35* 1.50*  CALCIUM 9.3 9.1   Liver Function Tests: No results for input(s): AST, ALT, ALKPHOS, BILITOT, PROT, ALBUMIN in the last 72 hours. No results for input(s): LIPASE, AMYLASE in the last 72 hours. CBC: Recent Labs    02/08/21 0632 02/09/21 0533  WBC 3.5* 3.0*  HGB 14.1 13.0  HCT 41.5 39.2  MCV 83.8 85.0  PLT 228 209   Cardiac Enzymes: No results for input(s): CKTOTAL, CKMB, CKMBINDEX, TROPONINI in the last 72 hours. BNP: Invalid input(s): POCBNP D-Dimer: No results for input(s): DDIMER in the last 72 hours. Hemoglobin A1C: No results for input(s): HGBA1C in the last 72 hours. Fasting Lipid Panel: No results for input(s): CHOL, HDL, LDLCALC, TRIG, CHOLHDL, LDLDIRECT in the last 72 hours. Thyroid Function Tests: No results for input(s): TSH, T4TOTAL, T3FREE, THYROIDAB in the last 72 hours.  Invalid input(s): FREET3 Anemia Panel: No results for input(s): VITAMINB12, FOLATE, FERRITIN, TIBC, IRON, RETICCTPCT in the last 72 hours.   PHYSICAL EXAM General: Well developed, well nourished, in no acute distress HEENT:  Normocephalic and atramatic Neck:  No JVD.  Lungs: Clear bilaterally to auscultation and percussion. Heart: HRRR . Normal S1  and S2 without gallops or murmurs.  Abdomen: Bowel sounds are positive, abdomen soft and non-tender  Msk:  Back normal, normal gait. Normal strength and tone for age. Extremities: +1 pitting edema BLE. Non pitting scrotal edema.   Neuro: Alert and oriented X 3. Psych:  Good affect, responds appropriately  TELEMETRY: NSR 84/bpm  ASSESSMENT AND PLAN: Patient presenting to the emergency department with worsening dyspnea as well as BLE and scrotal edema. For Acute HFrEF, please continuemetoprolol succinate 50 mg, Entresto 24-26, and Farxiga 10mg . Further titration can be completed as an outpatient.Edema has much improved and patient;s weight is down 14.86kg this admission. Recommend transitioning lasix to oral dosing 40mg  bid. Hyponatremia continues to resolve. From a cardiac point of view, patient is stable for discharge and will be seen in our clinic later this week or next week.  Principal Problem:   Acute exacerbation of CHF (congestive heart failure) (HCC) Active Problems:   Essential hypertension   Hyperlipidemia   Truncal obesity   Osteoarthritis of both knees    11-12-2002, NP-C 02/09/2021 10:07 AM

## 2021-02-09 NOTE — Progress Notes (Signed)
Inpatient Diabetes Program Recommendations  AACE/ADA: New Consensus Statement on Inpatient Glycemic Control   Target Ranges:  Prepandial:   less than 140 mg/dL      Peak postprandial:   less than 180 mg/dL (1-2 hours)      Critically ill patients:  140 - 180 mg/dL   Results for LEVI, KLAIBER (MRN [id number]) as of 02/09/2021 10:23  Ref. Range 02/06/2021 06:39 02/07/2021 03:54 02/08/2021 06:32 02/09/2021 05:33  Glucose Latest Ref Range: 70 - 99 mg/dL 867 (H) 619 (H) 509 (H) 90  Results for KNOX, CERVI (MRN [id number]) as of 02/09/2021 10:23  Ref. Range 02/02/2021 13:59  Glucose Latest Ref Range: 70 - 99 mg/dL 099 (H)   Review of Glycemic Control  Diabetes history: No Outpatient Diabetes medications: NA Current orders for Inpatient glycemic control: Farxiga 10 mg daily  Inpatient Diabetes Program Recommendations:    HbgA1C: A1C in process.  NOTE: Noted consult for new DM dx. In reviewing chart, noted patient has no DM hx noted and initial glucose was 127 mg/dl on 8/33/82. Patient is ordered Farxiga 10 mg daily (ordered by Glo Herring, NP on 02/04/21). Called patient over phone to inquire about any DM dx and patient states that he does not have any prior DM hx. Patient states that he has a doctor in the room and asked that I call back. Informed patient I would come speak with him today.  Spoke with patient at bedside. Patient states that Dr. Mayford Knife came back today and told him he did not have DM. Patient states that he has a history of borderline DM for years but he admits he has not really followed a Carb Modified diet. Patient states that he is not sure what his glucose had been on the past and not aware of what an A1C is. Discussed glucose values and A1C and explained what an A1C is. Informed patient that his current A1C is still in process.  Discussed criteria for DM and preDM.   Discussed Carb modified diet and encouraged patient to start following Carb modified diet. Discussed Marcelline Deist and  how it works and impact on glucose. Explained to patient that Marcelline Deist has low risk of hypoglycemia but I was going to educate him on it incase he started getting symptoms. Discussed hypoglycemia along with treatment. Informed patient that if he is discharged on Farxiga then it would be recommended that he is checking glucose at least once a day and anytime he had symptoms of hypoglycemia.  Patient states that if the Marcelline Deist is expensive, he will not be able to get the medication. Patient states he is not sure what his copay would be yet if it is prescribed. Encouraged patient to follow up with PCP regarding glycemic control and ask about his A1C results if not informed of results prior to discharge from the hospital. Patient verbalized understanding of information discussed and states he has no questions at this time. Communicated with Dr. Mayford Knife of conversation with patient and recommendation to have patient check glucose at home if discharged on Farxiga.  Thanks, Orlando Penner, RN, MSN, CDE Diabetes Coordinator Inpatient Diabetes Program [phone] (Team Pager from 8am to 5pm)

## 2021-02-09 NOTE — TOC Transition Note (Signed)
Transition of Care [facility]) - CM/SW Discharge Note   Patient Details  Name: Tyler Ochoa MRN: [id number] Date of Birth: May 13, 1951  Transition of Care [facility]) CM/SW Contact:  Hetty Ely, RN Phone Number: 02/09/2021, 3:38 PM   Clinical Narrative:   Patient given Blood Pressure home monitoring unit with instructions to check daily same time after emptying bladder, document results and take log with him to scheduled medical visits. Patient states Hear Nurse provided him with instructions. Patient and significant other also given instructions on health eating tips, voices understanding.    Final next level of care: Home w Home Health Services Barriers to Discharge: Barriers Resolved   Patient Goals and CMS Choice Patient states their goals for this hospitalization and ongoing recovery are:: To return home.   Choice offered to / list presented to : NA  Discharge Placement                  Name of family member notified: Patient states he will call his Girlfriend Patient and family notified of of transfer: 02/09/21  Discharge Plan and Services                DME Arranged: Dan Humphreys rolling DME Agency: [employer] Date DME Agency Contacted: 02/09/21 Time DME Agency Contacted: 1244 Representative spoke with at DME Agency: [place] HH Arranged: PT,RN HH Agency: [facility] Date [facility] Agency Contacted: 02/09/21 Time HH Agency Contacted: 1245 Representative spoke with at [facility] Agency: [place]  Social Determinants of Health (SDOH) Interventions     Readmission Risk Interventions No flowsheet data found.

## 2021-02-09 NOTE — Progress Notes (Signed)
Physical Therapy Treatment Patient Details Name: Tyler Ochoa MRN: [id number] DOB: January 18, 1951 Today's Date: 02/09/2021    History of Present Illness Pt is a 70 yo M admitted to [facility] on 02/02/21 for worsening leg edema and persistent scrotal edema. Pt was sick in Jan and believes to have had COVID; states he's had worsening edema and SOB since. PMH significant for: HTN, HLD, and new dx of CHF. Elevated troponin likely due to demand ischemia.    PT Comments    Pt is making good progress towards goals with increased ambulation distance this date. Does well with bari RW and regular RW, prefers RW. Messaged TOC to assess for home use. Plans to discharge this date. Asked if he felt confident with stairs. He explained technique and said he didn't feel he needed to perform as our stairs weren't like his at home. Agreeable to ambulate. Will continue to progress as able.   Follow Up Recommendations  Home health PT;Supervision - Intermittent     Equipment Recommendations  Rolling walker with 5" wheels    Recommendations for Other Services       Precautions / Restrictions Precautions Precautions: Fall Precaution Comments: has bil knee braces for transfers/gait, fluid restriction Restrictions Weight Bearing Restrictions: No    Mobility  Bed Mobility Overal bed mobility: Modified Independent             General bed mobility comments: safe technique    Transfers Overall transfer level: Needs assistance Equipment used: Rolling walker (2 wheeled) Transfers: Sit to/from Stand Sit to Stand: Supervision         General transfer comment: safe technique with upright posture. Bilat knee braces donned  Ambulation/Gait Ambulation/Gait assistance: Supervision Gait Distance (Feet): 200 Feet Assistive device: Rolling walker (2 wheeled) Gait Pattern/deviations: Step-through pattern     General Gait Details: Pt with increased fatigue with further distance, however no rest break  required. Reciprocal gait pattern noted. Used bari RW and then additional 20' with RW. Pt prefers regular RW.   Stairs             Wheelchair Mobility    Modified Rankin (Stroke Patients Only)       Balance Overall balance assessment: Needs assistance Sitting-balance support: No upper extremity supported;Feet supported Sitting balance-Leahy Scale: Normal     Standing balance support: Bilateral upper extremity supported Standing balance-Leahy Scale: Good                              Cognition Arousal/Alertness: Awake/alert Behavior During Therapy: WFL for tasks assessed/performed Overall Cognitive Status: Within Functional Limits for tasks assessed                                        Exercises      General Comments        Pertinent Vitals/Pain Pain Assessment: No/denies pain    Home Living                      Prior Function            PT Goals (current goals can now be found in the care plan section) Acute Rehab PT Goals Patient Stated Goal: to get stronger and get my heart better so I can eventually get knee surgery. PT Goal Formulation: With patient/family Time For Goal Achievement: 02/22/21  Potential to Achieve Goals: Good Progress towards PT goals: Progressing toward goals    Frequency    Min 2X/week      PT Plan Current plan remains appropriate    Co-evaluation              AM-PAC PT "6 Clicks" Mobility   Outcome Measure  Help needed turning from your back to your side while in a flat bed without using bedrails?: None Help needed moving from lying on your back to sitting on the side of a flat bed without using bedrails?: None Help needed moving to and from a bed to a chair (including a wheelchair)?: A Little Help needed standing up from a chair using your arms (e.g., wheelchair or bedside chair)?: A Little Help needed to walk in hospital room?: A Little Help needed climbing 3-5 steps with  a railing? : A Lot 6 Click Score: 19    End of Session   Activity Tolerance: Patient tolerated treatment well Patient left: in bed (seated EOB) Nurse Communication: Mobility status PT Visit Diagnosis: Unsteadiness on feet (R26.81);Muscle weakness (generalized) (M62.81);Difficulty in walking, not elsewhere classified (R26.2);Pain     Time: 3009-2330 PT Time Calculation (min) (ACUTE ONLY): 28 min  Charges:  $Gait Training: 23-37 mins                     Elizabeth Palau, [place], DPT [phone]    Tyler Ochoa 02/09/2021, 1:19 PM

## 2021-02-10 ENCOUNTER — Telehealth: Payer: Self-pay

## 2021-02-10 NOTE — Telephone Encounter (Signed)
Patient called in day after discharge, he had questions about his medications.  He states he did not know what to do about his potassium.  Went over his complete list, also the ones that were discontinued at discharge.  He is taking as ordered.  He states he spoke with his pharmacist yesterday.  The Sherryll Burger is going to cost him 45 dollars a month- he states he can afford that but the farxiga is going to cost 95 dollars and can not afford that,  Asked him to discuss with Beryle Beams when he sees him at the office at his appointment.  Discussed his weights, by the scale this AM he was 232#.  No complaints of increasing SOB or increased swelling.  Tresa Endo RN CHFN

## 2021-02-13 ENCOUNTER — Ambulatory Visit: Payer: Medicare HMO | Admitting: Cardiovascular Disease

## 2021-03-02 NOTE — Progress Notes (Deleted)
   Patient ID: Tyler Ochoa, male    DOB: [birth year], 70 y.o.   MRN: [id number]  HPI  Tyler Ochoa is a 70 y/o male with a history of  Echo report from 01/07/21 reviewed and showed an EF of 25-30% along with severe LVH and mild Tyler.   Admitted 02/02/21 due to shortness of breath, edema and weight gain. Cardiology consult obtained. Was drinking ~ 1 gallon of water/ day. Given IV dopamine and IV lasix. Transitioned to oral medications. Elevated troponin thought to be due to demand ischemia. Discharged after 7 days.   He presents today for his initial visit with a chief complaint of  Review of Systems    Physical Exam    Assessment & Plan:  1: Chronic heart failure with reduced ejection fraction- - NYHA class - BNP 02/05/21 was 1011.5  2: HTN- - BP - BMP 02/09/21 reviewed and showed sodium 135, potassium 4.1, creatinine 1.5 and GFR 51  3: DM- - A1c 02/09/21 was 6.6%

## 2021-03-03 ENCOUNTER — Telehealth: Payer: Self-pay | Admitting: Family

## 2021-03-03 ENCOUNTER — Ambulatory Visit: Payer: Medicare HMO | Admitting: Family

## 2021-03-03 NOTE — Telephone Encounter (Signed)
Patient did not show for his Heart Failure Clinic appointment on 03/03/21. Will attempt to reschedule.

## 2021-03-06 ENCOUNTER — Telehealth: Payer: Self-pay

## 2021-03-06 NOTE — Telephone Encounter (Signed)
   Heart failure  Received phone call from patient's his significant other, Tyler Ochoa.  States that the last 2 days he is complained of feeling very weak, his feet and legs hurt so much that he cannot stand on his feet.  Not weighed himself for the last 2 days, prior to that he weighed 225 pounds.  On admission to the hospital he weighed 326 pounds, at the time of discharge he weighed 283 pounds.  States that he does not have much of an appetite he has been nauseated.  Spoke with Clarisa Kindred NP in the heart failure clinic, she felt that needed to be seen and have blood work done.  Recommended that he go to the emergency room.  Delorise Shiner states that he is not able to stand to get to the vehicle, recommended that she call the ambulance.  She voices understanding.  Tresa Endo RN CHFN

## 2021-03-07 ENCOUNTER — Emergency Department
Admission: EM | Admit: 2021-03-07 | Discharge: 2021-03-07 | Disposition: A | Discharge status: Home / Self Care | Payer: Medicare HMO | Attending: Emergency Medicine | Admitting: Emergency Medicine

## 2021-03-07 ENCOUNTER — Other Ambulatory Visit: Payer: Self-pay

## 2021-03-07 DIAGNOSIS — Z79899 Other long term (current) drug therapy: Secondary | ICD-10-CM | POA: Diagnosis not present

## 2021-03-07 DIAGNOSIS — I509 Heart failure, unspecified: Secondary | ICD-10-CM | POA: Insufficient documentation

## 2021-03-07 DIAGNOSIS — R531 Weakness: Secondary | ICD-10-CM | POA: Insufficient documentation

## 2021-03-07 DIAGNOSIS — I110 Hypertensive heart disease with heart failure: Secondary | ICD-10-CM | POA: Insufficient documentation

## 2021-03-07 DIAGNOSIS — R748 Abnormal levels of other serum enzymes: Secondary | ICD-10-CM | POA: Diagnosis not present

## 2021-03-07 DIAGNOSIS — Z7982 Long term (current) use of aspirin: Secondary | ICD-10-CM | POA: Diagnosis not present

## 2021-03-07 DIAGNOSIS — R82998 Other abnormal findings in urine: Secondary | ICD-10-CM | POA: Insufficient documentation

## 2021-03-07 HISTORY — DX: Heart failure, unspecified: I50.9

## 2021-03-07 LAB — CBC
HCT: 49.4 % (ref 39.0–52.0)
Hemoglobin: 16.1 g/dL (ref 13.0–17.0)
MCH: 28.7 pg (ref 26.0–34.0)
MCHC: 32.6 g/dL (ref 30.0–36.0)
MCV: 88.1 fL (ref 80.0–100.0)
Platelets: 272 10*3/uL (ref 150–400)
RBC: 5.61 MIL/uL (ref 4.22–5.81)
RDW: 17.5 % — ABNORMAL HIGH (ref 11.5–15.5)
WBC: 6.3 10*3/uL (ref 4.0–10.5)
nRBC: 0 % (ref 0.0–0.2)

## 2021-03-07 LAB — HEPATIC FUNCTION PANEL
ALT: 19 U/L (ref 0–44)
AST: 43 U/L — ABNORMAL HIGH (ref 15–41)
Albumin: 3.3 g/dL — ABNORMAL LOW (ref 3.5–5.0)
Alkaline Phosphatase: 121 U/L (ref 38–126)
Bilirubin, Direct: 2.3 mg/dL — ABNORMAL HIGH (ref 0.0–0.2)
Indirect Bilirubin: 2.3 mg/dL — ABNORMAL HIGH (ref 0.3–0.9)
Total Bilirubin: 4.6 mg/dL — ABNORMAL HIGH (ref 0.3–1.2)
Total Protein: 7.9 g/dL (ref 6.5–8.1)

## 2021-03-07 LAB — URINALYSIS, COMPLETE (UACMP) WITH MICROSCOPIC
Bacteria, UA: NONE SEEN
Bilirubin Urine: NEGATIVE
Glucose, UA: >=500 mg/dL — AB
Ketones, ur: NEGATIVE mg/dL
Leukocytes,Ua: NEGATIVE
Nitrite: NEGATIVE
Protein, ur: 30 mg/dL — AB
Specific Gravity, Urine: 1.01 (ref 1.005–1.030)
pH: 5 (ref 5.0–8.0)

## 2021-03-07 LAB — BASIC METABOLIC PANEL
Anion gap: 15 (ref 5–15)
BUN: 31 mg/dL — ABNORMAL HIGH (ref 8–23)
CO2: 23 mmol/L (ref 22–32)
Calcium: 9.4 mg/dL (ref 8.9–10.3)
Chloride: 100 mmol/L (ref 98–111)
Creatinine, Ser: 1.63 mg/dL — ABNORMAL HIGH (ref 0.61–1.24)
GFR, Estimated: 46 mL/min — ABNORMAL LOW (ref >60)
Glucose, Bld: 106 mg/dL — ABNORMAL HIGH (ref 70–99)
Potassium: 3.5 mmol/L (ref 3.5–5.1)
Sodium: 138 mmol/L (ref 135–145)

## 2021-03-07 LAB — TROPONIN I (HIGH SENSITIVITY)
Troponin I (High Sensitivity): 46 ng/L — ABNORMAL HIGH (ref <18)
Troponin I (High Sensitivity): 46 ng/L — ABNORMAL HIGH (ref <18)

## 2021-03-07 MED ORDER — METOPROLOL SUCCINATE ER 50 MG PO TB24: 50.0000 mg | ORAL_TABLET | Freq: Every day | ORAL | 2 refills | Status: DC

## 2021-03-07 NOTE — ED Provider Notes (Signed)
[facility] Emergency Department Provider Note  Time seen: 5:06 PM  I have reviewed the triage vital signs and the nursing notes.   HISTORY  Chief Complaint abnormal labs   HPI Tyler Ochoa is a 70 y.o. male with a past medical history of hypertension, hyperlipidemia, CHF, peripheral edema, presents to the emergency department for weakness.  According to the patient since being discharged from the hospital 3 weeks ago after an admission for CHF exacerbation and significant peripheral edema patient is continued to lose weight.  He is taking his fluid pill every day and states he is urinating a significant amount, but recently has urine has started to drop off and become more dark.  Patient is on a fluid restricted diet as well.  Patient states nearly 80 pound weight loss since his admission.  Patient states his legs were extremely swollen and now they are minimally swollen.  States he has been having some trouble walking today due to pain in his feet and weakness in his legs.  Denies any unilateral symptoms.  Patient called the heart failure clinic and they were concerned so they asked the patient to come to the emergency department for evaluation.  Patient also states he has been experiencing dark urine/hematuria for the past several days and wanted this evaluated as well.  Patient denies any chest pain.  No shortness of breath.   Past Medical History:  Diagnosis Date  . CHF (congestive heart failure) (HCC)   . Hyperlipidemia   . Hypertension   . Peripheral edema     Patient Active Problem List   Diagnosis Date Noted  . Acute exacerbation of CHF (congestive heart failure) (HCC) 02/02/2021  . Essential hypertension 02/02/2021  . Hyperlipidemia 02/02/2021  . 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  . Osteoarthritis of both knees 02/02/2021    No past surgical history on file.  Prior to Admission medications   Medication Sig Start Date End Date Taking? Authorizing  Provider  acetaminophen (TYLENOL) 500 MG tablet Take 500 mg by mouth every 6 (six) hours as needed.    [provider]  aspirin EC 81 MG tablet Take 81 mg by mouth daily. Swallow whole.    [provider]  atorvastatin (LIPITOR) 80 MG tablet Take 80 mg by mouth at bedtime. 12/08/20   [provider]  dapagliflozin propanediol (FARXIGA) 10 MG TABS tablet Take 1 tablet (10 mg total) by mouth daily. 02/10/21 03/12/21  Charise Killian, MD  furosemide (LASIX) 40 MG tablet Take 1 tablet (40 mg total) by mouth 2 (two) times daily. 02/09/21 03/11/21  Charise Killian, MD  metoprolol succinate (TOPROL-XL) 50 MG 24 hr tablet Take 1 tablet (50 mg total) by mouth daily. Take with or immediately following a meal. 02/10/21 03/12/21  Charise Killian, MD  potassium chloride SA (KLOR-CON) 20 MEQ tablet Take 20 mEq by mouth daily. 01/26/21   [provider]  PROAIR HFA 108 (90 Base) MCG/ACT inhaler Inhale 2 puff using inhaler every four to six hours as needed for wheezing, cough, shortness of breath Patient not taking: No sig reported 12/09/20   [provider]  sacubitril-valsartan (ENTRESTO) 24-26 MG Take 1 tablet by mouth 2 (two) times daily. 02/09/21 03/11/21  Charise Killian, MD    No Known Allergies  No family history on file.  Social History Social History   Tobacco Use  . Smoking status: Never Smoker  . Smokeless tobacco: Never Used  Vaping Use  . Vaping Use:  Never used    Review of Systems Constitutional: Negative for fever.  States generalized weakness. Cardiovascular: Negative for chest pain. Respiratory: Negative for shortness of breath. Gastrointestinal: Negative for abdominal pain.  No vomiting or diarrhea. Genitourinary: Dark urine/possible bloody urine. Musculoskeletal: Bilateral feet pain Skin: Negative for skin complaints  Neurological: Negative for headache All other ROS negative  ____________________________________________   PHYSICAL  EXAM:  VITAL SIGNS: ED Triage Vitals  Enc Vitals Group     BP 03/07/21 1400 115/80     Pulse Rate 03/07/21 1400 88     Resp 03/07/21 1400 20     Temp 03/07/21 1400 97.9 F (36.6 C)     Temp Source 03/07/21 1400 Oral     SpO2 03/07/21 1400 98 %     Weight 03/07/21 1400 240 lb (108.9 kg)     Height 03/07/21 1400 6\' 2"  (1.88 m)     Head Circumference --      Peak Flow --      Pain Score 03/07/21 1406 0     Pain Loc --      Pain Edu? --      Excl. in GC? --    Constitutional: Alert and oriented. Well appearing and in no distress. Eyes: Normal exam ENT      Head: Normocephalic and atraumatic.      Mouth/Throat: Mucous membranes are moist. Cardiovascular: Normal rate, regular rhythm. No murmur Respiratory: Normal respiratory effort without tachypnea nor retractions. Breath sounds are clear  Gastrointestinal: Soft and nontender. No distention.   Musculoskeletal: Nontender, mild lower extremity edema bilaterally. Neurologic:  Normal speech and language. No gross focal neurologic deficits  Skin:  Skin is warm, dry and intact.  Psychiatric: Mood and affect are normal.   ____________________________________________    EKG  EKG viewed and interpreted by myself shows a normal sinus rhythm at 94 bpm with a narrow QRS, normal axis, normal intervals, nonspecific ST changes.  ____________________________________________   INITIAL IMPRESSION / ASSESSMENT AND PLAN / ED COURSE  Pertinent labs & imaging results that were available during my care of the patient were reviewed by me and considered in my medical decision making (see chart for details).   Patient presents to the emergency department for generalized weakness, pain in his feet, concerned about how much fluid he has lost/weight loss and also several days of hematuria/dark urine.  Overall the patient appears well, mild/minimal lower extremity edema.  Patient states this is significantly improved from his admission 1 month ago.   Patient's lab work does show mild renal insufficiency however largely unchanged from the patient's baseline.  Troponin is mildly elevated at 46 however again largely unchanged from the patient's baseline.  CBC is largely within normal limits.  EKG shows no significant findings.  We will check a urine sample/urinalysis.  Patient states he hopes he does not have to be here much longer and is ready to go home.  Patient's repeat troponin remains unchanged.  Patient appears well.  Given the patient's reassuring work-up I believe the patient is safe for discharge home with PCP follow up.  We will ensure the patient is able to ambulate prior to discharge.  Urinalysis is normal.  Tyler Ochoa was evaluated in Emergency Department on 03/07/2021 for the symptoms described in the history of present illness. He was evaluated in the context of the global COVID-19 pandemic, which necessitated consideration that the patient might be at risk for infection with the SARS-CoV-2 virus that causes COVID-19. Institutional  protocols and algorithms that pertain to the evaluation of patients at risk for COVID-19 are in a state of rapid change based on information released by regulatory bodies including the CDC and federal and state organizations. These policies and algorithms were followed during the patient's care in the ED.  ____________________________________________   FINAL CLINICAL IMPRESSION(S) / ED DIAGNOSES  Weakness Dark urine   Minna Antis, MD 03/07/21 1935

## 2021-03-07 NOTE — ED Triage Notes (Signed)
Pt to ED because a doctor told him to "come get some labs". Pt cannot say why he needs labs. Pt states he has "a bad heart" but denies cardiac hx.  Pt states he feels fine other than "a little dizziness".  Pt in NAD.  Denies CP, SHOB

## 2021-03-09 NOTE — Progress Notes (Signed)
Patient ID: Tyler Ochoa, male    DOB: [birth year], 70 y.o.   MRN: [id number]  HPI  Tyler Ochoa is a 70 y/o male with a history of hyperlipidemia, HTN, CKD and chronic heart failure.   Echo report from 01/07/21 reviewed and showed an EF of 25-30% along with severe LVH and mild Tyler.   Was in the ED 03/07/21 due to weakness along with dark urine. Evaluated and released. Admitted 02/02/21 due to shortness of breath, edema and weight gain. Cardiology consult obtained. Was drinking ~ 1 gallon of water/ day. Given IV dopamine and IV lasix. Transitioned to oral medications. Elevated troponin thought to be due to demand ischemia. Discharged after 7 days.   He presents today for his initial visit with a chief complaint of minimal fatigue upon moderate exertion. He says that this has been present for several months. He has associated knee pain, light-headedness (after taking medications), difficulty sleeping (sleeps more during the day) and weight loss along with this. He denies any abdominal distention, pedal edema, chest pain, palpitations, shortness of breath, cough or weight gain.   Says that he's hoping to have knee surgery in the future as he has "bone on bone". Is only drinking ~ 30-32 ounces of fluid daily because he says he was told that he was drinking too much fluids.   Has not seen Dr. Welton Flakes since recent admission.   He says that back in January of this year, he weighed 320+ pounds due to all the fluid retained.   Past Medical History:  Diagnosis Date  . CHF (congestive heart failure) (HCC)   . Chronic kidney disease   . Hyperlipidemia   . Hypertension   . Peripheral edema     Social History   Tobacco Use  . Smoking status: Never Smoker  . Smokeless tobacco: Never Used  Substance Use Topics  . Alcohol use: Not on file   No Known Allergies   Prior to Admission medications   Medication Sig Start Date End Date Taking? Authorizing Provider  acetaminophen (TYLENOL) 500 MG tablet Take  500 mg by mouth every 6 (six) hours as needed.   Yes [provider]  aspirin EC 81 MG tablet Take 81 mg by mouth daily. Swallow whole.   Yes [provider]  atorvastatin (LIPITOR) 80 MG tablet Take 80 mg by mouth at bedtime. 12/08/20  Yes [provider]  dapagliflozin propanediol (FARXIGA) 10 MG TABS tablet Take 1 tablet (10 mg total) by mouth daily. 02/10/21 03/12/21 Yes Charise Killian, MD  furosemide (LASIX) 40 MG tablet Take 1 tablet (40 mg total) by mouth 2 (two) times daily. 02/09/21 03/11/21 Yes Charise Killian, MD  metoprolol succinate (TOPROL XL) 50 MG 24 hr tablet Take 1 tablet (50 mg total) by mouth daily. Take with or immediately following a meal. 03/07/21 03/07/22 Yes Paduchowski, Caryn Bee, MD  potassium chloride SA (KLOR-CON) 20 MEQ tablet Take 20 mEq by mouth daily. 01/26/21  Yes [provider]  PROAIR HFA 108 (90 Base) MCG/ACT inhaler Inhale 2 puff using inhaler every four to six hours as needed for wheezing, cough, shortness of breath 12/09/20  Yes [provider]  sacubitril-valsartan (ENTRESTO) 24-26 MG Take 1 tablet by mouth 2 (two) times daily. 02/09/21 03/11/21 Yes Charise Killian, MD    Review of Systems  Constitutional: Positive for fatigue. Negative for appetite change.  HENT: Negative for congestion, postnasal drip and sore throat.   Eyes: Negative.   Respiratory: Negative  for cough and shortness of breath.   Cardiovascular: Negative for chest pain, palpitations and leg swelling.  Gastrointestinal: Negative for abdominal distention and abdominal pain.  Endocrine: Negative.   Genitourinary: Negative.   Musculoskeletal: Positive for arthralgias (under feet/ knee pain). Negative for back pain.  Skin: Negative.   Allergic/Immunologic: Negative.   Neurological: Positive for light-headedness (after taking medications). Negative for dizziness.  Hematological: Negative for adenopathy. Does not bruise/bleed easily.   Psychiatric/Behavioral: Positive for sleep disturbance (sleeping more during the day). Negative for dysphoric mood. The patient is not nervous/anxious.     Vitals:   03/10/21 1111  BP: 112/81  Pulse: 93  Resp: 18  SpO2: 99%  Weight: 245 lb 2 oz (111.2 kg)  Height: 6\' 3"  (1.905 m)   Wt Readings from Last 3 Encounters:  03/10/21 245 lb 2 oz (111.2 kg)  03/07/21 238 lb 1.6 oz (108 kg)  02/09/21 284 lb 4.8 oz (129 kg)   Lab Results  Component Value Date   CREATININE 1.63 (H) 03/07/2021   CREATININE 1.50 (H) 02/09/2021   CREATININE 1.35 (H) 02/08/2021    Physical Exam Vitals and nursing note reviewed. Exam conducted with a chaperone present (significant other).  Constitutional:      Appearance: Normal appearance.  HENT:     Head: Normocephalic and atraumatic.  Cardiovascular:     Rate and Rhythm: Normal rate and regular rhythm.  Pulmonary:     Effort: Pulmonary effort is normal. No respiratory distress.     Breath sounds: No wheezing or rales.  Abdominal:     General: Abdomen is flat. There is no distension.     Palpations: Abdomen is soft.     Tenderness: There is no abdominal tenderness.  Musculoskeletal:        General: No tenderness.     Cervical back: Normal range of motion and neck supple.     Right lower leg: No edema.     Left lower leg: No edema.  Skin:    General: Skin is warm and dry.  Neurological:     General: No focal deficit present.     Mental Status: He is alert and oriented to person, place, and time.  Psychiatric:        Mood and Affect: Mood normal.        Behavior: Behavior normal.        Thought Content: Thought content normal.     Assessment & Plan:  1: Chronic heart failure with reduced ejection fraction- - NYHA class II - euvolemic today - weighing daily and he says that he's continued to lose weight since his recent admission. Admission weight was 317 pounds and today's weight is 238.2 in the office (loss of 78.8 pounds in 5 weeks) -  reminded to call for an overnight weight gain of > 2 pounds or a weekly weight gain of > 5 pounds - will decrease his furosemide to 40mg  daily (was BID) - 1 month samples provided for entresto and farxiga; patient assistance applications for both of these medications filled out today - only drinking ~ 30-32 ounces of fluid daily; advised that he could drink up to 60 ounces - tried to call cardiology ([facility]) office to schedule f/u appointment but unable to reach them; his office # was provided on patient's AVS - on GDMT of dapagliflozin, metoprolol succinate, entresto; should BP rise some, consider adding spironolactone and then titrating up medications - BNP 02/05/21 was 1011.5 - PharmD reconciled medications with patient and  significant other  2: HTN- - BP looks good today although on the low side (112/81) - has seen PCP at [facility] in the past - BMP 03/07/21 reviewed and showed sodium 138, potassium 3.5, creatinine 1.63 and GFR 46 (slight decline from recent admission) - recheck BMP today   Medication bottles reviewed.   Return in 1 month or sooner for any questions/problems before then.

## 2021-03-10 ENCOUNTER — Encounter: Payer: Self-pay | Admitting: Family

## 2021-03-10 ENCOUNTER — Other Ambulatory Visit: Payer: Self-pay

## 2021-03-10 ENCOUNTER — Telehealth: Payer: Self-pay | Admitting: Family

## 2021-03-10 ENCOUNTER — Ambulatory Visit: Payer: Medicare HMO | Attending: Family | Admitting: Family

## 2021-03-10 VITALS — BP 112/81 | HR 93 | Resp 18 | Ht 75.0 in | Wt 245.1 lb

## 2021-03-10 DIAGNOSIS — Z6830 Body mass index (BMI) 30.0-30.9, adult: Secondary | ICD-10-CM | POA: Insufficient documentation

## 2021-03-10 DIAGNOSIS — M25569 Pain in unspecified knee: Secondary | ICD-10-CM | POA: Insufficient documentation

## 2021-03-10 DIAGNOSIS — R42 Dizziness and giddiness: Secondary | ICD-10-CM | POA: Diagnosis not present

## 2021-03-10 DIAGNOSIS — R634 Abnormal weight loss: Secondary | ICD-10-CM | POA: Insufficient documentation

## 2021-03-10 DIAGNOSIS — Z5181 Encounter for therapeutic drug level monitoring: Secondary | ICD-10-CM | POA: Insufficient documentation

## 2021-03-10 DIAGNOSIS — Z7984 Long term (current) use of oral hypoglycemic drugs: Secondary | ICD-10-CM | POA: Insufficient documentation

## 2021-03-10 DIAGNOSIS — Z79899 Other long term (current) drug therapy: Secondary | ICD-10-CM | POA: Insufficient documentation

## 2021-03-10 DIAGNOSIS — E785 Hyperlipidemia, unspecified: Secondary | ICD-10-CM | POA: Diagnosis not present

## 2021-03-10 DIAGNOSIS — N189 Chronic kidney disease, unspecified: Secondary | ICD-10-CM | POA: Insufficient documentation

## 2021-03-10 DIAGNOSIS — I130 Hypertensive heart and chronic kidney disease with heart failure and stage 1 through stage 4 chronic kidney disease, or unspecified chronic kidney disease: Secondary | ICD-10-CM | POA: Diagnosis present

## 2021-03-10 DIAGNOSIS — R5383 Other fatigue: Secondary | ICD-10-CM | POA: Insufficient documentation

## 2021-03-10 DIAGNOSIS — I10 Essential (primary) hypertension: Secondary | ICD-10-CM

## 2021-03-10 DIAGNOSIS — Z7982 Long term (current) use of aspirin: Secondary | ICD-10-CM | POA: Diagnosis not present

## 2021-03-10 DIAGNOSIS — I5022 Chronic systolic (congestive) heart failure: Secondary | ICD-10-CM | POA: Diagnosis not present

## 2021-03-10 LAB — BASIC METABOLIC PANEL
Anion gap: 12 (ref 5–15)
BUN: 24 mg/dL — ABNORMAL HIGH (ref 8–23)
CO2: 25 mmol/L (ref 22–32)
Calcium: 9 mg/dL (ref 8.9–10.3)
Chloride: 100 mmol/L (ref 98–111)
Creatinine, Ser: 1.38 mg/dL — ABNORMAL HIGH (ref 0.61–1.24)
GFR, Estimated: 56 mL/min — ABNORMAL LOW (ref >60)
Glucose, Bld: 110 mg/dL — ABNORMAL HIGH (ref 70–99)
Potassium: 3 mmol/L — ABNORMAL LOW (ref 3.5–5.1)
Sodium: 137 mmol/L (ref 135–145)

## 2021-03-10 MED ORDER — METOPROLOL SUCCINATE ER 50 MG PO TB24: 50.0000 mg | ORAL_TABLET | Freq: Every day | ORAL | 5 refills | Status: DC

## 2021-03-10 MED ORDER — FUROSEMIDE 40 MG PO TABS: 40.0000 mg | ORAL_TABLET | Freq: Every day | ORAL | 5 refills | Status: DC

## 2021-03-10 NOTE — Patient Instructions (Addendum)
Continue weighing daily and call for an overnight weight gain of > 2 pounds or a weekly weight gain of >5 pounds.   Drink 60 ounces of fluids daily.   Decrease furosemide (lasix) to once a day   Dr. Adrian Blackwater, MD( Cardiology) [address], [place], [place] [address] [phone]

## 2021-03-10 NOTE — Telephone Encounter (Signed)
Spoke with significant other, Delorise Shiner, regarding patient's BMP results obtained earlier today. Renal function looks better from ED visit 3 days ago. Potassium level is low at 3.0  Earlier today I had patient decrease his furosemide to once a day (was twice daily). Trudi Ida that for tomorrow, Thursday & Friday, have the patient take 2 potassium pills daily and then on Saturday, he can resume taking 1 tablet daily.   Delorise Shiner verbalized understanding and repeated back the instructions. Will recheck his labs at next visit if not done elsewhere prior.

## 2021-03-10 NOTE — Progress Notes (Signed)
[place] - PHARMACIST COUNSELING NOTE  ADHERENCE ASSESSMENT  Adherence strategy: stores medications together    Do you ever forget to take your medication? [] Yes (1) [x] No (0)  Do you ever skip doses due to side effects? [] Yes (1) [x] No (0)  Do you have trouble affording your medicines? [x] Yes (1) [] No (0)  Are you ever unable to pick up your medication due to transportation difficulties? [] Yes (1) [x] No (0)  Do you ever stop taking your medications because you don't believe they are helping? [] Yes (1) [x] No (0)  Total score _1______    Recommendations given to patient about increasing adherence: pt was given a pill box in clinic. Pt reports Entresto and Wilder Glade are expensive - samples provided and patient assistance forms completed. Informed pt to call clinic if begins to run low on medications before patient assistance program sends refills.   Guideline-Directed Medical Therapy/Evidence Based Medicine  ACE/ARB/ARNI: [employer] Corporal Blocker: metoprolol succinate Aldosterone Antagonist: N/A Diuretic: furosemide    SUBJECTIVE  HPI:  Past Medical History:  Diagnosis Date  . CHF (congestive heart failure) ([place])   . Hyperlipidemia   . Hypertension   . Peripheral edema         OBJECTIVE   Vital signs: HR 112/81, BP 93, weight 111.2 kg ECHO: Date 01/07/21, EF 25-30%  BMP Latest Ref Rng & Units 03/07/2021 02/09/2021 02/08/2021  Glucose 70 - 99 mg/dL 106(H) 90 103(H)  BUN 8 - 23 mg/dL 31(H) 12 11  Creatinine 0.61 - 1.24 mg/dL 1.63(H) 1.50(H) 1.35(H)  Sodium 135 - 145 mmol/L 138 135 133(L)  Potassium 3.5 - 5.1 mmol/L 3.5 4.1 3.9  Chloride 98 - 111 mmol/L 100 102 99  CO2 22 - 32 mmol/L 23 24 24   Calcium 8.9 - 10.3 mg/dL 9.4 9.1 9.3    ASSESSMENT 70 yo M presenting to heart failure clinic for new pt visit. PMH includes HTN, HLD, diabetes, CKD, and CHF. Pt was given a pill box in clinic to help with medication adherence. Pt reports  Entresto and Wilder Glade are expensive. No additional barriers to adherence identified during medication reconciliation.  PLAN CHF/HTN - Continue Entresto 24/26 mg twice daily and dapagliflozin 10 mg daily - pt reports these are expensive (~$150 per month) samples provided and patient assistance forms completed. Informed pt to call clinic if begins to run low on medications before patient assistance program sends refills. - Decrease furosemide to 40 mg daily  - Continue KCl 20 mEq daily depending on lab results. May need to decrease KCl dose with furosemide dose.  - Continue metoprolol succinate 50 mg daily   Diabetes/HLD - Continue dapagliflozin 10 mg daily - Continue atorvastatin 80 mg daily   Shortness of Breath - Continue albuterol inhaler use as needed  Pain/arthritis - Continue acetaminophen as needed  Time spent: 15 minutes  Benn Moulder, PharmD Pharmacy Resident  03/10/2021 11:47 AM    Current Outpatient Medications:  .  acetaminophen (TYLENOL) 500 MG tablet, Take 500 mg by mouth every 6 (six) hours as needed., Disp: , Rfl:  .  aspirin EC 81 MG tablet, Take 81 mg by mouth daily. Swallow whole., Disp: , Rfl:  .  atorvastatin (LIPITOR) 80 MG tablet, Take 80 mg by mouth at bedtime., Disp: , Rfl:  .  dapagliflozin propanediol (FARXIGA) 10 MG TABS tablet, Take 1 tablet (10 mg total) by mouth daily., Disp: 30 tablet, Rfl: 0 .  furosemide (LASIX) 40 MG tablet, Take 1 tablet (  40 mg total) by mouth 2 (two) times daily., Disp: 60 tablet, Rfl: 0 .  metoprolol succinate (TOPROL XL) 50 MG 24 hr tablet, Take 1 tablet (50 mg total) by mouth daily. Take with or immediately following a meal., Disp: 30 tablet, Rfl: 2 .  potassium chloride SA (KLOR-CON) 20 MEQ tablet, Take 20 mEq by mouth daily., Disp: , Rfl:  .  PROAIR HFA 108 (90 Base) MCG/ACT inhaler, Inhale 2 puff using inhaler every four to six hours as needed for wheezing, cough, shortness of breath (Patient not taking: No sig  reported), Disp: , Rfl:  .  sacubitril-valsartan (ENTRESTO) 24-26 MG, Take 1 tablet by mouth 2 (two) times daily., Disp: 60 tablet, Rfl: 0   COUNSELING POINTS/CLINICAL PEARLS  Metoprolol Succinate (Goal: 200 mg once daily) Warn patient to avoid activities requiring mental alertness or coordination until drug effects are realized, as drug may cause dizziness. Tell patient planning major surgery with anesthesia to alert physician that drug is being used, as drug impairs ability of heart to respond to reflex adrenergic stimuli. Drug may cause diarrhea, fatigue, headache, or depression. Advise diabetic patient to carefully monitor blood glucose as drug may mask symptoms of hypoglycemia. Patient should take extended-release tablet with or immediately following meals. Counsel patient against sudden discontinuation of drug, as this may precipitate hypertension, angina, or myocardial infarction. In the event of a missed dose, counsel patient to skip the missed dose and maintain a regular dosing schedule. Entresto (Goal: 97/103 mg twice daily)  Warn male patient to avoid pregnancy during therapy and to report a pregnancy to a physician.  Advise patient to report symptomatic hypotension.  Side effects may include hyperkalemia, cough, dizziness, or renal failure. Furosemide  Drug causes sun-sensitivity. Advise patient to use sunscreen and avoid tanning beds. Patient should avoid activities requiring coordination until drug effects are realized, as drug may cause dizziness, vertigo, or blurred vision. This drug may cause hyperglycemia, hyperuricemia, constipation, diarrhea, loss of appetite, nausea, vomiting, purpuric disorder, cramps, spasticity, asthenia, headache, paresthesia, or scaling eczema. Instruct patient to report unusual bleeding/bruising or signs/symptoms of hypotension, infection, pancreatitis, or ototoxicity (tinnitus, hearing impairment). Advise patient to report signs/symptoms of a  severe skin reactions (flu-like symptoms, spreading red rash, or skin/mucous membrane blistering) or erythema multiforme. Instruct patient to eat high-potassium foods during drug therapy, as directed by healthcare professional.  Patient should not drink alcohol while taking this drug.  DRUGS TO AVOID IN HEART FAILURE  Drug or Class Mechanism  Analgesics . NSAIDs . COX-2 inhibitors . Glucocorticoids  Sodium and water retention, increased systemic vascular resistance, decreased response to diuretics   Diabetes Medications . Metformin . Thiazolidinediones o Rosiglitazone (Avandia) o Pioglitazone (Actos) . DPP4 Inhibitors o Saxagliptin (Onglyza) o Sitagliptin (Januvia)   Lactic acidosis Possible calcium channel blockade   Unknown  Antiarrhythmics . Class I  o Flecainide o Disopyramide . Class III o Sotalol . Other o Dronedarone  Negative inotrope, proarrhythmic   Proarrhythmic, beta blockade  Negative inotrope  Antihypertensives . Alpha Blockers o Doxazosin . Calcium Channel Blockers o Diltiazem o Verapamil o Nifedipine . Central Alpha Adrenergics o Moxonidine . Peripheral Vasodilators o Minoxidil  Increases renin and aldosterone  Negative inotrope    Possible sympathetic withdrawal  Unknown  Anti-infective . Itraconazole . Amphotericin B  Negative inotrope Unknown  Hematologic . Anagrelide . Cilostazol   Possible inhibition of PD IV Inhibition of PD III causing arrhythmias  Neurologic/Psychiatric . Stimulants . Anti-Seizure Drugs o Carbamazepine o Pregabalin . Antidepressants  o Tricyclics o Citalopram . Parkinsons o Bromocriptine o Pergolide o Pramipexole . Antipsychotics o Clozapine . Antimigraine o Ergotamine o Methysergide . Appetite suppressants . Bipolar o Lithium  Peripheral alpha and beta agonist activity  Negative inotrope and chronotrope Calcium channel blockade  Negative inotrope, proarrhythmic Dose-dependent QT  prolongation  Excessive serotonin activity/valvular damage Excessive serotonin activity/valvular damage Unknown  IgE mediated hypersensitivy, calcium channel blockade  Excessive serotonin activity/valvular damage Excessive serotonin activity/valvular damage Valvular damage  Direct myofibrillar degeneration, adrenergic stimulation  Antimalarials . Chloroquine . Hydroxychloroquine Intracellular inhibition of lysosomal enzymes  Urologic Agents . Alpha Blockers o Doxazosin o Prazosin o Tamsulosin o Terazosin  Increased renin and aldosterone  Adapted from Page RL, et al. "Drugs That May Cause or Exacerbate Heart Failure: A Scientific Statement from the [employer]." Circulation 2016; 034:V42-V95. DOI: 10.1161/CIR.0000000000000426   MEDICATION ADHERENCES TIPS AND STRATEGIES 1. Taking medication as prescribed improves patient outcomes in heart failure (reduces hospitalizations, improves symptoms, increases survival) 2. Side effects of medications can be managed by decreasing doses, switching agents, stopping drugs, or adding additional therapy. Please let someone in the [facility] know if you have having bothersome side effects so we can modify your regimen. Do not alter your medication regimen without talking to [place].  3. Medication reminders can help patients remember to take drugs on time. If you are missing or forgetting doses you can try linking behaviors, using pill boxes, or an electronic reminder like an alarm on your phone or an app. Some people can also get automated phone calls as medication reminders.

## 2021-03-13 ENCOUNTER — Telehealth: Payer: Self-pay | Admitting: Family

## 2021-03-13 NOTE — Telephone Encounter (Signed)
Called in attempt to notify patient that he was approved for patient assistance till the end of the year and for patient to call [employer] and set up mail order delivery but was unable to reach.  Fuad Forget, NT

## 2021-04-12 NOTE — Progress Notes (Deleted)
Patient ID: Tyler Ochoa, male    DOB: 1951/10/28, 70 y.o.   MRN: [id number]  HPI  Tyler Ochoa is a 70 y/o male with a history of hyperlipidemia, HTN, CKD and chronic heart failure.   Echo report from 01/07/21 reviewed and showed an EF of 25-30% along with severe LVH and mild Tyler.   Was in the ED 03/07/21 due to weakness along with dark urine. Evaluated and released. Admitted 02/02/21 due to shortness of breath, edema and weight gain. Cardiology consult obtained. Was drinking ~ 1 gallon of water/ day. Given IV dopamine and IV lasix. Transitioned to oral medications. Elevated troponin thought to be due to demand ischemia. Discharged after 7 days.   He presents today for a follow-up visit with a chief complaint of    Past Medical History:  Diagnosis Date  . CHF (congestive heart failure) ([facility])   . Chronic kidney disease   . Hyperlipidemia   . Hypertension   . Peripheral edema     Social History   Tobacco Use  . Smoking status: Never Smoker  . Smokeless tobacco: Never Used  Substance Use Topics  . Alcohol use: Not on file   No Known Allergies     Review of Systems  Constitutional: Positive for fatigue. Negative for appetite change.  HENT: Negative for congestion, postnasal drip and sore throat.   Eyes: Negative.   Respiratory: Negative for cough and shortness of breath.   Cardiovascular: Negative for chest pain, palpitations and leg swelling.  Gastrointestinal: Negative for abdominal distention and abdominal pain.  Endocrine: Negative.   Genitourinary: Negative.   Musculoskeletal: Positive for arthralgias (under feet/ knee pain). Negative for back pain.  Skin: Negative.   Allergic/Immunologic: Negative.   Neurological: Positive for light-headedness (after taking medications). Negative for dizziness.  Hematological: Negative for adenopathy. Does not bruise/bleed easily.  Psychiatric/Behavioral: Positive for sleep disturbance (sleeping more during the day). Negative for  dysphoric mood. The patient is not nervous/anxious.       Physical Exam Vitals and nursing note reviewed. Exam conducted with a chaperone present (significant other).  Constitutional:      Appearance: Normal appearance.  HENT:     Head: Normocephalic and atraumatic.  Cardiovascular:     Rate and Rhythm: Normal rate and regular rhythm.  Pulmonary:     Effort: Pulmonary effort is normal. No respiratory distress.     Breath sounds: No wheezing or rales.  Abdominal:     General: Abdomen is flat. There is no distension.     Palpations: Abdomen is soft.     Tenderness: There is no abdominal tenderness.  Musculoskeletal:        General: No tenderness.     Cervical back: Normal range of motion and neck supple.     Right lower leg: No edema.     Left lower leg: No edema.  Skin:    General: Skin is warm and dry.  Neurological:     General: No focal deficit present.     Mental Status: He is alert and oriented to person, place, and time.  Psychiatric:        Mood and Affect: Mood normal.        Behavior: Behavior normal.        Thought Content: Thought content normal.     Assessment & Plan:  1: Chronic heart failure with reduced ejection fraction- - NYHA class II - euvolemic today - weighing daily and he says that he's continued to lose  weight since his recent admission. Admission weight was 317 pounds and today's weight is 238.2 in the office (loss of 78.8 pounds in 5 weeks) - reminded to call for an overnight weight gain of > 2 pounds or a weekly weight gain of > 5 pounds - weight 245.2 from last visit here 1 month ago - only drinking ~ 30-32 ounces of fluid daily; advised that he could drink up to 60 ounces - tried to call cardiology (Tyler Ochoa's) office to schedule f/u appointment but unable to reach them; his office # was provided on patient's AVS - on GDMT of dapagliflozin, metoprolol succinate, entresto; should BP rise some, consider adding spironolactone and then titrating up  medications - BNP 02/05/21 was 1011.5 - PharmD reconciled medications with patient and significant other  2: HTN- - BP - has seen PCP at [facility] in the past - BMP 03/10/21 reviewed and showed sodium 137, potassium 3.0, creatinine 1.38 and GFR 56 - recheck BMP today   Medication bottles reviewed.

## 2021-04-13 ENCOUNTER — Other Ambulatory Visit: Payer: Self-pay | Admitting: Family

## 2021-04-13 ENCOUNTER — Ambulatory Visit: Payer: Medicare HMO | Admitting: Family

## 2021-04-13 MED ORDER — ATORVASTATIN CALCIUM 80 MG PO TABS: 80.0000 mg | ORAL_TABLET | Freq: Every day | ORAL | 5 refills | Status: DC

## 2021-04-13 MED ORDER — FUROSEMIDE 40 MG PO TABS: 40.0000 mg | ORAL_TABLET | Freq: Every day | ORAL | 5 refills | Status: DC

## 2021-04-13 MED ORDER — ASPIRIN EC 81 MG PO TBEC: 81.0000 mg | DELAYED_RELEASE_TABLET | Freq: Every day | ORAL | 5 refills | Status: DC

## 2021-04-13 MED ORDER — POTASSIUM CHLORIDE CRYS ER 20 MEQ PO TBCR: 20.0000 meq | EXTENDED_RELEASE_TABLET | Freq: Every day | ORAL | 5 refills | Status: DC

## 2021-04-13 MED ORDER — METOPROLOL SUCCINATE ER 50 MG PO TB24: 50.0000 mg | ORAL_TABLET | Freq: Every day | ORAL | 5 refills | Status: DC

## 2021-04-14 ENCOUNTER — Telehealth: Payer: Self-pay | Admitting: Family

## 2021-04-14 ENCOUNTER — Other Ambulatory Visit: Payer: Self-pay | Admitting: Family

## 2021-04-14 MED ORDER — DAPAGLIFLOZIN PROPANEDIOL 10 MG PO TABS: 10.0000 mg | ORAL_TABLET | Freq: Every day | ORAL | 3 refills | Status: DC

## 2021-04-14 MED ORDER — SACUBITRIL-VALSARTAN 24-26 MG PO TABS: 1.0000 | ORAL_TABLET | Freq: Two times a day (BID) | ORAL | 3 refills | Status: DC

## 2021-04-14 NOTE — Telephone Encounter (Signed)
Spoke with patient and his wife and gave detailed instructions on setting up mail order delivery for entresto after previously leaving message with instructions. Patients application for Farxiga with [employer] is still currently pending but told patient we will let them know as soon as I hear something. Until then, Patient is coming by office for samples on both today.   Kaeli Nichelson, NT

## 2021-05-01 ENCOUNTER — Other Ambulatory Visit: Payer: Self-pay | Admitting: Cardiovascular Disease

## 2021-05-01 MED ORDER — DAPAGLIFLOZIN PROPANEDIOL 10 MG PO TABS: 10.0000 mg | ORAL_TABLET | Freq: Every day | ORAL | 3 refills | Status: DC

## 2021-05-05 ENCOUNTER — Other Ambulatory Visit: Payer: Self-pay

## 2021-05-05 ENCOUNTER — Encounter: Payer: Self-pay | Admitting: Family

## 2021-05-05 ENCOUNTER — Ambulatory Visit: Payer: Medicare HMO | Attending: Family | Admitting: Family

## 2021-05-05 VITALS — BP 104/67 | HR 80 | Resp 14 | Ht 74.0 in | Wt 217.5 lb

## 2021-05-05 DIAGNOSIS — Z7982 Long term (current) use of aspirin: Secondary | ICD-10-CM | POA: Insufficient documentation

## 2021-05-05 DIAGNOSIS — E785 Hyperlipidemia, unspecified: Secondary | ICD-10-CM | POA: Insufficient documentation

## 2021-05-05 DIAGNOSIS — I5022 Chronic systolic (congestive) heart failure: Secondary | ICD-10-CM | POA: Diagnosis present

## 2021-05-05 DIAGNOSIS — Z7984 Long term (current) use of oral hypoglycemic drugs: Secondary | ICD-10-CM | POA: Insufficient documentation

## 2021-05-05 DIAGNOSIS — N189 Chronic kidney disease, unspecified: Secondary | ICD-10-CM | POA: Insufficient documentation

## 2021-05-05 DIAGNOSIS — I130 Hypertensive heart and chronic kidney disease with heart failure and stage 1 through stage 4 chronic kidney disease, or unspecified chronic kidney disease: Secondary | ICD-10-CM | POA: Diagnosis present

## 2021-05-05 DIAGNOSIS — I10 Essential (primary) hypertension: Secondary | ICD-10-CM

## 2021-05-05 LAB — BASIC METABOLIC PANEL
Anion gap: 9 (ref 5–15)
BUN: 20 mg/dL (ref 8–23)
CO2: 26 mmol/L (ref 22–32)
Calcium: 9.6 mg/dL (ref 8.9–10.3)
Chloride: 101 mmol/L (ref 98–111)
Creatinine, Ser: 1.1 mg/dL (ref 0.61–1.24)
GFR, Estimated: >60 mL/min (ref >60)
Glucose, Bld: 90 mg/dL (ref 70–99)
Potassium: 4.1 mmol/L (ref 3.5–5.1)
Sodium: 136 mmol/L (ref 135–145)

## 2021-05-05 NOTE — Progress Notes (Signed)
Patient ID: Tyler Ochoa, male    DOB: July 09, 1951, 70 y.o.   MRN: [id number]  HPI  Tyler Ochoa is a 70 y/o male with a history of hyperlipidemia, HTN, CKD and chronic heart failure.   Echo report from 01/07/21 reviewed and showed an EF of 25-30% along with severe LVH and mild Tyler.   Was in the ED 03/07/21 due to weakness along with dark urine. Evaluated and released. Admitted 02/02/21 due to shortness of breath, edema and weight gain. Cardiology consult obtained. Was drinking ~ 1 gallon of water/ day. Given IV dopamine and IV lasix. Transitioned to oral medications. Elevated troponin thought to be due to demand ischemia. Discharged after 7 days.   He presents today for a follow-up visit with a chief complaint of knee pain. He says that this has been present for several months. He says that he feels like it's related to his weight loss and muscle loss. He has  Associated decreased appetite along with this. He denies any fatigue, cough, shortness of breath, chest pain, pedal edema, palpitations, abdominal distention, difficulty sleeping, dizziness or weight gain.   Is now drinking ensure every couple of days. Feels like his weight loss has stabilized in the last week.   Past Medical History:  Diagnosis Date   CHF (congestive heart failure) (H[facility])    Chronic kidney disease    Hyperlipidemia    Hypertension    Peripheral edema     Social History   Tobacco Use   Smoking status: Never   Smokeless tobacco: Never  Substance Use Topics   Alcohol use: Not on file   No Known Allergies   Prior to Admission medications   Medication Sig Start Date End Date Taking? Authorizing Provider  acetaminophen (TYLENOL) 500 MG tablet Take 500 mg by mouth every 6 (six) hours as needed.    [provider]  aspirin EC 81 MG tablet Take 1 tablet (81 mg total) by mouth daily. Swallow whole. 04/13/21   Delma Freeze, FNP  atorvastatin (LIPITOR) 80 MG tablet Take 1 tablet (80 mg total) by mouth at  bedtime. 04/13/21   Delma Freeze, FNP  dapagliflozin propanediol (FARXIGA) 10 MG TABS tablet Take 1 tablet (10 mg total) by mouth daily before breakfast. 05/01/21   Gollan, Tollie Pizza, MD  furosemide (LASIX) 40 MG tablet Take 1 tablet (40 mg total) by mouth daily. 04/13/21 05/13/21  Delma Freeze, FNP  metoprolol succinate (TOPROL XL) 50 MG 24 hr tablet Take 1 tablet (50 mg total) by mouth daily. Take with or immediately following a meal. 04/13/21 04/13/22  Clarisa Kindred A, FNP  potassium chloride SA (KLOR-CON) 20 MEQ tablet Take 1 tablet (20 mEq total) by mouth daily. 04/13/21   Delma Freeze, FNP  PROAIR HFA 108 [phone] Base) MCG/ACT inhaler Inhale 2 puff using inhaler every four to six hours as needed for wheezing, cough, shortness of breath 12/09/20   [provider]  sacubitril-valsartan (ENTRESTO) 24-26 MG Take 1 tablet by mouth 2 (two) times daily. 04/14/21   Delma Freeze, FNP   Review of Systems  Constitutional:  Positive for appetite change (decreased). Negative for fatigue.  HENT:  Negative for congestion and postnasal drip.   Eyes: Negative.   Respiratory:  Negative for cough, chest tightness and shortness of breath.   Cardiovascular:  Negative for chest pain, palpitations and leg swelling.  Gastrointestinal:  Negative for abdominal distention and abdominal pain.  Endocrine: Negative.   Genitourinary: Negative.  Musculoskeletal:  Positive for arthralgias (knee pain). Negative for back pain.  Skin: Negative.   Allergic/Immunologic: Negative.   Neurological:  Negative for dizziness and light-headedness.  Hematological:  Negative for adenopathy. Does not bruise/bleed easily.  Psychiatric/Behavioral:  Negative for dysphoric mood and sleep disturbance. The patient is not nervous/anxious.    Vitals:   05/05/21 1340  BP: 104/67  Pulse: 80  Resp: 14  SpO2: 100%  Weight: 217 lb 8 oz (98.7 kg)  Height: 6\' 2"  (1.88 m)   Wt Readings from Last 3 Encounters:  05/05/21 217 lb 8 oz (98.7  kg)  03/10/21 245 lb 2 oz (111.2 kg)  03/07/21 238 lb 1.6 oz (108 kg)   Lab Results  Component Value Date   CREATININE 1.10 05/05/2021   CREATININE 1.38 (H) 03/10/2021   CREATININE 1.63 (H) 03/07/2021    Physical Exam Vitals and nursing note reviewed. Exam conducted with a chaperone present (significant other).  Constitutional:      Appearance: Normal appearance.  HENT:     Head: Normocephalic and atraumatic.  Cardiovascular:     Rate and Rhythm: Normal rate and regular rhythm.  Pulmonary:     Effort: Pulmonary effort is normal. No respiratory distress.     Breath sounds: No wheezing or rales.  Abdominal:     General: There is no distension.     Tenderness: There is no abdominal tenderness.  Musculoskeletal:        General: No tenderness.     Cervical back: Normal range of motion and neck supple.     Right lower leg: No edema.     Left lower leg: No edema.  Skin:    General: Skin is warm and dry.  Neurological:     General: No focal deficit present.     Mental Status: He is alert and oriented to person, place, and time.  Psychiatric:        Mood and Affect: Mood normal.        Behavior: Behavior normal.        Thought Content: Thought content normal.     Assessment & Plan:  1: Chronic heart failure with reduced ejection fraction- - NYHA class I - euvolemic today - weighing daily; reminded to call for an overnight weight gain of > 2 pounds or a weekly weight gain of > 5 pounds - weight down 27 pounds from last visit here 1 month ago; has lost 79+ pounds in the last 3 months - will decrease furosemide to 20mg  daily; plan to make it PRN at next visit if able - drinking ensure every couple of days - on GDMT of dapagliflozin, metoprolol succinate, entresto - unable to titrate meds or add spironolactone due to BP - BNP 02/05/21 was 1011.5  2: HTN- - BP looks good although on the low side (104/67) - has seen PCP at [facility] in the past - BMP 03/10/21  reviewed and showed sodium 137, potassium 3.0, creatinine 1.38 and GFR 56 - recheck BMP today   Medication bottles reviewed.   Return in 1 month or sooner for any questions/problems before then.

## 2021-05-05 NOTE — Patient Instructions (Addendum)
Continue weighing daily and call for an overnight weight gain of > 2 pounds or a weekly weight gain of >5 pounds.    Decrease your furosemide to 1/2 tablet a day

## 2021-06-03 ENCOUNTER — Ambulatory Visit: Payer: Medicare HMO | Admitting: Family

## 2021-07-01 ENCOUNTER — Ambulatory Visit: Payer: Medicare HMO | Admitting: Family

## 2021-07-08 ENCOUNTER — Ambulatory Visit: Payer: Medicare HMO | Admitting: Family

## 2021-07-15 IMAGING — CR DG CHEST 2V
2 series · 2 of 2 positions shown · non-contrast
Comparison: December 11, 2020.

CLINICAL DATA: Cough, shortness of breath.

EXAM:
CHEST - 2 VIEW

[chest lat]
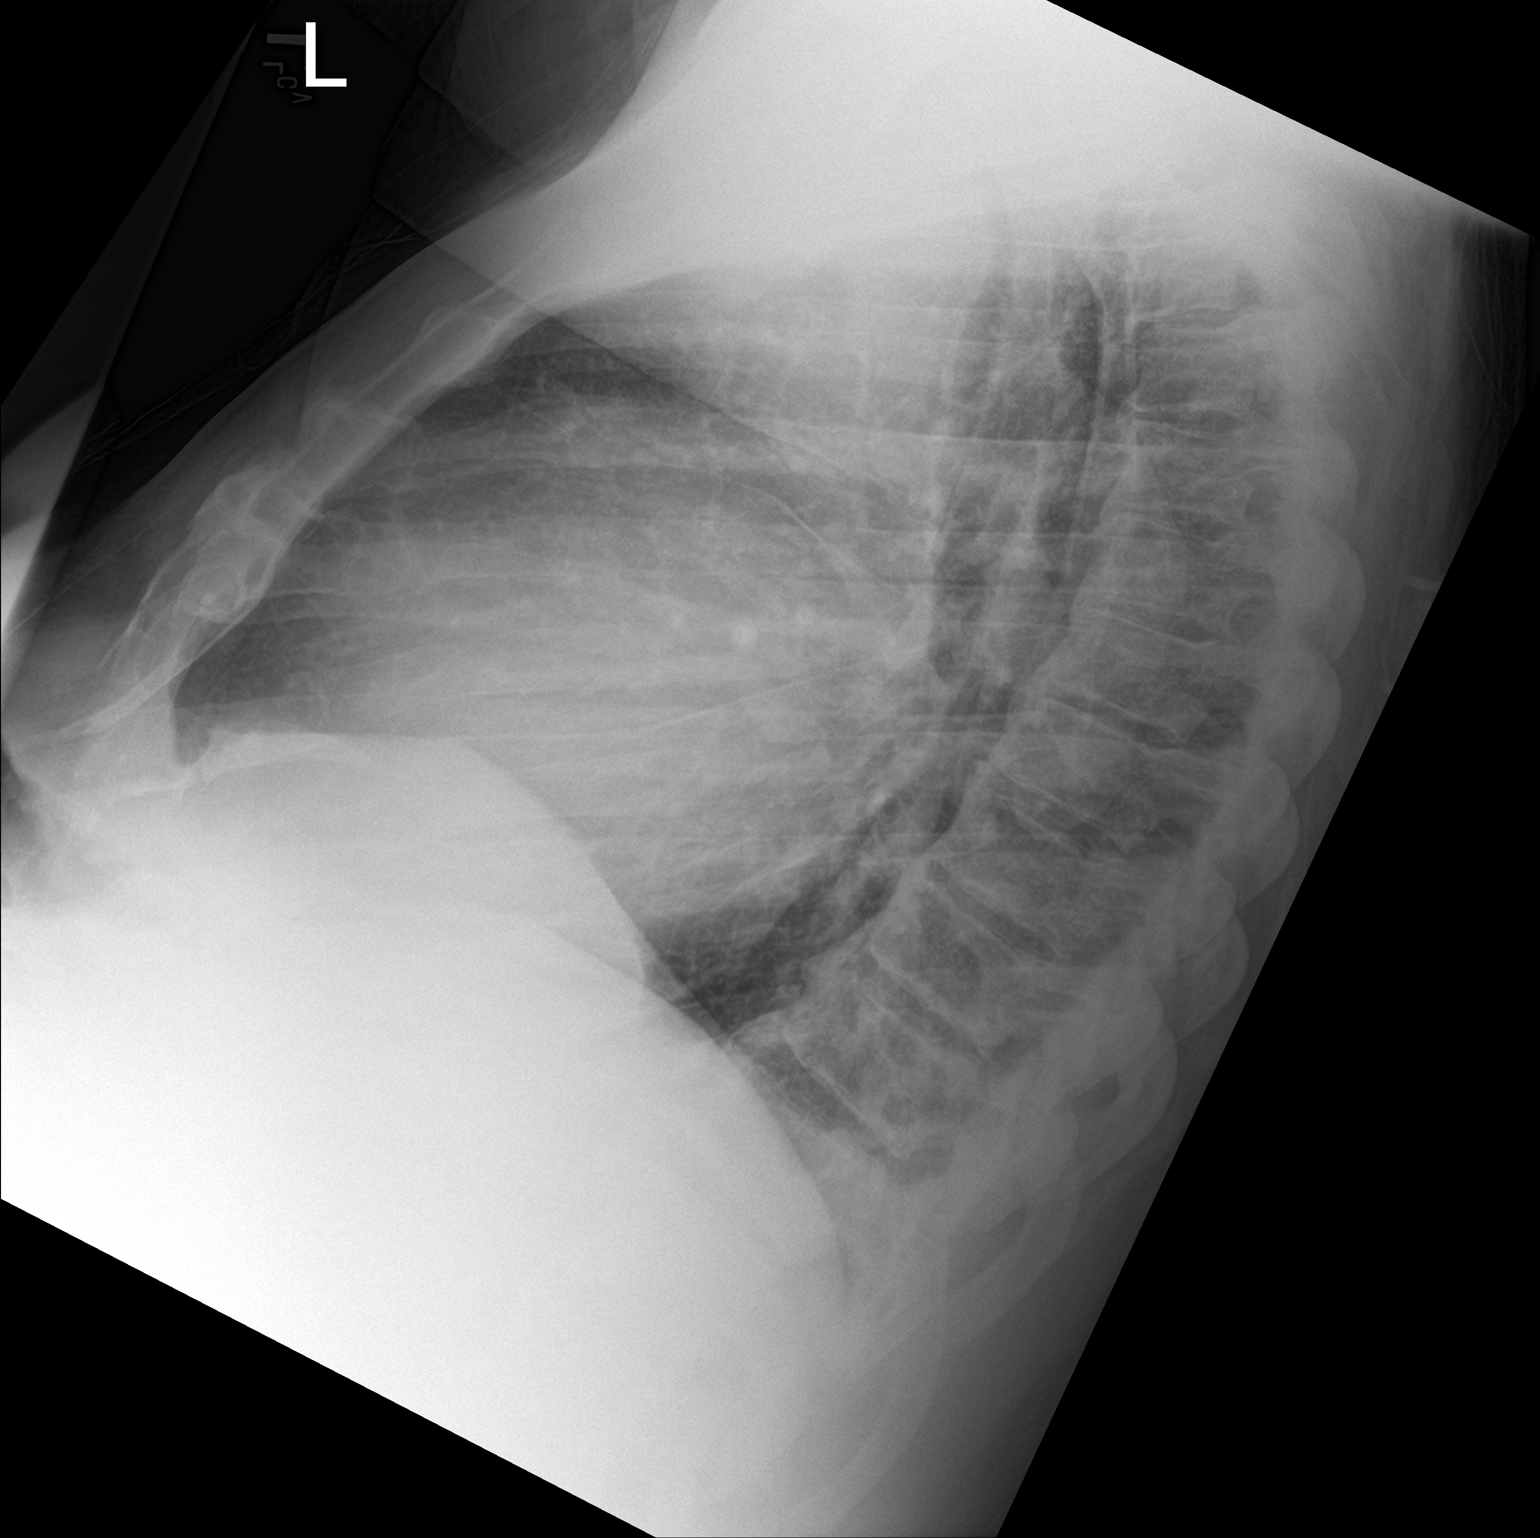

[chest ap]
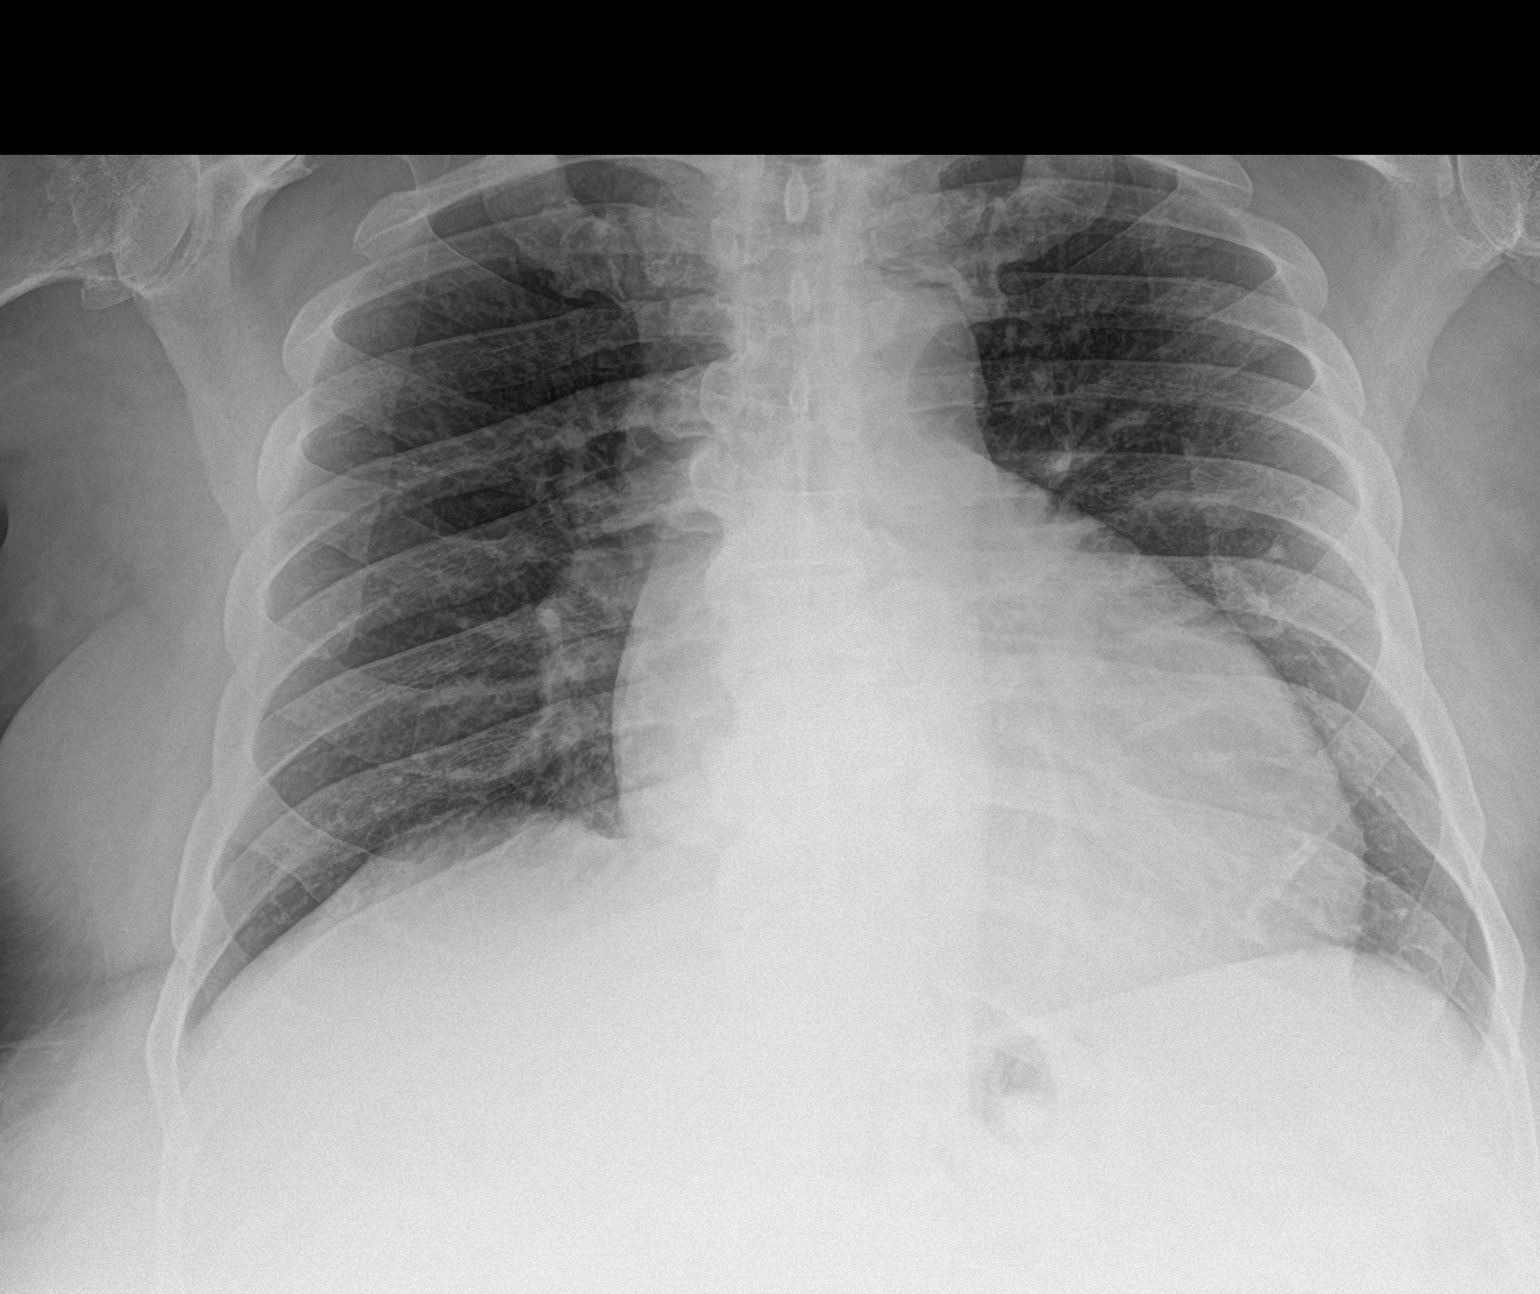

[2 of 2 positions shown; findings below may reference images not displayed]

FINDINGS: Stable cardiomediastinal silhouette. No pneumothorax or pleural
effusion is noted. Both lungs are clear. The visualized skeletal
structures are unremarkable.
IMPRESSION: No active cardiopulmonary disease.

## 2021-08-01 NOTE — Progress Notes (Signed)
Patient ID: LORENE SAMAAN, male    DOB: May 12, 1951, 70 y.o.   MRN: [id number]  HPI  Mr Kohlbeck is a 70 y/o male with a history of hyperlipidemia, HTN, CKD and chronic heart failure.   Echo report from 01/07/21 reviewed and showed an EF of 25-30% along with severe LVH and mild MR.   Was in the ED 03/07/21 due to weakness along with dark urine. Evaluated and released. Admitted 02/02/21 due to shortness of breath, edema and weight gain. Cardiology consult obtained. Was drinking ~ 1 gallon of water/ day. Given IV dopamine and IV lasix. Transitioned to oral medications. Elevated troponin thought to be due to demand ischemia. Discharged after 7 days.   He presents today for a follow-up visit with a chief complaint of minimal shortness of breath upon moderate exertion. He describes this as chronic in nature having been present for several years. He has associated cough, difficulty sleeping and slight weight gain along with this. He denies any dizziness, fatigue, chest pain, pedal edema, palpitations or abdominal distention.   Past Medical History:  Diagnosis Date   CHF (congestive heart failure) (HCC)    Chronic kidney disease    Hyperlipidemia    Hypertension    Peripheral edema     Social History   Tobacco Use   Smoking status: Never   Smokeless tobacco: Never  Substance Use Topics   Alcohol use: Not on file   No Known Allergies   Prior to Admission medications   Medication Sig Start Date End Date Taking? Authorizing Provider  acetaminophen (TYLENOL) 500 MG tablet Take 500 mg by mouth every 6 (six) hours as needed.   Yes [provider]  aspirin EC 81 MG tablet Take 1 tablet (81 mg total) by mouth daily. Swallow whole. 04/13/21  Yes Clarisa Kindred A, FNP  atorvastatin (LIPITOR) 80 MG tablet Take 1 tablet (80 mg total) by mouth at bedtime. 04/13/21  Yes Clarisa Kindred A, FNP  dapagliflozin propanediol (FARXIGA) 10 MG TABS tablet Take 1 tablet (10 mg total) by mouth daily before  breakfast. 05/01/21  Yes Gollan, Tollie Pizza, MD  furosemide (LASIX) 40 MG tablet Take 1 tablet (40 mg total) by mouth daily. Patient taking differently: Take 20 mg by mouth daily. 04/13/21  Yes Clarisa Kindred A, FNP  metoprolol succinate (TOPROL XL) 50 MG 24 hr tablet Take 1 tablet (50 mg total) by mouth daily. Take with or immediately following a meal. 04/13/21 04/13/22 Yes Jaimey Franchini, Inetta Fermo A, FNP  potassium chloride SA (KLOR-CON) 20 MEQ tablet Take 1 tablet (20 mEq total) by mouth daily. 04/13/21  Yes Delma Freeze, FNP  PROAIR HFA [phone] Base) MCG/ACT inhaler Inhale 2 puff using inhaler every four to six hours as needed for wheezing, cough, shortness of breath 12/09/20  Yes [provider]  sacubitril-valsartan (ENTRESTO) 24-26 MG Take 1 tablet by mouth 2 (two) times daily. 04/14/21  Yes Delma Freeze, FNP   Review of Systems  Constitutional:  Negative for appetite change (decreased) and fatigue.  HENT:  Negative for congestion and postnasal drip.   Eyes: Negative.   Respiratory:  Positive for cough (productive) and shortness of breath. Negative for chest tightness.   Cardiovascular:  Negative for chest pain, palpitations and leg swelling.  Gastrointestinal:  Negative for abdominal distention and abdominal pain.  Endocrine: Negative.   Genitourinary: Negative.   Musculoskeletal:  Positive for arthralgias (knee pain). Negative for back pain.  Skin: Negative.   Allergic/Immunologic: Negative.  Neurological:  Negative for dizziness and light-headedness.  Hematological:  Negative for adenopathy. Does not bruise/bleed easily.  Psychiatric/Behavioral:  Positive for sleep disturbance (sleeping on 2 pillows). Negative for dysphoric mood. The patient is not nervous/anxious.    Vitals:   08/03/21 1453  BP: (!) 122/99  Pulse: 85  Resp: 18  SpO2: 100%  Weight: 232 lb (105.2 kg)  Height: 6\' 2"  (1.88 m)   Wt Readings from Last 3 Encounters:  08/03/21 232 lb (105.2 kg)  05/05/21 217 lb 8 oz (98.7  kg)  03/10/21 245 lb 2 oz (111.2 kg)   Lab Results  Component Value Date   CREATININE 1.10 05/05/2021   CREATININE 1.38 (H) 03/10/2021   CREATININE 1.63 (H) 03/07/2021   Physical Exam Vitals and nursing note reviewed. Exam conducted with a chaperone present (significant other).  Constitutional:      Appearance: Normal appearance.  HENT:     Head: Normocephalic and atraumatic.  Cardiovascular:     Rate and Rhythm: Normal rate and regular rhythm.  Pulmonary:     Effort: Pulmonary effort is normal. No respiratory distress.     Breath sounds: No wheezing or rales.  Abdominal:     General: There is no distension.     Tenderness: There is no abdominal tenderness.  Musculoskeletal:        General: No tenderness.     Cervical back: Normal range of motion and neck supple.     Right lower leg: No edema.     Left lower leg: No edema.  Skin:    General: Skin is warm and dry.  Neurological:     General: No focal deficit present.     Mental Status: He is alert and oriented to person, place, and time.  Psychiatric:        Mood and Affect: Mood normal.        Behavior: Behavior normal.        Thought Content: Thought content normal.    Assessment & Plan:  1: Chronic heart failure with reduced ejection fraction- - NYHA class II - euvolemic today - weighing daily; reminded to call for an overnight weight gain of > 2 pounds or a weekly weight gain of > 5 pounds - weight up 5 pounds from last visit here 3 months ago - drinking ensure every couple of days - on GDMT of dapagliflozin, metoprolol succinate, entresto - BP has been low in the past but if BP remains good, consider adding spironolactone at next visit  - BNP 02/05/21 was 1011.5  2: HTN- - BP looks good today - has seen PCP at [facility] in the past - BMP 05/05/21 reviewed and showed sodium 136, potassium 4.1, creatinine 1.10 and GFR >60    Medication list reviewed.   Return in 3 months or sooner for any  questions/problems before then.

## 2021-08-03 ENCOUNTER — Ambulatory Visit: Payer: Medicare HMO | Attending: Family | Admitting: Family

## 2021-08-03 ENCOUNTER — Encounter: Payer: Self-pay | Admitting: Family

## 2021-08-03 ENCOUNTER — Other Ambulatory Visit: Payer: Self-pay

## 2021-08-03 VITALS — BP 122/99 | HR 85 | Resp 18 | Ht 74.0 in | Wt 232.0 lb

## 2021-08-03 DIAGNOSIS — E785 Hyperlipidemia, unspecified: Secondary | ICD-10-CM | POA: Diagnosis not present

## 2021-08-03 DIAGNOSIS — Z7982 Long term (current) use of aspirin: Secondary | ICD-10-CM | POA: Diagnosis not present

## 2021-08-03 DIAGNOSIS — I5022 Chronic systolic (congestive) heart failure: Secondary | ICD-10-CM | POA: Diagnosis not present

## 2021-08-03 DIAGNOSIS — N189 Chronic kidney disease, unspecified: Secondary | ICD-10-CM | POA: Insufficient documentation

## 2021-08-03 DIAGNOSIS — I130 Hypertensive heart and chronic kidney disease with heart failure and stage 1 through stage 4 chronic kidney disease, or unspecified chronic kidney disease: Secondary | ICD-10-CM | POA: Insufficient documentation

## 2021-08-03 DIAGNOSIS — Z7984 Long term (current) use of oral hypoglycemic drugs: Secondary | ICD-10-CM | POA: Diagnosis not present

## 2021-08-03 DIAGNOSIS — I10 Essential (primary) hypertension: Secondary | ICD-10-CM | POA: Diagnosis not present

## 2021-08-03 DIAGNOSIS — Z79899 Other long term (current) drug therapy: Secondary | ICD-10-CM | POA: Diagnosis not present

## 2021-08-03 NOTE — Patient Instructions (Signed)
Continue weighing daily and call for an overnight weight gain of > 2 pounds or a weekly weight gain of >5 pounds. 

## 2021-08-27 ENCOUNTER — Telehealth: Payer: Self-pay

## 2021-08-27 NOTE — Telephone Encounter (Signed)
    Received phone call from [place], patient's significant other.  She states that he is not eating nor does she feel that he is drinking as much or peeing as much as he has in prior days.  Complains of being weak, has not weighed himself for a couple weeks due to the weakness.  He is getting him to drink 2-3 ensures a day and the only thing he is eating is 2 fried eggs.  When questioned she states that his urine is a dark brown in color, and that his abdomen appears bigger than normal.  When questioned if he is constipated she states that he did have a bowel movement yesterday and that it was yellow in color.  Encouraged to come into the emergency room, patient wants to give it 1 more day and if he is not improved by tomorrow Delorise Shiner will contact EMS and they will come into the emergency room.  Discussed with Clarisa Kindred NP.  Tresa Endo RN [facility]

## 2021-08-28 ENCOUNTER — Inpatient Hospital Stay
Admission: EM | Admit: 2021-08-28 | Discharge: 2021-09-09 | DRG: 286 | Disposition: A | Discharge status: Skilled Nursing Facility | Payer: Medicare HMO | Attending: Internal Medicine | Admitting: Internal Medicine

## 2021-08-28 ENCOUNTER — Inpatient Hospital Stay: Payer: Medicare HMO

## 2021-08-28 ENCOUNTER — Encounter: Payer: Self-pay | Admitting: Emergency Medicine

## 2021-08-28 ENCOUNTER — Other Ambulatory Visit: Payer: Self-pay

## 2021-08-28 DIAGNOSIS — I5021 Acute systolic (congestive) heart failure: Secondary | ICD-10-CM | POA: Diagnosis not present

## 2021-08-28 DIAGNOSIS — I255 Ischemic cardiomyopathy: Secondary | ICD-10-CM | POA: Diagnosis present

## 2021-08-28 DIAGNOSIS — L8990 Pressure ulcer of unspecified site, unspecified stage: Secondary | ICD-10-CM | POA: Insufficient documentation

## 2021-08-28 DIAGNOSIS — F1010 Alcohol abuse, uncomplicated: Secondary | ICD-10-CM | POA: Diagnosis present

## 2021-08-28 DIAGNOSIS — N1831 Chronic kidney disease, stage 3a: Secondary | ICD-10-CM | POA: Diagnosis present

## 2021-08-28 DIAGNOSIS — Z792 Long term (current) use of antibiotics: Secondary | ICD-10-CM

## 2021-08-28 DIAGNOSIS — M7989 Other specified soft tissue disorders: Secondary | ICD-10-CM | POA: Diagnosis not present

## 2021-08-28 DIAGNOSIS — I517 Cardiomegaly: Secondary | ICD-10-CM | POA: Diagnosis not present

## 2021-08-28 DIAGNOSIS — Z823 Family history of stroke: Secondary | ICD-10-CM

## 2021-08-28 DIAGNOSIS — R748 Abnormal levels of other serum enzymes: Secondary | ICD-10-CM | POA: Diagnosis not present

## 2021-08-28 DIAGNOSIS — R531 Weakness: Secondary | ICD-10-CM | POA: Diagnosis present

## 2021-08-28 DIAGNOSIS — E8809 Other disorders of plasma-protein metabolism, not elsewhere classified: Secondary | ICD-10-CM | POA: Diagnosis not present

## 2021-08-28 DIAGNOSIS — R7989 Other specified abnormal findings of blood chemistry: Secondary | ICD-10-CM | POA: Diagnosis not present

## 2021-08-28 DIAGNOSIS — E876 Hypokalemia: Secondary | ICD-10-CM | POA: Diagnosis not present

## 2021-08-28 DIAGNOSIS — K7460 Unspecified cirrhosis of liver: Secondary | ICD-10-CM | POA: Diagnosis present

## 2021-08-28 DIAGNOSIS — R188 Other ascites: Secondary | ICD-10-CM | POA: Diagnosis present

## 2021-08-28 DIAGNOSIS — Z515 Encounter for palliative care: Secondary | ICD-10-CM | POA: Diagnosis not present

## 2021-08-28 DIAGNOSIS — L89312 Pressure ulcer of right buttock, stage 2: Secondary | ICD-10-CM | POA: Diagnosis present

## 2021-08-28 DIAGNOSIS — E1122 Type 2 diabetes mellitus with diabetic chronic kidney disease: Secondary | ICD-10-CM | POA: Diagnosis present

## 2021-08-28 DIAGNOSIS — I5043 Acute on chronic combined systolic (congestive) and diastolic (congestive) heart failure: Secondary | ICD-10-CM | POA: Diagnosis present

## 2021-08-28 DIAGNOSIS — I272 Pulmonary hypertension, unspecified: Secondary | ICD-10-CM | POA: Diagnosis present

## 2021-08-28 DIAGNOSIS — I5020 Unspecified systolic (congestive) heart failure: Secondary | ICD-10-CM | POA: Diagnosis not present

## 2021-08-28 DIAGNOSIS — D689 Coagulation defect, unspecified: Secondary | ICD-10-CM | POA: Diagnosis not present

## 2021-08-28 DIAGNOSIS — Z20822 Contact with and (suspected) exposure to covid-19: Secondary | ICD-10-CM | POA: Diagnosis present

## 2021-08-28 DIAGNOSIS — I5023 Acute on chronic systolic (congestive) heart failure: Secondary | ICD-10-CM

## 2021-08-28 DIAGNOSIS — L89302 Pressure ulcer of unspecified buttock, stage 2: Secondary | ICD-10-CM | POA: Diagnosis not present

## 2021-08-28 DIAGNOSIS — R945 Abnormal results of liver function studies: Secondary | ICD-10-CM | POA: Diagnosis not present

## 2021-08-28 DIAGNOSIS — E785 Hyperlipidemia, unspecified: Secondary | ICD-10-CM | POA: Diagnosis present

## 2021-08-28 DIAGNOSIS — I130 Hypertensive heart and chronic kidney disease with heart failure and stage 1 through stage 4 chronic kidney disease, or unspecified chronic kidney disease: Principal | ICD-10-CM | POA: Diagnosis present

## 2021-08-28 DIAGNOSIS — I426 Alcoholic cardiomyopathy: Secondary | ICD-10-CM | POA: Diagnosis not present

## 2021-08-28 DIAGNOSIS — D696 Thrombocytopenia, unspecified: Secondary | ICD-10-CM | POA: Diagnosis present

## 2021-08-28 DIAGNOSIS — R778 Other specified abnormalities of plasma proteins: Secondary | ICD-10-CM | POA: Diagnosis not present

## 2021-08-28 DIAGNOSIS — E119 Type 2 diabetes mellitus without complications: Secondary | ICD-10-CM | POA: Diagnosis not present

## 2021-08-28 DIAGNOSIS — I959 Hypotension, unspecified: Secondary | ICD-10-CM | POA: Diagnosis not present

## 2021-08-28 DIAGNOSIS — I2510 Atherosclerotic heart disease of native coronary artery without angina pectoris: Secondary | ICD-10-CM | POA: Diagnosis present

## 2021-08-28 DIAGNOSIS — R931 Abnormal findings on diagnostic imaging of heart and coronary circulation: Secondary | ICD-10-CM | POA: Diagnosis not present

## 2021-08-28 DIAGNOSIS — I214 Non-ST elevation (NSTEMI) myocardial infarction: Secondary | ICD-10-CM | POA: Diagnosis not present

## 2021-08-28 DIAGNOSIS — Z79899 Other long term (current) drug therapy: Secondary | ICD-10-CM

## 2021-08-28 DIAGNOSIS — I248 Other forms of acute ischemic heart disease: Secondary | ICD-10-CM | POA: Diagnosis present

## 2021-08-28 DIAGNOSIS — Z7982 Long term (current) use of aspirin: Secondary | ICD-10-CM

## 2021-08-28 DIAGNOSIS — L89322 Pressure ulcer of left buttock, stage 2: Secondary | ICD-10-CM | POA: Diagnosis present

## 2021-08-28 DIAGNOSIS — D751 Secondary polycythemia: Secondary | ICD-10-CM | POA: Diagnosis present

## 2021-08-28 DIAGNOSIS — N179 Acute kidney failure, unspecified: Secondary | ICD-10-CM | POA: Diagnosis present

## 2021-08-28 DIAGNOSIS — Z7189 Other specified counseling: Secondary | ICD-10-CM | POA: Diagnosis not present

## 2021-08-28 DIAGNOSIS — M179 Osteoarthritis of knee, unspecified: Secondary | ICD-10-CM | POA: Diagnosis present

## 2021-08-28 DIAGNOSIS — Z789 Other specified health status: Secondary | ICD-10-CM | POA: Diagnosis not present

## 2021-08-28 DIAGNOSIS — I420 Dilated cardiomyopathy: Secondary | ICD-10-CM | POA: Diagnosis not present

## 2021-08-28 DIAGNOSIS — E8770 Fluid overload, unspecified: Secondary | ICD-10-CM | POA: Diagnosis not present

## 2021-08-28 DIAGNOSIS — I440 Atrioventricular block, first degree: Secondary | ICD-10-CM | POA: Diagnosis present

## 2021-08-28 DIAGNOSIS — I7121 Aneurysm of the ascending aorta, without rupture: Secondary | ICD-10-CM | POA: Diagnosis present

## 2021-08-28 DIAGNOSIS — Z7401 Bed confinement status: Secondary | ICD-10-CM

## 2021-08-28 DIAGNOSIS — Z91199 Patient's noncompliance with other medical treatment and regimen due to unspecified reason: Secondary | ICD-10-CM

## 2021-08-28 DIAGNOSIS — E46 Unspecified protein-calorie malnutrition: Secondary | ICD-10-CM | POA: Diagnosis not present

## 2021-08-28 DIAGNOSIS — R262 Difficulty in walking, not elsewhere classified: Secondary | ICD-10-CM | POA: Diagnosis not present

## 2021-08-28 LAB — RESP PANEL BY RT-PCR (FLU A&B, COVID) ARPGX2
Influenza A by PCR: NEGATIVE
Influenza B by PCR: NEGATIVE
SARS Coronavirus 2 by RT PCR: NEGATIVE

## 2021-08-28 LAB — CBC WITH DIFFERENTIAL/PLATELET
Abs Immature Granulocytes: 0.01 10*3/uL (ref 0.00–0.07)
Basophils Absolute: 0 10*3/uL (ref 0.0–0.1)
Basophils Relative: 1 %
Eosinophils Absolute: 0 10*3/uL (ref 0.0–0.5)
Eosinophils Relative: 1 %
HCT: 47.2 % (ref 39.0–52.0)
Hemoglobin: 16.7 g/dL (ref 13.0–17.0)
Immature Granulocytes: 0 %
Lymphocytes Relative: 15 %
Lymphs Abs: 0.6 10*3/uL — ABNORMAL LOW (ref 0.7–4.0)
MCH: 30.5 pg (ref 26.0–34.0)
MCHC: 35.4 g/dL (ref 30.0–36.0)
MCV: 86.1 fL (ref 80.0–100.0)
Monocytes Absolute: 0.5 10*3/uL (ref 0.1–1.0)
Monocytes Relative: 13 %
Neutro Abs: 2.7 10*3/uL (ref 1.7–7.7)
Neutrophils Relative %: 70 %
Platelets: 133 10*3/uL — ABNORMAL LOW (ref 150–400)
RBC: 5.48 MIL/uL (ref 4.22–5.81)
RDW: 17.6 % — ABNORMAL HIGH (ref 11.5–15.5)
WBC: 3.8 10*3/uL — ABNORMAL LOW (ref 4.0–10.5)
nRBC: 0 % (ref 0.0–0.2)

## 2021-08-28 LAB — COMPREHENSIVE METABOLIC PANEL
ALT: 106 U/L — ABNORMAL HIGH (ref 0–44)
AST: 136 U/L — ABNORMAL HIGH (ref 15–41)
Albumin: 2.9 g/dL — ABNORMAL LOW (ref 3.5–5.0)
Alkaline Phosphatase: 163 U/L — ABNORMAL HIGH (ref 38–126)
Anion gap: 11 (ref 5–15)
BUN: 60 mg/dL — ABNORMAL HIGH (ref 8–23)
CO2: 17 mmol/L — ABNORMAL LOW (ref 22–32)
Calcium: 8.9 mg/dL (ref 8.9–10.3)
Chloride: 105 mmol/L (ref 98–111)
Creatinine, Ser: 2.06 mg/dL — ABNORMAL HIGH (ref 0.61–1.24)
GFR, Estimated: 34 mL/min — ABNORMAL LOW (ref >60)
Glucose, Bld: 114 mg/dL — ABNORMAL HIGH (ref 70–99)
Potassium: 4.9 mmol/L (ref 3.5–5.1)
Sodium: 133 mmol/L — ABNORMAL LOW (ref 135–145)
Total Bilirubin: 4.2 mg/dL — ABNORMAL HIGH (ref 0.3–1.2)
Total Protein: 6.7 g/dL (ref 6.5–8.1)

## 2021-08-28 LAB — BRAIN NATRIURETIC PEPTIDE: B Natriuretic Peptide: 3231.8 pg/mL — ABNORMAL HIGH (ref 0.0–100.0)

## 2021-08-28 LAB — TROPONIN I (HIGH SENSITIVITY)
Troponin I (High Sensitivity): 420 ng/L (ref <18)
Troponin I (High Sensitivity): 327 ng/L (ref <18)

## 2021-08-28 MED ORDER — ACETAMINOPHEN 325 MG PO TABS
650.0000 mg | ORAL_TABLET | Freq: Four times a day (QID) | ORAL | Status: DC | PRN
  Administered 2021-09-05 – 2021-09-08 (×3): 650 mg via ORAL
  Filled 2021-08-28 (×4): qty 2

## 2021-08-28 MED ORDER — ACETAMINOPHEN 650 MG RE SUPP
650.0000 mg | Freq: Four times a day (QID) | RECTAL | Status: DC | PRN
  Filled 2021-08-28: qty 1

## 2021-08-28 MED ORDER — ALBUTEROL SULFATE HFA 108 (90 BASE) MCG/ACT IN AERS: 2.0000 | INHALATION_SPRAY | RESPIRATORY_TRACT | Status: DC | PRN

## 2021-08-28 MED ORDER — ASPIRIN 81 MG PO CHEW
324.0000 mg | CHEWABLE_TABLET | Freq: Once | ORAL | Status: AC
  Administered 2021-08-28: 324 mg via ORAL
  Filled 2021-08-28: qty 4

## 2021-08-28 MED ORDER — METOPROLOL SUCCINATE ER 50 MG PO TB24
50.0000 mg | ORAL_TABLET | Freq: Every day | ORAL | Status: DC
  Administered 2021-08-29 – 2021-09-01 (×4): 50 mg via ORAL
  Filled 2021-08-28 (×4): qty 1

## 2021-08-28 MED ORDER — ONDANSETRON HCL 4 MG PO TABS: 4.0000 mg | ORAL_TABLET | Freq: Four times a day (QID) | ORAL | Status: DC | PRN

## 2021-08-28 MED ORDER — ATORVASTATIN CALCIUM 80 MG PO TABS
80.0000 mg | ORAL_TABLET | Freq: Every day | ORAL | Status: DC
  Administered 2021-08-28 – 2021-09-08 (×12): 80 mg via ORAL
  Filled 2021-08-28 (×11): qty 1

## 2021-08-28 MED ORDER — HEPARIN BOLUS VIA INFUSION
4000.0000 [IU] | Freq: Once | INTRAVENOUS | Status: AC
  Administered 2021-08-28: 4000 [IU] via INTRAVENOUS
  Filled 2021-08-28: qty 4000

## 2021-08-28 MED ORDER — FUROSEMIDE 10 MG/ML IJ SOLN
60.0000 mg | Freq: Once | INTRAMUSCULAR | Status: AC
  Administered 2021-08-28: 60 mg via INTRAVENOUS
  Filled 2021-08-28: qty 8

## 2021-08-28 MED ORDER — DAPAGLIFLOZIN PROPANEDIOL 5 MG PO TABS
10.0000 mg | ORAL_TABLET | Freq: Every day | ORAL | Status: DC
  Administered 2021-08-29 – 2021-09-09 (×12): 10 mg via ORAL
  Filled 2021-08-28 (×13): qty 2

## 2021-08-28 MED ORDER — MAGNESIUM HYDROXIDE 400 MG/5ML PO SUSP: 30.0000 mL | Freq: Every day | ORAL | Status: DC | PRN

## 2021-08-28 MED ORDER — HEPARIN (PORCINE) 25000 UT/250ML-% IV SOLN
1500.0000 [IU]/h | INTRAVENOUS | Status: DC
  Administered 2021-08-28: 1300 [IU]/h via INTRAVENOUS
  Filled 2021-08-28 (×2): qty 250

## 2021-08-28 MED ORDER — ALBUTEROL SULFATE (2.5 MG/3ML) 0.083% IN NEBU: 2.5000 mg | INHALATION_SOLUTION | RESPIRATORY_TRACT | Status: DC | PRN

## 2021-08-28 MED ORDER — ONDANSETRON HCL 4 MG/2ML IJ SOLN: 4.0000 mg | Freq: Four times a day (QID) | INTRAMUSCULAR | Status: DC | PRN

## 2021-08-28 MED ORDER — TRAZODONE HCL 50 MG PO TABS
25.0000 mg | ORAL_TABLET | Freq: Every evening | ORAL | Status: DC | PRN
  Administered 2021-09-03 – 2021-09-05 (×2): 25 mg via ORAL
  Filled 2021-08-28 (×4): qty 1

## 2021-08-28 MED ORDER — SACUBITRIL-VALSARTAN 24-26 MG PO TABS
1.0000 | ORAL_TABLET | Freq: Two times a day (BID) | ORAL | Status: DC
  Filled 2021-08-28 (×2): qty 1

## 2021-08-28 MED ORDER — POTASSIUM CHLORIDE CRYS ER 20 MEQ PO TBCR
20.0000 meq | EXTENDED_RELEASE_TABLET | Freq: Every day | ORAL | Status: DC
  Administered 2021-08-29 – 2021-09-04 (×7): 20 meq via ORAL
  Filled 2021-08-28: qty 2
  Filled 2021-08-28 (×2): qty 1
  Filled 2021-08-28 (×3): qty 2
  Filled 2021-08-28: qty 1

## 2021-08-28 MED ORDER — ASPIRIN EC 81 MG PO TBEC
81.0000 mg | DELAYED_RELEASE_TABLET | Freq: Every day | ORAL | Status: DC
  Administered 2021-08-29 – 2021-09-09 (×11): 81 mg via ORAL
  Filled 2021-08-28 (×11): qty 1

## 2021-08-28 MED ORDER — FUROSEMIDE 10 MG/ML IJ SOLN
40.0000 mg | Freq: Two times a day (BID) | INTRAMUSCULAR | Status: DC
  Administered 2021-08-29 – 2021-08-31 (×5): 40 mg via INTRAVENOUS
  Filled 2021-08-28 (×5): qty 4

## 2021-08-28 NOTE — ED Provider Notes (Signed)
[facility] Emergency Department Provider Note  ____________________________________________   I have reviewed the triage vital signs and the nursing notes.   HISTORY  Chief Complaint Weakness and Ascites   History limited by: Not Limited   HPI Tyler Ochoa is a 70 y.o. male who presents to the emergency department today because of concerns for weakness and possible fluid overload.  Patient states over the past 2 weeks he is become progressively weaker.  This has been accompanied by shortness of breath which is worse with exertion.  The patient has also noticed some swelling and fluid in his abdomen.  Denies any pain in his abdomen. He does have a history of heart failure.  Is followed at the heart failure clinic.  He has noticed decreased urine output recently. Denies any recent fevers.    Records reviewed. Per medical record review patient has a history of CHF, CKD, HLD, HTN.  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  Prior to Admission medications   Medication Sig Start Date End Date Taking? Authorizing Provider  acetaminophen (TYLENOL) 500 MG tablet Take 500 mg by mouth every 6 (six) hours as needed.    [provider]  aspirin EC 81 MG tablet Take 1 tablet (81 mg total) by mouth daily. Swallow whole. 04/13/21   Delma Freeze, FNP  atorvastatin (LIPITOR) 80 MG tablet Take 1 tablet (80 mg total) by mouth at bedtime. 04/13/21   Delma Freeze, FNP  dapagliflozin propanediol (FARXIGA) 10 MG TABS tablet Take 1 tablet (10 mg  total) by mouth daily before breakfast. 05/01/21   Gollan, Tollie Pizza, MD  furosemide (LASIX) 40 MG tablet Take 1 tablet (40 mg total) by mouth daily. Patient taking differently: Take 20 mg by mouth daily. 04/13/21   Delma Freeze, FNP  metoprolol succinate (TOPROL XL) 50 MG 24 hr tablet Take 1 tablet (50 mg total) by mouth daily. Take with or immediately following a meal. 04/13/21 04/13/22  Clarisa Kindred A, FNP  potassium chloride SA (KLOR-CON) 20 MEQ tablet Take 1 tablet (20 mEq total) by mouth daily. 04/13/21   Delma Freeze, FNP  PROAIR HFA 108 [phone] Base) MCG/ACT inhaler Inhale 2 puff using inhaler every four to six hours as needed for wheezing, cough, shortness of breath 12/09/20   [provider]  sacubitril-valsartan (ENTRESTO) 24-26 MG Take 1 tablet by mouth 2 (two) times daily. 04/14/21   Delma Freeze, FNP    Allergies Patient has no known allergies.  No family history on file.  Social History Social History   Tobacco Use   Smoking status: Never   Smokeless tobacco: Never  Vaping Use   Vaping Use: Never used    Review of Systems Constitutional: No fever/chills. Positive for generalized weakness. Eyes: No visual changes. ENT: No sore throat. Cardiovascular: Denies chest pain. Respiratory: Positive for shortness of breath. Gastrointestinal: Positive for abdominal distention. Genitourinary: Negative for dysuria. Musculoskeletal: Negative for back pain. Skin: Negative for rash. Neurological: Negative for headaches, focal weakness or numbness.  ____________________________________________   PHYSICAL EXAM:  VITAL SIGNS: ED Triage Vitals  Enc Vitals Group     BP 08/28/21 1452 111/80     Pulse Rate 08/28/21 1452 (!) 104     Resp 08/28/21 1452 16     Temp 08/28/21 1452 98.4 F (36.9 C)     Temp Source 08/28/21 1452 Oral     SpO2 08/28/21 1452 97 %     Weight 08/28/21 1453 224 lb (101.6 kg)     Height 08/28/21 1453 6\' 2"  (1.88 m)     Head Circumference --       Peak Flow --      Pain Score 08/28/21 1453 0    Constitutional: Alert and oriented.  Eyes: Conjunctivae are normal.  ENT      Head: Normocephalic and atraumatic.      Nose: No congestion/rhinnorhea.      Mouth/Throat: Mucous membranes are moist.      Neck: No stridor. Hematological/Lymphatic/Immunilogical: No cervical lymphadenopathy. Cardiovascular: Normal rate, regular rhythm.  No murmurs, rubs, or gallops.  Respiratory: Normal respiratory effort without tachypnea nor retractions. Breath sounds are clear and equal bilaterally. No wheezes/rales/rhonchi. Gastrointestinal: Soft. Distended, consistent with ascites.  Genitourinary: Deferred Musculoskeletal: Normal range of motion in all extremities. Lower extremity edema.  Neurologic:  Normal speech and language. No gross focal neurologic deficits are appreciated.  Skin:  Skin is warm, dry and intact. No rash noted. Psychiatric: Mood and affect are normal. Speech and behavior are normal. Patient exhibits appropriate insight and judgment.  ____________________________________________    LABS (pertinent positives/negatives)  Trop hs 420 BNP 3231.8 CMP na 133, k 4.9, glu 114, cr 2.06 CBC wbc 3.8, hgb 16.7, plt 133 ____________________________________________   EKG  I, 08/30/21, attending physician, personally viewed and interpreted this EKG  EKG Time: 1509 Rate: 107 Rhythm: sinus tachycardia with 1st degree av block Axis: normal Intervals: qtc 437 QRS: narrow, q waves v1, v2 ST changes: no st elevation Impression: abnormal ekg  ____________________________________________    PROCEDURES  Procedures  ____________________________________________   INITIAL IMPRESSION / ASSESSMENT AND PLAN / ED COURSE  Pertinent labs & imaging results that were available during my care of the patient were reviewed by me and considered in my medical decision making (see chart for details).   Patient presented to the emergency  department today because of concerns for weakness that has been present for the past 2 weeks as well as concerns for fluid overload.  On exam patient appears to have ascites of the abdomen.  No abdominal pain.  Work-up here is notable for significant elevation of both troponin and BNP.  Patient denies any chest pain.  This time unclear if troponin elevation is secondary to fluid overload or possible heart attack although I have lower suspicion for the latter.  Patient was given aspirin.  Will give patient Lasix here in the emergency department.  Will plan on admission.  ____________________________________________   FINAL CLINICAL IMPRESSION(S) / ED DIAGNOSES  Final diagnoses:  Hypervolemia, unspecified hypervolemia type  Weakness     Note: This dictation was prepared with Dragon dictation. Any transcriptional errors that result from this process are unintentional     1510, MD 08/28/21 2154

## 2021-08-28 NOTE — ED Triage Notes (Signed)
Pt here with c/o increasing weakness over the past 2 weeks, is a pt at the HF clinic and was told by them to come here, ascites on abd making it hard to eat anything. NAD.

## 2021-08-28 NOTE — Consult Note (Signed)
ANTICOAGULATION CONSULT NOTE - Consult  Pharmacy Consult for Heparin gtt Indication: chest pain/ACS  No Known Allergies  Patient Measurements: Height: 6\' 2"  (188 cm) Weight: 101.6 kg (224 lb) IBW/kg (Calculated) : 82.2 Heparin Dosing Weight: 101.6kg   Vital Signs: Temp: 98.1 F (36.7 C) (10/21 2045) Temp Source: Oral (10/21 2045) BP: 115/71 (10/21 2045) Pulse Rate: 100 (10/21 2045)  Labs: Recent Labs    08/28/21 1506 08/28/21 2120  HGB 16.7  --   HCT 47.2  --   PLT 133*  --   CREATININE 2.06*  --   TROPONINIHS 420* 327*    Estimated Creatinine Clearance: 43.7 mL/min (A) (by C-G formula based on SCr of 2.06 mg/dL (H)).   Medications:  PTA: ASA 81mg  on initial review, but no AC. Inpatient: Hep gtt  Assessment: 70yo Male w/ h/o CHF, CKD, HLD, HTN presenting with weakness progressing x2wks, SOB, DOE, and c/f volume overload. Troponins 420>327; BNP 3231.8. Pharmacy consulted for mgmt of heparin gtt.  Date Time aPTT/HL Rate/Comment       Baseline Labs: aPTT - pending INR -  pending Hgb - 16.7 > __ Plts - 133 > __  Goal of Therapy:  Heparin level 0.3-0.7 units/ml Monitor platelets by anticoagulation protocol: Yes   Plan:  Give 4000 units bolus x 1; then start heparin infusion at 1300 units/hr Check anti-Xa level in 6 hours and daily while on heparin Continue to monitor H&H and platelets  , PharmD, [facility] Clinical Pharmacist 08/28/2021,10:27 PM

## 2021-08-28 NOTE — H&P (Addendum)
[place]   PATIENT NAME: Tyler Ochoa    MR#:  [id number]  DATE OF BIRTH:  June 08, 1951  DATE OF ADMISSION:  08/28/2021  PRIMARY CARE PHYSICIAN: Manson Allan, MD   Patient is coming from: Home  REQUESTING/REFERRING PHYSICIAN: Nance Pear, MD  CHIEF COMPLAINT:   Chief Complaint  Patient presents with  . Weakness  . Ascites    HISTORY OF PRESENT ILLNESS:  Tyler Ochoa is a 70 y.o. male with medical history significant for systolic CHF,  dyslipidemia and hypertension, who presented to the emergency room due to onset of generalized weakness and fluid overload over the last couple weeks.  He admitted to dyspnea worsening with exertion.  He admitted to dyspnea on exertion and 3 pillow orthopnea.  He noted bilateral lower extremity edema without significant worsening but noticed more abdominal distention.  He admits to dry cough without wheezing.  No chest pain or tightness.  He has been having diminished urine output without dysuria or urinary urgency, hematuria or flank pain.   ED Course: Upon presentation to the emergency room, vital signs revealed a heart rate of 104 but where otherwise normal.  Labs revealed a BUN of 16 creatinine 2.06 compared to 20/1.16/28, alk phos of 163 and albumin 2.9 with 2 protein of 6.7.  AST was 136 and ALT 106 and total bilirubin was 4.2.  Sodium was 133 and CO2 17.  BNP was 3231.8.  High-sensitivity troponin I was 420 and later 327 CBC showed mild leukopenia.  EKG as reviewed by me : EKG showed sinus tachycardia with a rate of 107 with first-degree AV block and PVCs and Q waves anteroseptally. Imaging: Chest x-ray is currently pending.  The patient was given 60 mg of IV Lasix and 4 baby aspirin.  He will be admitted to a PCU bed for further evaluation and management. PAST MEDICAL HISTORY:   Past Medical History:  Diagnosis Date  . CHF (congestive heart failure) ([place])   . Chronic kidney disease   . Hyperlipidemia   . Hypertension    . Peripheral edema     PAST SURGICAL HISTORY:    Right knee arthroscopy.  SOCIAL HISTORY:   Social History   Tobacco Use  . Smoking status: Never  . Smokeless tobacco: Never  Substance Use Topics  . Alcohol use: Not on file   No history of tobacco abuse alcohol abuse or illicit drug use. FAMILY HISTORY:  Positive for CVA and cancer.  DRUG ALLERGIES:  No Known Allergies  REVIEW OF SYSTEMS:   ROS As per history of present illness. All pertinent systems were reviewed above. Constitutional, HEENT, cardiovascular, respiratory, GI, GU, musculoskeletal, neuro, psychiatric, endocrine, integumentary and hematologic systems were reviewed and are otherwise negative/unremarkable except for positive findings mentioned above in the HPI.   MEDICATIONS AT HOME:   Prior to Admission medications   Medication Sig Start Date End Date Taking? Authorizing Provider  acetaminophen (TYLENOL) 500 MG tablet Take 500 mg by mouth every 6 (six) hours as needed.    [provider]  aspirin EC 81 MG tablet Take 1 tablet (81 mg total) by mouth daily. Swallow whole. 04/13/21   Alisa Graff, FNP  atorvastatin (LIPITOR) 80 MG tablet Take 1 tablet (80 mg total) by mouth at bedtime. 04/13/21   Alisa Graff, FNP  dapagliflozin propanediol (FARXIGA) 10 MG TABS tablet Take 1 tablet (10 mg total) by mouth daily before breakfast. 05/01/21   Rockey Situ, Kathlene November, MD  furosemide (  LASIX) 40 MG tablet Take 1 tablet (40 mg total) by mouth daily. Patient taking differently: Take 20 mg by mouth daily. 04/13/21   Alisa Graff, FNP  metoprolol succinate (TOPROL XL) 50 MG 24 hr tablet Take 1 tablet (50 mg total) by mouth daily. Take with or immediately following a meal. 04/13/21 04/13/22  Darylene Price A, FNP  potassium chloride SA (KLOR-CON) 20 MEQ tablet Take 1 tablet (20 mEq total) by mouth daily. 04/13/21   Alisa Graff, FNP  PROAIR HFA 108 [phone] Base) MCG/ACT inhaler Inhale 2 puff using inhaler every four to six hours  as needed for wheezing, cough, shortness of breath 12/09/20   [provider]  sacubitril-valsartan (ENTRESTO) 24-26 MG Take 1 tablet by mouth 2 (two) times daily. 04/14/21   Alisa Graff, FNP      VITAL SIGNS:  Blood pressure 115/71, pulse 100, temperature 98.1 F (36.7 C), temperature source Oral, resp. rate 18, height 6' 2" (1.88 m), weight 101.6 kg, SpO2 98 %.  PHYSICAL EXAMINATION:  Physical Exam  GENERAL:  70 y.o.-year-old African-American male patient lying in the bed with mild conversational dyspnea. EYES: Pupils equal, round, reactive to light and accommodation. No scleral icterus. Extraocular muscles intact.  HEENT: Head atraumatic, normocephalic. Oropharynx and nasopharynx clear.  NECK:  Supple, no jugular venous distention. No thyroid enlargement, no tenderness.  LUNGS: Slightly diminished bibasilar breath sounds. CARDIOVASCULAR: Regular rate and rhythm, S1, S2 normal. No murmurs, rubs, or gallops.  ABDOMEN: Soft, distended with mild shifting dullness.,  But nontender. Bowel sounds present. No organomegaly or mass.  EXTREMITIES: Bilateral lower extremity pitting edema that is 3+ in the right leg and 2+ in the left leg with no cyanosis, or clubbing.  NEUROLOGIC: Cranial nerves II through XII are intact. Muscle strength 5/5 in all extremities. Sensation intact. Gait not checked.  PSYCHIATRIC: The patient is alert and oriented x 3.  Normal affect and good eye contact. SKIN: No obvious rash, lesion, or ulcer.   LABORATORY PANEL:   CBC Recent Labs  Lab 08/28/21 1506  WBC 3.8*  HGB 16.7  HCT 47.2  PLT 133*   ------------------------------------------------------------------------------------------------------------------  Chemistries  Recent Labs  Lab 08/28/21 1506  NA 133*  K 4.9  CL 105  CO2 17*  GLUCOSE 114*  BUN 60*  CREATININE 2.06*  CALCIUM 8.9  AST 136*  ALT 106*  ALKPHOS 163*  BILITOT 4.2*    ------------------------------------------------------------------------------------------------------------------  Cardiac Enzymes No results for input(s): TROPONINI in the last 168 hours. ------------------------------------------------------------------------------------------------------------------  RADIOLOGY:  DG Chest Portable 1 View  Result Date: 08/28/2021 CLINICAL DATA:  Increasing weakness x2 weeks. EXAM: PORTABLE CHEST 1 VIEW COMPARISON:  February 02, 2021 FINDINGS: The cardiac silhouette is moderately enlarged and unchanged in size. Both lungs are clear. Degenerative changes are seen throughout the thoracic spine. IMPRESSION: Stable cardiomegaly without acute or active cardiopulmonary disease. Electronically Signed   By: Virgina Norfolk M.D.   On: 08/28/2021 22:13      IMPRESSION AND PLAN:  Active Problems:   NSTEMI (non-ST elevated myocardial infarction) ([place])  1.  Acute on chronic systolic CHF. - The patient will be admitted to a PCU bed. - We will continue diuresis with IV Lasix. - We will follow serial troponin I's. - We will continue Toprol-XL and Entresto as well as [place]. - 2D echo and a cardiology consult to be obtained. - I notified Dr. Rayann Heman about the patient.  2.  Elevated troponin that is currently tapering down.  The  patient could be having demand ischemia associated with acute CHF and could be very well possibly having non-ST elevation MI contributing to his acute CHF. - He will be placed on continued high-dose statin as well as aspirin. - We will place him on IV heparin for now. - As needed sublingual nitroglycerin and morphine sulfate and will be provided. - We will continue Toprol-XL. -2D echo and cardiology consult to be obtained as mentioned above.  3.  Acute kidney injury. - This likely prerenal secondary to acute CHF. - We will follow BMP with diuresis. - We will hold off Entresto and nephrotoxins.  4.  Dyslipidemia. - We will continue  statin therapy.  5.  Essential hypertension. - We will continue Toprol-XL and Entresto.  6.  Right more than left lower extremity edema. - We will obtain right lower extremity venous Doppler to rule out DVT.  DVT prophylaxis: IV heparin. Code Status: full code. Family Communication:  The plan of care was discussed in details with the patient and his girlfriend . I answered all questions. The patient agreed to proceed with the above mentioned plan. Further management will depend upon hospital course. Disposition Plan: Back to previous home environment Consults called: Cardiology. All the records are reviewed and case discussed with ED provider.  Status is: Inpatient   Remains inpatient appropriate because:Ongoing diagnostic testing needed not appropriate for outpatient work up, Unsafe d/c plan, IV treatments appropriate due to intensity of illness or inability to take PO, and Inpatient level of care appropriate due to severity of illness   Dispo: The patient is from: Home              Anticipated d/c is to: Home              Patient currently is not medically stable to d/c.              Difficult to place patient: No  TOTAL TIME TAKING CARE OF THIS PATIENT: 55 minutes.     Christel Mormon M.D on 08/28/2021 at 10:20 PM  [facility]   From 7 PM-7 AM, contact night-coverage [email or web address]  CC: Primary care physician; Manson Allan, MD

## 2021-08-29 ENCOUNTER — Inpatient Hospital Stay: Payer: Medicare HMO

## 2021-08-29 ENCOUNTER — Inpatient Hospital Stay
Admit: 2021-08-29 | Discharge: 2021-08-29 | Disposition: A | Discharge status: Home / Self Care | Payer: Medicare HMO | Attending: Family Medicine | Admitting: Family Medicine

## 2021-08-29 DIAGNOSIS — I5043 Acute on chronic combined systolic (congestive) and diastolic (congestive) heart failure: Secondary | ICD-10-CM

## 2021-08-29 DIAGNOSIS — R262 Difficulty in walking, not elsewhere classified: Secondary | ICD-10-CM | POA: Diagnosis not present

## 2021-08-29 DIAGNOSIS — M7989 Other specified soft tissue disorders: Secondary | ICD-10-CM

## 2021-08-29 DIAGNOSIS — N179 Acute kidney failure, unspecified: Secondary | ICD-10-CM | POA: Diagnosis not present

## 2021-08-29 DIAGNOSIS — R778 Other specified abnormalities of plasma proteins: Secondary | ICD-10-CM

## 2021-08-29 DIAGNOSIS — R7989 Other specified abnormal findings of blood chemistry: Secondary | ICD-10-CM

## 2021-08-29 DIAGNOSIS — R748 Abnormal levels of other serum enzymes: Secondary | ICD-10-CM

## 2021-08-29 LAB — CBC
HCT: 52.5 % — ABNORMAL HIGH (ref 39.0–52.0)
Hemoglobin: 17.2 g/dL — ABNORMAL HIGH (ref 13.0–17.0)
MCH: 28.3 pg (ref 26.0–34.0)
MCHC: 32.8 g/dL (ref 30.0–36.0)
MCV: 86.3 fL (ref 80.0–100.0)
Platelets: 135 10*3/uL — ABNORMAL LOW (ref 150–400)
RBC: 6.08 MIL/uL — ABNORMAL HIGH (ref 4.22–5.81)
RDW: 18.4 % — ABNORMAL HIGH (ref 11.5–15.5)
WBC: 4.3 10*3/uL (ref 4.0–10.5)
nRBC: 0 % (ref 0.0–0.2)

## 2021-08-29 LAB — ECHOCARDIOGRAM COMPLETE
AR max vel: 1.98 cm2
AV Area VTI: 1.78 cm2
AV Area mean vel: 1.76 cm2
AV Mean grad: 3 mmHg
AV Peak grad: 4.2 mmHg
Ao pk vel: 1.02 m/s
Calc EF: 23.1 %
Height: 74 in
S' Lateral: 4.55 cm
Single Plane A2C EF: 32.4 %
Single Plane A4C EF: 13.2 %
Weight: 3584 oz

## 2021-08-29 LAB — BASIC METABOLIC PANEL
Anion gap: 13 (ref 5–15)
BUN: 64 mg/dL — ABNORMAL HIGH (ref 8–23)
CO2: 18 mmol/L — ABNORMAL LOW (ref 22–32)
Calcium: 9 mg/dL (ref 8.9–10.3)
Chloride: 105 mmol/L (ref 98–111)
Creatinine, Ser: 1.88 mg/dL — ABNORMAL HIGH (ref 0.61–1.24)
GFR, Estimated: 38 mL/min — ABNORMAL LOW (ref >60)
Glucose, Bld: 94 mg/dL (ref 70–99)
Potassium: 4.5 mmol/L (ref 3.5–5.1)
Sodium: 136 mmol/L (ref 135–145)

## 2021-08-29 LAB — HEPARIN LEVEL (UNFRACTIONATED)
Heparin Unfractionated: 0.37 IU/mL (ref 0.30–0.70)
Heparin Unfractionated: 0.25 IU/mL — ABNORMAL LOW (ref 0.30–0.70)
Heparin Unfractionated: <0.1 IU/mL — ABNORMAL LOW (ref 0.30–0.70)

## 2021-08-29 LAB — APTT: aPTT: 138 seconds — ABNORMAL HIGH (ref 24–36)

## 2021-08-29 LAB — PROTIME-INR
INR: 1.8 — ABNORMAL HIGH (ref 0.8–1.2)
Prothrombin Time: 20.7 seconds — ABNORMAL HIGH (ref 11.4–15.2)

## 2021-08-29 MED ORDER — HEPARIN SODIUM (PORCINE) 5000 UNIT/ML IJ SOLN
5000.0000 [IU] | Freq: Three times a day (TID) | INTRAMUSCULAR | Status: DC
  Administered 2021-08-29 – 2021-09-08 (×28): 5000 [IU] via SUBCUTANEOUS
  Filled 2021-08-29 (×28): qty 1

## 2021-08-29 MED ORDER — PERFLUTREN LIPID MICROSPHERE
1.0000 mL | INTRAVENOUS | Status: AC | PRN
  Administered 2021-08-29: 4 mL via INTRAVENOUS
  Filled 2021-08-29: qty 10

## 2021-08-29 MED ORDER — HEPARIN BOLUS VIA INFUSION
1500.0000 [IU] | Freq: Once | INTRAVENOUS | Status: AC
  Administered 2021-08-29: 1500 [IU] via INTRAVENOUS
  Filled 2021-08-29: qty 1500

## 2021-08-29 NOTE — Progress Notes (Signed)
PROGRESS NOTE  Tyler Ochoa QJJ:[id number] DOB: 05-26-1951   PCP: Queen Slough, MD  Patient is from:.  [place] rolling walker and cane.  Lives with his girlfriend.  DOA: 08/28/2021 LOS: 1  Chief complaints:  Chief Complaint  Patient presents with   Weakness   Ascites     Brief Narrative / Interim history: 70 year old male with PMH of systolic CHF, chronic edema, osteoarthritis, debility, HTN and HLD presenting with generalized weakness, dry cough, DOE, BLE edema and orthopnea, and admitted with acute on chronic systolic CHF, possible non-STEMI and AKI.  BNP elevated to 3200.  Troponin elevated to 420 and improved to 327.  Started on IV Lasix and IV heparin.  Cardiology consulted.  Subjective: Seen and examined earlier this morning.  No major events overnight of this morning.  Not a great historian.  Reports difficulty walking due to dizziness.  He has edema and orthopnea but unchanged from baseline per patient.  He reports using 3 pillows at baseline.  He denies chest pain, GI or UTI symptoms.  He states he lost his brother about a week ago.   Objective: Vitals:   08/29/21 0800 08/29/21 1100 08/29/21 1200 08/29/21 1500  BP: 117/83 109/82 108/81 111/82  Pulse: (!) 101 94 85 80  Resp: 16 18 20 20   Temp:      TempSrc:      SpO2: 92% 91% 91% 95%  Weight:      Height:        Intake/Output Summary (Last 24 hours) at 08/29/2021 1650 Last data filed at 08/29/2021 1239 Gross per 24 hour  Intake --  Output 200 ml  Net -200 ml   Filed Weights   08/28/21 1453  Weight: 101.6 kg    Examination:  GENERAL: No apparent distress.  Nontoxic. HEENT: MMM.  Vision and hearing grossly intact.  NECK: Supple.  No apparent JVD.  RESP:  No IWOB.  Fair aeration bilaterally. CVS: IR at 110.  Heart sounds normal.  ABD/GI/GU: BS+. Abd soft, NTND.  MSK/EXT:  Moves extremities. No apparent deformity.  1+ BLE edema. SKIN: no apparent skin lesion or wound NEURO: Awake, alert and oriented  x4 except date.  No apparent focal neuro deficit. PSYCH: Calm. Normal affect.   Procedures:  None  Microbiology summarized: COVID-19 and influenza PCR nonreactive.  Assessment & Plan: Acute on chronic combined CHF: TTE with LVEF of <20% (25 to 30% in 01/2021), G3 DD and moderate RAE.  Reportedly had generalized weakness, DOE, 3 pillow orthopnea and BLE edema on presentation although  patient denies significant acute change.  BNP elevated to 3200 (higher than baseline).  CXR with stable cardiomegaly.  Elevated troponin likely from demand ischemia as well. -Appreciate input by cardiology -Continue IV Lasix 40 mg twice daily -GDMT-Entresto, Toprol-XL and Farxiga -Monitor fluid status, renal functions and electrolytes. -Sodium and fluid restriction.  Elevated troponin without significant delta: Likely demand ischemia in the setting of the above.  No R WMA on TTE. -Continue statin, aspirin and beta-blocker -Discontinue IV heparin.  AKI/azotemia: Cardiorenal?  Improving with IV diuretics. Recent Labs    02/05/21 0415 02/06/21 0639 02/07/21 0354 02/08/21 04/10/21 02/09/21 0533 03/07/21 1458 03/10/21 1227 05/05/21 1418 08/28/21 1506 08/29/21 0623  BUN 15 13 11 11 12  31* 24* 20 60* 64*  CREATININE 1.74* 1.52* 1.34* 1.35* 1.50* 1.63* 1.38* 1.10 2.06* 1.88*  -Continue monitoring -Consider further work-up if worse or no improvement.  Elevated liver enzymes/hyperbilirubinemia/coagulopathy: Pattern consistent with EtOH/rhabdo.  Also on statin which  could contribute. Recent Labs  Lab 08/28/21 1506  AST 136*  ALT 106*  ALKPHOS 163*  BILITOT 4.2*  PROT 6.7  ALBUMIN 2.9*  -Check CK -Continue monitoring -Consider holding statin if no improvement  Polycythemia: Hemoconcentration?  No history of respiratory failure. Recent Labs    02/02/21 1359 02/03/21 0438 02/04/21 0558 02/08/21 9147 02/09/21 0533 03/07/21 1410 08/28/21 1506 08/29/21 0623  HGB 14.3 14.4 13.7 14.1 13.0 16.1  16.7 17.2*  -Recheck in the morning.  Essential hypertension: Normotensive. -Cardiac meds as above  Hyperlipidemia:  -Continue statin.  Ambulatory dysfunction/knee osteoarthritis -PT/OT eval   BLE edema, Rt>Lt -Lower extremity Doppler to exclude DVT  Thrombocytopenia: Stable Recent Labs  Lab 08/28/21 1506 08/29/21 0623  PLT 133* 135*  -Continue monitoring  Body mass index is 28.76 kg/m.         DVT prophylaxis:  heparin injection 5,000 Units Start: 08/29/21 1700  Code Status: Full code Family Communication: Patient and/or RN. Available if any question.  Level of care: Progressive Cardiac Status is: Inpatient  Remains inpatient appropriate because: Acute on chronic combined CHF/fluid overload requiring IV diuretics       Consultants:  Cardiology   Sch Meds:  Scheduled Meds:  aspirin EC  81 mg Oral Daily   atorvastatin  80 mg Oral QHS   dapagliflozin propanediol  10 mg Oral QAC breakfast   furosemide  40 mg Intravenous Q12H   heparin injection (subcutaneous)  5,000 Units Subcutaneous Q8H   metoprolol succinate  50 mg Oral Q breakfast   potassium chloride SA  20 mEq Oral Daily   Continuous Infusions:   PRN Meds:.acetaminophen **OR** acetaminophen, albuterol, magnesium hydroxide, ondansetron **OR** ondansetron (ZOFRAN) IV, traZODone  Antimicrobials: Anti-infectives (From admission, onward)    None        I have personally reviewed the following labs and images: CBC: Recent Labs  Lab 08/28/21 1506 08/29/21 0623  WBC 3.8* 4.3  NEUTROABS 2.7  --   HGB 16.7 17.2*  HCT 47.2 52.5*  MCV 86.1 86.3  PLT 133* 135*   BMP &GFR Recent Labs  Lab 08/28/21 1506 08/29/21 0623  NA 133* 136  K 4.9 4.5  CL 105 105  CO2 17* 18*  GLUCOSE 114* 94  BUN 60* 64*  CREATININE 2.06* 1.88*  CALCIUM 8.9 9.0   Estimated Creatinine Clearance: 47.9 mL/min (A) (by C-G formula based on SCr of 1.88 mg/dL (H)). Liver & Pancreas: Recent Labs  Lab  08/28/21 1506  AST 136*  ALT 106*  ALKPHOS 163*  BILITOT 4.2*  PROT 6.7  ALBUMIN 2.9*   No results for input(s): LIPASE, AMYLASE in the last 168 hours. No results for input(s): AMMONIA in the last 168 hours. Diabetic: No results for input(s): HGBA1C in the last 72 hours. No results for input(s): GLUCAP in the last 168 hours. Cardiac Enzymes: No results for input(s): CKTOTAL, CKMB, CKMBINDEX, TROPONINI in the last 168 hours. No results for input(s): PROBNP in the last 8760 hours. Coagulation Profile: Recent Labs  Lab 08/29/21 0108  INR 1.8*   Thyroid Function Tests: No results for input(s): TSH, T4TOTAL, FREET4, T3FREE, THYROIDAB in the last 72 hours. Lipid Profile: No results for input(s): CHOL, HDL, LDLCALC, TRIG, CHOLHDL, LDLDIRECT in the last 72 hours. Anemia Panel: No results for input(s): VITAMINB12, FOLATE, FERRITIN, TIBC, IRON, RETICCTPCT in the last 72 hours. Urine analysis:    Component Value Date/Time   COLORURINE YELLOW (A) 03/07/2021 1705   APPEARANCEUR CLEAR (A) 03/07/2021 1705  LABSPEC 1.010 03/07/2021 1705   PHURINE 5.0 03/07/2021 1705   GLUCOSEU >=500 (A) 03/07/2021 1705   HGBUR SMALL (A) 03/07/2021 1705   BILIRUBINUR NEGATIVE 03/07/2021 1705   KETONESUR NEGATIVE 03/07/2021 1705   PROTEINUR 30 (A) 03/07/2021 1705   NITRITE NEGATIVE 03/07/2021 1705   LEUKOCYTESUR NEGATIVE 03/07/2021 1705   Sepsis Labs: Invalid input(s): PROCALCITONIN, LACTICIDVEN  Microbiology: Recent Results (from the past 240 hour(s))  Resp Panel by RT-PCR (Flu A&B, Covid) Nasopharyngeal Swab     Status: None   Collection Time: 08/28/21  9:43 PM   Specimen: Nasopharyngeal Swab; Nasopharyngeal(NP) swabs in vial transport medium  Result Value Ref Range Status   SARS Coronavirus 2 by RT PCR NEGATIVE NEGATIVE Final    Comment: (NOTE) SARS-CoV-2 target nucleic acids are NOT DETECTED.  The SARS-CoV-2 RNA is generally detectable in upper respiratory specimens during the acute phase  of infection. The lowest concentration of SARS-CoV-2 viral copies this assay can detect is 138 copies/mL. A negative result does not preclude SARS-Cov-2 infection and should not be used as the sole basis for treatment or other patient management decisions. A negative result may occur with  improper specimen collection/handling, submission of specimen other than nasopharyngeal swab, presence of viral mutation(s) within the areas targeted by this assay, and inadequate number of viral copies(<138 copies/mL). A negative result must be combined with clinical observations, patient history, and epidemiological information. The expected result is Negative.  Fact Sheet for Patients:  BloggerCourse.com  Fact Sheet for Healthcare Providers:  SeriousBroker.it  This test is no t yet approved or cleared by the [place] FDA and  has been authorized for detection and/or diagnosis of SARS-CoV-2 by FDA under an Emergency Use Authorization (EUA). This EUA will remain  in effect (meaning this test can be used) for the duration of the COVID-19 declaration under Section 564(b)(1) of the Act, 21 U.S.C.section 360bbb-3(b)(1), unless the authorization is terminated  or revoked sooner.       Influenza A by PCR NEGATIVE NEGATIVE Final   Influenza B by PCR NEGATIVE NEGATIVE Final    Comment: (NOTE) The Xpert Xpress SARS-CoV-2/FLU/RSV plus assay is intended as an aid in the diagnosis of influenza from Nasopharyngeal swab specimens and should not be used as a sole basis for treatment. Nasal washings and aspirates are unacceptable for Xpert Xpress SARS-CoV-2/FLU/RSV testing.  Fact Sheet for Patients: BloggerCourse.com  Fact Sheet for Healthcare Providers: SeriousBroker.it  This test is not yet approved or cleared by the [place] FDA and has been authorized for detection and/or diagnosis of  SARS-CoV-2 by FDA under an Emergency Use Authorization (EUA). This EUA will remain in effect (meaning this test can be used) for the duration of the COVID-19 declaration under Section 564(b)(1) of the Act, 21 U.S.C. section 360bbb-3(b)(1), unless the authorization is terminated or revoked.  Performed at [facility], [address], [place], [place] [address]     Radiology Studies: DG Chest Portable 1 View  Result Date: 08/28/2021 CLINICAL DATA:  Increasing weakness x2 weeks. EXAM: PORTABLE CHEST 1 VIEW COMPARISON:  February 02, 2021 FINDINGS: The cardiac silhouette is moderately enlarged and unchanged in size. Both lungs are clear. Degenerative changes are seen throughout the thoracic spine. IMPRESSION: Stable cardiomegaly without acute or active cardiopulmonary disease. Electronically Signed   By: Aram Candela M.D.   On: 08/28/2021 22:13   ECHOCARDIOGRAM COMPLETE  Result Date: 08/29/2021    ECHOCARDIOGRAM REPORT   Patient Name:   Tyler Ochoa Date of Exam:  08/29/2021 Medical Rec #:  [id number]        Height:       74.0 in Accession #:    [id number]       Weight:       224.0 lb Date of Birth:  [birth year]         BSA:          2.281 m Patient Age:    70 years         BP:           130/93 mmHg Patient Gender: M                HR:           101 bpm. Exam Location:  [facility] Procedure: 2D Echo and Intracardiac Opacification Agent Indications:     NSTEMI  History:         Patient has prior history of Echocardiogram examinations. CHF,                  Signs/Symptoms:Edema; Risk Factors:Hypertension, Dyslipidemia                  and CKD.  Sonographer:     L Thornton-Maynard Referring Phys:  [id number] YHC A MANSY Diagnosing Phys: Sena Slate IMPRESSIONS  1. Left ventricular ejection fraction, by estimation, is <20%. The left ventricle has severely decreased function. The left ventricle demonstrates global hypokinesis. There is mild concentric left ventricular hypertrophy. Left ventricular  diastolic parameters are consistent with Grade III diastolic dysfunction (restrictive).  2. Right ventricular systolic function is moderately reduced. The right ventricular size is mildly enlarged.  3. Left atrial size was mildly dilated.  4. Right atrial size was moderately dilated.  5. The mitral valve is normal in structure. Mild mitral valve regurgitation.  6. The aortic valve is normal in structure. Aortic valve regurgitation is not visualized. No aortic stenosis is present.  7. The inferior vena cava is dilated in size with <50% respiratory variability, suggesting right atrial pressure of 15 mmHg. FINDINGS  Left Ventricle: Left ventricular ejection fraction, by estimation, is <20%. The left ventricle has severely decreased function. The left ventricle demonstrates global hypokinesis. Definity contrast agent was given IV to delineate the left ventricular endocardial borders. The left ventricular internal cavity size was normal in size. There is mild concentric left ventricular hypertrophy. Left ventricular diastolic parameters are consistent with Grade III diastolic dysfunction (restrictive). Right Ventricle: The right ventricular size is mildly enlarged. No increase in right ventricular wall thickness. Right ventricular systolic function is moderately reduced. Left Atrium: Left atrial size was mildly dilated. Right Atrium: Right atrial size was moderately dilated. Pericardium: Trivial pericardial effusion is present. Mitral Valve: The mitral valve is normal in structure. Mild mitral valve regurgitation. Tricuspid Valve: The tricuspid valve is normal in structure. Tricuspid valve regurgitation is mild. Aortic Valve: The aortic valve is normal in structure. Aortic valve regurgitation is not visualized. No aortic stenosis is present. Aortic valve mean gradient measures 3.0 mmHg. Aortic valve peak gradient measures 4.2 mmHg. Aortic valve area, by VTI measures 1.78 cm. Pulmonic Valve: The pulmonic valve was normal  in structure. Pulmonic valve regurgitation is mild. Aorta: The aortic root was not well visualized. Venous: The inferior vena cava is dilated in size with less than 50% respiratory variability, suggesting right atrial pressure of 15 mmHg. IAS/Shunts: The interatrial septum was not assessed.  LEFT VENTRICLE PLAX 2D LVIDd:         4.90 cm  Diastology LVIDs:         4.55 cm      LV e' medial:    3.87 cm/s LV PW:         1.30 cm      LV E/e' medial:  16.2 LV IVS:        1.00 cm      LV e' lateral:   5.93 cm/s LVOT diam:     2.10 cm      LV E/e' lateral: 10.6 LV SV:         25 LV SV Index:   11 LVOT Area:     3.46 cm  LV Volumes (MOD) LV vol d, MOD A2C: 148.0 ml LV vol d, MOD A4C: 144.0 ml LV vol s, MOD A2C: 100.0 ml LV vol s, MOD A4C: 125.0 ml LV SV MOD A2C:     48.0 ml LV SV MOD A4C:     144.0 ml LV SV MOD BP:      33.7 ml RIGHT VENTRICLE RV S prime:     5.18 cm/s TAPSE (M-mode): 0.6 cm LEFT ATRIUM             Index LA diam:        3.50 cm 1.53 cm/m LA Vol (A2C):   75.9 ml 33.28 ml/m LA Vol (A4C):   84.2 ml 36.92 ml/m LA Biplane Vol: 81.3 ml 35.65 ml/m  AORTIC VALVE                    PULMONIC VALVE AV Area (Vmax):    1.98 cm     PV Vmax:          0.69 m/s AV Area (Vmean):   1.76 cm     PV Peak grad:     1.9 mmHg AV Area (VTI):     1.78 cm     PR End Diast Vel: 8.07 msec AV Vmax:           102.00 cm/s AV Vmean:          80.000 cm/s AV VTI:            0.143 m AV Peak Grad:      4.2 mmHg AV Mean Grad:      3.0 mmHg LVOT Vmax:         58.45 cm/s LVOT Vmean:        40.650 cm/s LVOT VTI:          0.073 m LVOT/AV VTI ratio: 0.51  AORTA Ao Root diam: 3.60 cm MV E velocity: 62.60 cm/s  TRICUSPID VALVE                            TR Peak grad:   26.2 mmHg                            TR Vmax:        256.00 cm/s                             SHUNTS                            Systemic VTI:  0.07 m  Systemic Diam: 2.10 cm Sena Slate Electronically signed by Sena Slate Signature Date/Time:  08/29/2021/12:05:56 PM    Final       Boyce Medici. [facility]  If 7PM-7AM, please contact night-coverage [email or web address] 08/29/2021, 4:50 PM

## 2021-08-29 NOTE — ED Notes (Signed)
Sister at bedside, she spoke with DR. Welton Flakes earlier. Pt denies any needs at this time. Denies pain.

## 2021-08-29 NOTE — ED Notes (Signed)
Lying in bed alert and oriented to person/place/time. 2 Family members at bedside. Denies pain, dizziness, or shortness of breath. VS stable. Will be transferring to [id number] and Paulino Rily to be receiving SBAR report . She has no questions.

## 2021-08-29 NOTE — Consult Note (Signed)
Tyler Ochoa is a 70 y.o. male  [id number]  Primary Cardiologist: Adrian Blackwater Reason for Consultation: CHF  HPI: This is a 70 year old African-American male with a history of HFrEF renal insufficiency bedridden noncompliant patient presented to the hospital with shortness of breath according to his sister.  Talking to him he says he just has a right knee for pain and no shortness of breath or chest pain.   Review of Systems: Denies any chest pain or shortness of breath   Past Medical History:  Diagnosis Date   CHF (congestive heart failure) (HCC)    Chronic kidney disease    Hyperlipidemia    Hypertension    Peripheral edema     (Not in a hospital admission)     aspirin EC  81 mg Oral Daily   atorvastatin  80 mg Oral QHS   dapagliflozin propanediol  10 mg Oral QAC breakfast   furosemide  40 mg Intravenous Q12H   metoprolol succinate  50 mg Oral Q breakfast   potassium chloride SA  20 mEq Oral Daily    Infusions:  heparin 1,300 Units/hr (08/28/21 2249)    No Known Allergies  Social History   Socioeconomic History   Marital status: Single    Spouse name: Not on file   Number of children: Not on file   Years of education: Not on file   Highest education level: Not on file  Occupational History   Not on file  Tobacco Use   Smoking status: Never   Smokeless tobacco: Never  Vaping Use   Vaping Use: Never used  Substance and Sexual Activity   Alcohol use: Not on file   Drug use: Not on file   Sexual activity: Not on file  Other Topics Concern   Not on file  Social History Narrative   ** Merged History Encounter **       Social Determinants of Health   Financial Resource Strain: Not on file  Food Insecurity: Not on file  Transportation Needs: Not on file  Physical Activity: Not on file  Stress: Not on file  Social Connections: Not on file  Intimate Partner Violence: Not on file    No family history on file.  PHYSICAL EXAM: Vitals:    08/29/21 0800 08/29/21 1100  BP: 117/83 109/82  Pulse: (!) 101 94  Resp: 16 18  Temp:    SpO2: 92% 91%     Intake/Output Summary (Last 24 hours) at 08/29/2021 1215 Last data filed at 08/29/2021 0559 Gross per 24 hour  Intake --  Output 50 ml  Net -50 ml    General:  Well appearing. No respiratory difficulty HEENT: normal Neck: supple. no JVD. Carotids 2+ bilat; no bruits. No lymphadenopathy or thryomegaly appreciated. Cor: PMI nondisplaced. Regular rate & rhythm. No rubs, gallops or murmurs. Lungs: clear Abdomen: soft, nontender, nondistended. No hepatosplenomegaly. No bruits or masses. Good bowel sounds. Extremities: no cyanosis, clubbing, rash, edema Neuro: alert & oriented x 3, cranial nerves grossly intact. moves all 4 extremities w/o difficulty. Affect pleasant.  ECG: Sinus tachycardia with low voltage nonspecific ST-T changes and poor R wave progression  Results for orders placed or performed during the hospital encounter of 08/28/21 (from the past 24 hour(s))  Comprehensive metabolic panel     Status: Abnormal   Collection Time: 08/28/21  3:06 PM  Result Value Ref Range   Sodium 133 (L) 135 - 145 mmol/L   Potassium 4.9 3.5 - 5.1 mmol/L  Chloride 105 98 - 111 mmol/L   CO2 17 (L) 22 - 32 mmol/L   Glucose, Bld 114 (H) 70 - 99 mg/dL   BUN 60 (H) 8 - 23 mg/dL   Creatinine, Ser 6.75 (H) 0.61 - 1.24 mg/dL   Calcium 8.9 8.9 - 91.6 mg/dL   Total Protein 6.7 6.5 - 8.1 g/dL   Albumin 2.9 (L) 3.5 - 5.0 g/dL   AST 384 (H) 15 - 41 U/L   ALT 106 (H) 0 - 44 U/L   Alkaline Phosphatase 163 (H) 38 - 126 U/L   Total Bilirubin 4.2 (H) 0.3 - 1.2 mg/dL   GFR, Estimated 34 (L) >60 mL/min   Anion gap 11 5 - 15  Brain natriuretic peptide     Status: Abnormal   Collection Time: 08/28/21  3:06 PM  Result Value Ref Range   B Natriuretic Peptide 3,231.8 (H) 0.0 - 100.0 pg/mL  CBC with Differential     Status: Abnormal   Collection Time: 08/28/21  3:06 PM  Result Value Ref Range   WBC  3.8 (L) 4.0 - 10.5 K/uL   RBC 5.48 4.22 - 5.81 MIL/uL   Hemoglobin 16.7 13.0 - 17.0 g/dL   HCT 66.5 99.3 - 57.0 %   MCV 86.1 80.0 - 100.0 fL   MCH 30.5 26.0 - 34.0 pg   MCHC 35.4 30.0 - 36.0 g/dL   RDW 17.7 (H) 93.9 - 03.0 %   Platelets 133 (L) 150 - 400 K/uL   nRBC 0.0 0.0 - 0.2 %   Neutrophils Relative % 70 %   Neutro Abs 2.7 1.7 - 7.7 K/uL   Lymphocytes Relative 15 %   Lymphs Abs 0.6 (L) 0.7 - 4.0 K/uL   Monocytes Relative 13 %   Monocytes Absolute 0.5 0.1 - 1.0 K/uL   Eosinophils Relative 1 %   Eosinophils Absolute 0.0 0.0 - 0.5 K/uL   Basophils Relative 1 %   Basophils Absolute 0.0 0.0 - 0.1 K/uL   Immature Granulocytes 0 %   Abs Immature Granulocytes 0.01 0.00 - 0.07 K/uL  Troponin I (High Sensitivity)     Status: Abnormal   Collection Time: 08/28/21  3:06 PM  Result Value Ref Range   Troponin I (High Sensitivity) 420 (HH) <18 ng/L  Troponin I (High Sensitivity)     Status: Abnormal   Collection Time: 08/28/21  9:20 PM  Result Value Ref Range   Troponin I (High Sensitivity) 327 (HH) <18 ng/L  Resp Panel by RT-PCR (Flu A&B, Covid) Nasopharyngeal Swab     Status: None   Collection Time: 08/28/21  9:43 PM   Specimen: Nasopharyngeal Swab; Nasopharyngeal(NP) swabs in vial transport medium  Result Value Ref Range   SARS Coronavirus 2 by RT PCR NEGATIVE NEGATIVE   Influenza A by PCR NEGATIVE NEGATIVE   Influenza B by PCR NEGATIVE NEGATIVE  Protime-INR     Status: Abnormal   Collection Time: 08/29/21  1:08 AM  Result Value Ref Range   Prothrombin Time 20.7 (H) 11.4 - 15.2 seconds   INR 1.8 (H) 0.8 - 1.2  APTT     Status: Abnormal   Collection Time: 08/29/21  1:08 AM  Result Value Ref Range   aPTT 138 (H) 24 - 36 seconds  Basic metabolic panel     Status: Abnormal   Collection Time: 08/29/21  6:23 AM  Result Value Ref Range   Sodium 136 135 - 145 mmol/L   Potassium 4.5 3.5 - 5.1 mmol/L  Chloride 105 98 - 111 mmol/L   CO2 18 (L) 22 - 32 mmol/L   Glucose, Bld 94 70 -  99 mg/dL   BUN 64 (H) 8 - 23 mg/dL   Creatinine, Ser 5.36 (H) 0.61 - 1.24 mg/dL   Calcium 9.0 8.9 - 64.4 mg/dL   GFR, Estimated 38 (L) >60 mL/min   Anion gap 13 5 - 15  CBC     Status: Abnormal   Collection Time: 08/29/21  6:23 AM  Result Value Ref Range   WBC 4.3 4.0 - 10.5 K/uL   RBC 6.08 (H) 4.22 - 5.81 MIL/uL   Hemoglobin 17.2 (H) 13.0 - 17.0 g/dL   HCT 03.4 (H) 74.2 - 59.5 %   MCV 86.3 80.0 - 100.0 fL   MCH 28.3 26.0 - 34.0 pg   MCHC 32.8 30.0 - 36.0 g/dL   RDW 63.8 (H) 75.6 - 43.3 %   Platelets 135 (L) 150 - 400 K/uL   nRBC 0.0 0.0 - 0.2 %  Heparin level (unfractionated)     Status: None   Collection Time: 08/29/21  6:23 AM  Result Value Ref Range   Heparin Unfractionated 0.37 0.30 - 0.70 IU/mL   DG Chest Portable 1 View  Result Date: 08/28/2021 CLINICAL DATA:  Increasing weakness x2 weeks. EXAM: PORTABLE CHEST 1 VIEW COMPARISON:  February 02, 2021 FINDINGS: The cardiac silhouette is moderately enlarged and unchanged in size. Both lungs are clear. Degenerative changes are seen throughout the thoracic spine. IMPRESSION: Stable cardiomegaly without acute or active cardiopulmonary disease. Electronically Signed   By: Aram Candela M.D.   On: 08/28/2021 22:13   ECHOCARDIOGRAM COMPLETE  Result Date: 08/29/2021    ECHOCARDIOGRAM REPORT   Patient Name:   CAELIN RAYL Date of Exam: 08/29/2021 Medical Rec #:  [id number]        Height:       74.0 in Accession #:    [id number]       Weight:       224.0 lb Date of Birth:  07/31/1951         BSA:          2.281 m Patient Age:    70 years         BP:           130/93 mmHg Patient Gender: M                HR:           101 bpm. Exam Location:  [facility] Procedure: 2D Echo and Intracardiac Opacification Agent Indications:     NSTEMI  History:         Patient has prior history of Echocardiogram examinations. CHF,                  Signs/Symptoms:Edema; Risk Factors:Hypertension, Dyslipidemia                  and CKD.  Sonographer:     L  Thornton-Maynard Referring Phys:  [id number] DUK A MANSY Diagnosing Phys: Sena Slate IMPRESSIONS  1. Left ventricular ejection fraction, by estimation, is <20%. The left ventricle has severely decreased function. The left ventricle demonstrates global hypokinesis. There is mild concentric left ventricular hypertrophy. Left ventricular diastolic parameters are consistent with Grade III diastolic dysfunction (restrictive).  2. Right ventricular systolic function is moderately reduced. The right ventricular size is mildly enlarged.  3. Left atrial size was mildly dilated.  4. Right atrial size was  moderately dilated.  5. The mitral valve is normal in structure. Mild mitral valve regurgitation.  6. The aortic valve is normal in structure. Aortic valve regurgitation is not visualized. No aortic stenosis is present.  7. The inferior vena cava is dilated in size with <50% respiratory variability, suggesting right atrial pressure of 15 mmHg. FINDINGS  Left Ventricle: Left ventricular ejection fraction, by estimation, is <20%. The left ventricle has severely decreased function. The left ventricle demonstrates global hypokinesis. Definity contrast agent was given IV to delineate the left ventricular endocardial borders. The left ventricular internal cavity size was normal in size. There is mild concentric left ventricular hypertrophy. Left ventricular diastolic parameters are consistent with Grade III diastolic dysfunction (restrictive). Right Ventricle: The right ventricular size is mildly enlarged. No increase in right ventricular wall thickness. Right ventricular systolic function is moderately reduced. Left Atrium: Left atrial size was mildly dilated. Right Atrium: Right atrial size was moderately dilated. Pericardium: Trivial pericardial effusion is present. Mitral Valve: The mitral valve is normal in structure. Mild mitral valve regurgitation. Tricuspid Valve: The tricuspid valve is normal in structure. Tricuspid valve  regurgitation is mild. Aortic Valve: The aortic valve is normal in structure. Aortic valve regurgitation is not visualized. No aortic stenosis is present. Aortic valve mean gradient measures 3.0 mmHg. Aortic valve peak gradient measures 4.2 mmHg. Aortic valve area, by VTI measures 1.78 cm. Pulmonic Valve: The pulmonic valve was normal in structure. Pulmonic valve regurgitation is mild. Aorta: The aortic root was not well visualized. Venous: The inferior vena cava is dilated in size with less than 50% respiratory variability, suggesting right atrial pressure of 15 mmHg. IAS/Shunts: The interatrial septum was not assessed.  LEFT VENTRICLE PLAX 2D LVIDd:         4.90 cm      Diastology LVIDs:         4.55 cm      LV e' medial:    3.87 cm/s LV PW:         1.30 cm      LV E/e' medial:  16.2 LV IVS:        1.00 cm      LV e' lateral:   5.93 cm/s LVOT diam:     2.10 cm      LV E/e' lateral: 10.6 LV SV:         25 LV SV Index:   11 LVOT Area:     3.46 cm  LV Volumes (MOD) LV vol d, MOD A2C: 148.0 ml LV vol d, MOD A4C: 144.0 ml LV vol s, MOD A2C: 100.0 ml LV vol s, MOD A4C: 125.0 ml LV SV MOD A2C:     48.0 ml LV SV MOD A4C:     144.0 ml LV SV MOD BP:      33.7 ml RIGHT VENTRICLE RV S prime:     5.18 cm/s TAPSE (M-mode): 0.6 cm LEFT ATRIUM             Index LA diam:        3.50 cm 1.53 cm/m LA Vol (A2C):   75.9 ml 33.28 ml/m LA Vol (A4C):   84.2 ml 36.92 ml/m LA Biplane Vol: 81.3 ml 35.65 ml/m  AORTIC VALVE                    PULMONIC VALVE AV Area (Vmax):    1.98 cm     PV Vmax:  0.69 m/s AV Area (Vmean):   1.76 cm     PV Peak grad:     1.9 mmHg AV Area (VTI):     1.78 cm     PR End Diast Vel: 8.07 msec AV Vmax:           102.00 cm/s AV Vmean:          80.000 cm/s AV VTI:            0.143 m AV Peak Grad:      4.2 mmHg AV Mean Grad:      3.0 mmHg LVOT Vmax:         58.45 cm/s LVOT Vmean:        40.650 cm/s LVOT VTI:          0.073 m LVOT/AV VTI ratio: 0.51  AORTA Ao Root diam: 3.60 cm MV E velocity: 62.60  cm/s  TRICUSPID VALVE                            TR Peak grad:   26.2 mmHg                            TR Vmax:        256.00 cm/s                             SHUNTS                            Systemic VTI:  0.07 m                            Systemic Diam: 2.10 cm Sena Slate Electronically signed by Sena Slate Signature Date/Time: 08/29/2021/12:05:56 PM    Final      ASSESSMENT AND PLAN: Congestive heart failure due to HFrEF apparently says he is taking his Entresto and metoprolol succinate 50 mg and Farxiga along with aspirin and atorvastatin.  Patient has been seeing Clarisa Kindred in CHF clinic and was supposed to be seen in April this year in our office but did not follow-up.  He was scheduled for cardiac testing which she refused and did not follow through.  His ejection fraction used to be 30 to 35% currently is 20%.  Patient apparently is not as short of breath according to him and denies any chest pain.  Elevated troponin in the setting of over 3000 BNP is due to CHF.  No further work-up is necessary at this time including pulmonary evaluation because of creatinine being 2.0.  Discussed this with the family that he needs to follow through with the clinic which she has not in the past.  Ritik Stavola A

## 2021-08-29 NOTE — ED Notes (Signed)
Pt used the urinal with the helped of the wife. Denies any complaints at this time.

## 2021-08-29 NOTE — Consult Note (Signed)
ANTICOAGULATION CONSULT NOTE - Consult  Pharmacy Consult for Heparin gtt Indication: chest pain/ACS  No Known Allergies  Patient Measurements: Height: 6\' 2"  (188 cm) Weight: 101.6 kg (224 lb) IBW/kg (Calculated) : 82.2 Heparin Dosing Weight: 101.6kg   Vital Signs: Temp: 97.5 F (36.4 C) (10/22 0700) Temp Source: Oral (10/22 0700) BP: 112/99 (10/22 0700) Pulse Rate: 95 (10/22 0700)  Labs: Recent Labs    08/28/21 1506 08/28/21 2120 08/29/21 0108 08/29/21 0623  HGB 16.7  --   --  17.2*  HCT 47.2  --   --  52.5*  PLT 133*  --   --  135*  APTT  --   --  138*  --   LABPROT  --   --  20.7*  --   INR  --   --  1.8*  --   HEPARINUNFRC  --   --   --  0.37  CREATININE 2.06*  --   --  1.88*  TROPONINIHS 420* 327*  --   --      Estimated Creatinine Clearance: 47.9 mL/min (A) (by C-G formula based on SCr of 1.88 mg/dL (H)).   Medications:  PTA: ASA 81mg  on initial review, but no AC. Inpatient: Hep gtt  Assessment: 70yo Male w/ h/o CHF, CKD, HLD, HTN presenting with weakness progressing x2wks, SOB, DOE, and c/f volume overload. Troponins 420>327; BNP 3231.8. Pharmacy consulted for mgmt of heparin gtt.  Date Time aPTT/HL Rate/Comment 10/22 0623 0.37   Thera-cont 1300u/hr      Baseline Labs: aPTT - pending INR -  pending Hgb - 16.7 > __ Plts - 133 > __  Goal of Therapy:  Heparin level 0.3-0.7 units/ml Monitor platelets by anticoagulation protocol: Yes   Plan:  Give 4000 units bolus x 1; then start heparin infusion at 1300 units/hr Check anti-Xa level in 6 hours and daily while on heparin Continue to monitor H&H and platelets   10/22 0623 HL=0.37 therapeutic. Will continue current rate of 1300units/hr Check confirmatory HL in 6 hrs CBC per protocol  0624 PharmD Clinical Pharmacist 08/29/2021

## 2021-08-29 NOTE — Evaluation (Signed)
Occupational Therapy Evaluation Patient Details Name: Tyler Ochoa MRN: [id number] DOB: 1951/06/17 Today's Date: 08/29/2021   History of Present Illness Pt is a 70 y/o M with PMH: HTN, HLD, HFrRF, and renal insufficiency who presented to the ED d/t wrosening SOB and weakness with concerns for fluid overload from HF clinic. Pt with h/o noncompliance including not f/u with cardiologist and not getting cardiac testing after last hospitalization in April 2022. Cardiology consult reports that increased troponin is 2/2 CHF.   Clinical Impression   Pt seen for OT evaluation this date in setting of acute hospitalization with CHF exacerbation. Pt reports being MOD I at home with use of crutches occasionally for fxl mobility d/t knee pain. Pt lives with significant other in [facility] with 11 STE. Pt presents this date with pain, decreased fxl activity tolerance, decreased LE ROM, decreased strength and general deconditioning; impacting his ability to safely and efficiently perform ADLs. This date, pt requires SETUP for seated UB ADLs, MOD A for seated LB ADLs, MIN A for ADL transfers with RW, MIN A For fxl mobility (small steps, ~6-7 from bed to [employer]). Pt requesting to stay seated on commode to attempt to have BM. Left with call light in hand and RN notified of pt status. Will continue to follow acutely. Anticipate pt will require STR as he is requiring increased assistance for basic mobility and self care and also has multiple steps to ascend to enter his home.     Recommendations for follow up therapy are one component of a multi-disciplinary discharge planning process, led by the attending physician.  Recommendations may be updated based on patient status, additional functional criteria and insurance authorization.   Follow Up Recommendations  SNF    Equipment Recommendations  3 in 1 bedside commode;Tub/shower seat;Other (comment) (2ww)    Recommendations for Other Services       Precautions /  Restrictions Precautions Precautions: Fall Restrictions Weight Bearing Restrictions: No      Mobility Bed Mobility               General bed mobility comments: not assessed, seated edge of stretcher when OT presents to room    Transfers Overall transfer level: Needs assistance Equipment used: Rolling walker (2 wheeled) Transfers: Sit to/from Stand Sit to Stand: Min assist         General transfer comment: cues for hand placement with RW, gentle encouragement, cues to lean FWD To CTS    Balance Overall balance assessment: Needs assistance   Sitting balance-Leahy Scale: Good       Standing balance-Leahy Scale: Poor Standing balance comment: UE support on RW as well as external support of MIN A                           ADL either performed or assessed with clinical judgement   ADL Overall ADL's : Needs assistance/impaired                                       General ADL Comments: requires SETUP for seated UB ADLs, MOD A for seated LB ADLs, MIN A for ADL transfers with RW, MIN A For fxl mobility (small steps, ~6-7 from bed to [facility])     Vision Patient Visual Report: No change from baseline       Perception     Praxis  Pertinent Vitals/Pain Pain Assessment: Faces Faces Pain Scale: Hurts little more Pain Location: knees Pain Descriptors / Indicators: Aching;Sore Pain Intervention(s): Limited activity within patient's tolerance;Monitored during session     Hand Dominance Right   Extremity/Trunk Assessment Upper Extremity Assessment Upper Extremity Assessment: Generalized weakness   Lower Extremity Assessment Lower Extremity Assessment: Generalized weakness (decreased knee ROM at baseline, impacts LB ADLs.)       Communication Communication Communication: No difficulties   Cognition Arousal/Alertness: Awake/alert Behavior During Therapy: WFL for tasks assessed/performed Overall Cognitive Status: Within Functional  Limits for tasks assessed                                 General Comments: pt is not super conversive and requires increased processing time, but overall is oriented and appropriate with command following.   General Comments       Exercises Other Exercises Other Exercises: OT ed re: role, importance of OOB Activity to prevent atrophy, importance of LE stretching to reduce further loss of ROM/flexibility as it pertains to ADLs.   Shoulder Instructions      Home Living Family/patient expects to be discharged to:: Private residence Living Arrangements: Spouse/significant other (partner) Available Help at Discharge: Family;Available PRN/intermittently Type of Home: House Home Access: Stairs to enter Entrance Stairs-Number of Steps: 11 from basement entrance (their primary entrance), 8STE front Entrance Stairs-Rails: Right Home Layout: One level     Bathroom Shower/Tub: Chief Strategy Officer: Standard     Home Equipment: Crutches;Shower seat - built in          Prior Functioning/Environment Level of Independence: Needs assistance  Gait / Transfers Assistance Needed: reports occasional use of crutches (more frequent use recently) d/t knee pain. ADL's / Homemaking Assistance Needed: Pt reports he can perform basic ADLs with INDEP, does endorse increased assist from significant other when knee pain is elevated, including assist for LB dressing. S/o completes most HH IADLs including cooking/cleaning.            OT Problem List: Decreased strength;Decreased activity tolerance;Decreased knowledge of use of DME or AE;Pain      OT Treatment/Interventions: Self-care/ADL training;Therapeutic exercise;Therapeutic activities    OT Goals(Current goals can be found in the care plan section) Acute Rehab OT Goals Patient Stated Goal: to be able to get up easier OT Goal Formulation: With patient Time For Goal Achievement: 09/12/21 Potential to Achieve Goals:  Good ADL Goals Pt Will Perform Lower Body Dressing: with supervision Pt Will Transfer to Toilet: with supervision;ambulating Pt Will Perform Toileting - Clothing Manipulation and hygiene: with supervision;sit to/from stand Pt Will Perform Tub/Shower Transfer: with supervision;ambulating;shower seat;grab bars;rolling walker Pt/caregiver will Perform Home Exercise Program: Increased strength;Both right and left upper extremity;With Supervision  OT Frequency: Min 1X/week   Barriers to D/C: Inaccessible home environment  8 STE front, 11 STE back       Co-evaluation              AM-PAC OT "6 Clicks" Daily Activity     Outcome Measure Help from another person eating meals?: None Help from another person taking care of personal grooming?: None Help from another person toileting, which includes using toliet, bedpan, or urinal?: A Lot Help from another person bathing (including washing, rinsing, drying)?: A Lot Help from another person to put on and taking off regular upper body clothing?: A Little Help from another person to put on and taking  off regular lower body clothing?: A Lot 6 Click Score: 17   End of Session Equipment Utilized During Treatment: Gait belt;Rolling walker Nurse Communication: Mobility status  Activity Tolerance: Patient tolerated treatment well Patient left: Other (comment);with call bell/phone within reach (seated on [facility] with RW aware that pt is requesting time to sit and attempt to have BM)  OT Visit Diagnosis: Unsteadiness on feet (R26.81);Muscle weakness (generalized) (M62.81);Pain Pain - part of body: Knee (bilateral)                Time: 2585-2778 OT Time Calculation (min): 10 min Charges:  OT General Charges $OT Visit: 1 Visit OT Evaluation $OT Eval Moderate Complexity: 1 [address] [place], OTR/L ascom [phone] 08/29/21, 2:46 PM

## 2021-08-29 NOTE — Consult Note (Signed)
ANTICOAGULATION CONSULT NOTE - Consult  Pharmacy Consult for Heparin gtt Indication: chest pain/ACS  No Known Allergies  Patient Measurements: Height: 6\' 2"  (188 cm) Weight: 101.6 kg (224 lb) IBW/kg (Calculated) : 82.2 Heparin Dosing Weight: 101.6kg   Vital Signs: Temp: 97.5 F (36.4 C) (10/22 0700) Temp Source: Oral (10/22 0700) BP: 108/81 (10/22 1200) Pulse Rate: 85 (10/22 1200)  Labs: Recent Labs    08/28/21 1506 08/28/21 2120 08/29/21 0108 08/29/21 0623 08/29/21 1231  HGB 16.7  --   --  17.2*  --   HCT 47.2  --   --  52.5*  --   PLT 133*  --   --  135*  --   APTT  --   --  138*  --   --   LABPROT  --   --  20.7*  --   --   INR  --   --  1.8*  --   --   HEPARINUNFRC  --   --   --  0.37 0.25*  CREATININE 2.06*  --   --  1.88*  --   TROPONINIHS 420* 327*  --   --   --      Estimated Creatinine Clearance: 47.9 mL/min (A) (by C-G formula based on SCr of 1.88 mg/dL (H)).   Medications:  PTA: ASA 81mg  on initial review, but no AC. Inpatient: Hep gtt  Assessment: 70yo Male w/ h/o CHF, CKD, HLD, HTN presenting with weakness progressing x2wks, SOB, DOE, and c/f volume overload. Troponins 420>327; BNP 3231.8. Pharmacy consulted for mgmt of heparin gtt.  Date Time aPTT/HL Rate/Comment 10/22 0623 0.37   Thera-cont 1300u/hr  10/22 1231 0.25   Subthera, bolus 1500 u, inc rate to 1500 u/hr      Baseline Labs: aPTT - pending INR -  pending Hgb - 16.7 > __ Plts - 133 > __  Goal of Therapy:  Heparin level 0.3-0.7 units/ml Monitor platelets by anticoagulation protocol: Yes   Plan:  10/22 1231 HL=0.25   SUBtherapeutic. Will order bolus of 1500 units x 1 and increase heparin rate to 1500 units/hr reCheck HL in 6 hrs CBC per protocol  11/22 PharmD Clinical Pharmacist 08/29/2021

## 2021-08-29 NOTE — ED Notes (Signed)
Assisted pt to use urinal, sit up on the side of the bed.

## 2021-08-30 DIAGNOSIS — Z7189 Other specified counseling: Secondary | ICD-10-CM

## 2021-08-30 DIAGNOSIS — I5043 Acute on chronic combined systolic (congestive) and diastolic (congestive) heart failure: Secondary | ICD-10-CM | POA: Diagnosis not present

## 2021-08-30 DIAGNOSIS — L8990 Pressure ulcer of unspecified site, unspecified stage: Secondary | ICD-10-CM | POA: Insufficient documentation

## 2021-08-30 DIAGNOSIS — M7989 Other specified soft tissue disorders: Secondary | ICD-10-CM | POA: Diagnosis not present

## 2021-08-30 DIAGNOSIS — N179 Acute kidney failure, unspecified: Secondary | ICD-10-CM | POA: Diagnosis not present

## 2021-08-30 DIAGNOSIS — R262 Difficulty in walking, not elsewhere classified: Secondary | ICD-10-CM | POA: Diagnosis not present

## 2021-08-30 LAB — HEPATIC FUNCTION PANEL
ALT: 74 U/L — ABNORMAL HIGH (ref 0–44)
AST: 72 U/L — ABNORMAL HIGH (ref 15–41)
Albumin: 2.8 g/dL — ABNORMAL LOW (ref 3.5–5.0)
Alkaline Phosphatase: 143 U/L — ABNORMAL HIGH (ref 38–126)
Bilirubin, Direct: 2.4 mg/dL — ABNORMAL HIGH (ref 0.0–0.2)
Indirect Bilirubin: 1.9 mg/dL — ABNORMAL HIGH (ref 0.3–0.9)
Total Bilirubin: 4.3 mg/dL — ABNORMAL HIGH (ref 0.3–1.2)
Total Protein: 6.4 g/dL — ABNORMAL LOW (ref 6.5–8.1)

## 2021-08-30 LAB — CBC
HCT: 46.4 % (ref 39.0–52.0)
Hemoglobin: 16 g/dL (ref 13.0–17.0)
MCH: 28.9 pg (ref 26.0–34.0)
MCHC: 34.5 g/dL (ref 30.0–36.0)
MCV: 83.8 fL (ref 80.0–100.0)
Platelets: 139 10*3/uL — ABNORMAL LOW (ref 150–400)
RBC: 5.54 MIL/uL (ref 4.22–5.81)
RDW: 17.2 % — ABNORMAL HIGH (ref 11.5–15.5)
WBC: 3.5 10*3/uL — ABNORMAL LOW (ref 4.0–10.5)
nRBC: 0 % (ref 0.0–0.2)

## 2021-08-30 LAB — RENAL FUNCTION PANEL
Albumin: 2.7 g/dL — ABNORMAL LOW (ref 3.5–5.0)
Anion gap: 9 (ref 5–15)
BUN: 65 mg/dL — ABNORMAL HIGH (ref 8–23)
CO2: 18 mmol/L — ABNORMAL LOW (ref 22–32)
Calcium: 8.4 mg/dL — ABNORMAL LOW (ref 8.9–10.3)
Chloride: 108 mmol/L (ref 98–111)
Creatinine, Ser: 1.9 mg/dL — ABNORMAL HIGH (ref 0.61–1.24)
GFR, Estimated: 38 mL/min — ABNORMAL LOW (ref >60)
Glucose, Bld: 90 mg/dL (ref 70–99)
Phosphorus: 4.1 mg/dL (ref 2.5–4.6)
Potassium: 4.2 mmol/L (ref 3.5–5.1)
Sodium: 135 mmol/L (ref 135–145)

## 2021-08-30 LAB — CK: Total CK: 173 U/L (ref 49–397)

## 2021-08-30 LAB — BRAIN NATRIURETIC PEPTIDE: B Natriuretic Peptide: 4339.5 pg/mL — ABNORMAL HIGH (ref 0.0–100.0)

## 2021-08-30 NOTE — Evaluation (Addendum)
Physical Therapy Evaluation Patient Details Name: Tyler Ochoa MRN: [id number] DOB: [birth year] Today's Date: 08/30/2021  History of Present Illness  Pt is a 70 y/o M with PMH: HTN, HLD, HFrRF, and renal insufficiency who presented to the ED d/t wrosening SOB and weakness with concerns for fluid overload from HF clinic. Pt with h/o noncompliance including not f/u with cardiologist and not getting cardiac testing after last hospitalization in April 2022. Cardiology consult reports that increased troponin is 2/2 CHF.  Clinical Impression  Pt seen for PT evaluation with pt reporting a decline in functional mobility since "his brother died". Pt also endorse chronic BLE knee pain that limits mobility. On this date, pt is able to complete transfers & short distance gait in room (~8 ft) with RW & CGA. Pt maintains BLE knees & hip flexion during standing/gait. Pt is eager to d/c to rehab & would benefit from STR stay to increase independence with gait & stair negotiation as there are concerns re: pt returning home at this time with 8-11 steps to enter his home. Will continue to follow pt acutely to progress mobility as able.        Recommendations for follow up therapy are one component of a multi-disciplinary discharge planning process, led by the attending physician.  Recommendations may be updated based on patient status, additional functional criteria and insurance authorization.  Follow Up Recommendations SNF    Equipment Recommendations  None recommended by PT    Recommendations for Other Services       Precautions / Restrictions Precautions Precautions: Fall Restrictions Weight Bearing Restrictions: No      Mobility  Bed Mobility               General bed mobility comments: not observed, pt received & left sitting on EOB    Transfers Overall transfer level: Needs assistance Equipment used: Rolling walker (2 wheeled) Transfers: Sit to/from Stand Sit to Stand: Min assist          General transfer comment: sit<>stand from significantly elevated EOB (prefers not to sit in recliner 2/2 low seat height)  Ambulation/Gait Ambulation/Gait assistance: Min guard Gait Distance (Feet): 8 Feet Assistive device: Rolling walker (2 wheeled) Gait Pattern/deviations: Decreased step length - left;Decreased step length - right;Decreased stride length Gait velocity: decreased   General Gait Details: Pt maintains BLE hip & knee flexion during standing/gait, reports he has to sit soon as his knees will begin hurting more the longer he stands.  Stairs            Wheelchair Mobility    Modified Rankin (Stroke Patients Only)       Balance Overall balance assessment: Needs assistance Sitting-balance support: Feet supported;No upper extremity supported Sitting balance-Leahy Scale: Good     Standing balance support: Bilateral upper extremity supported;During functional activity Standing balance-Leahy Scale: Poor Standing balance comment: requires UE support on RW                             Pertinent Vitals/Pain Pain Assessment: 0-10 Pain Score: 7  Pain Location: BLE knees, increases with mobility Pain Descriptors / Indicators: Aching;Sore Pain Intervention(s): Repositioned;Monitored during session;Limited activity within patient's tolerance    Home Living Family/patient expects to be discharged to:: Private residence Living Arrangements: Spouse/significant other (girlfriend) Available Help at Discharge: Family;Available PRN/intermittently Type of Home: House Home Access: Stairs to enter Entrance Stairs-Rails: Right Entrance Stairs-Number of Steps: 11 from basement entrance (their primary  entrance), 8STE front Home Layout: One level Home Equipment: Crutches;Shower seat - built in      Prior Presenter, broadcasting / Transfers Assistance Needed: Pt reports he uses crutches for mobility 2/2 chronic BLE knee pain. Reports he's been going out of the  house less & less "since my brother died".           Hand Dominance        Extremity/Trunk Assessment   Upper Extremity Assessment Upper Extremity Assessment: Generalized weakness    Lower Extremity Assessment Lower Extremity Assessment: Generalized weakness (BLE knees & hips maintain flexion during standing/gait)       Communication   Communication: No difficulties  Cognition Arousal/Alertness: Awake/alert Behavior During Therapy: WFL for tasks assessed/performed;Flat affect Overall Cognitive Status: Within Functional Limits for tasks assessed                                        General Comments General comments (skin integrity, edema, etc.): HR 92 bpm after gait in room    Exercises     Assessment/Plan    PT Assessment Patient needs continued PT services  PT Problem List Decreased strength;Decreased mobility;Decreased knowledge of precautions;Decreased activity tolerance;Cardiopulmonary status limiting activity;Decreased balance;Decreased knowledge of use of DME;Pain       PT Treatment Interventions DME instruction;Therapeutic activities;Modalities;Gait training;Therapeutic exercise;Patient/family education;Stair training;Balance training;Manual techniques;Functional mobility training;Neuromuscular re-education    PT Goals (Current goals can be found in the Care Plan section)  Acute Rehab PT Goals Patient Stated Goal: decreased knee pain, go to rehab PT Goal Formulation: With patient/family Time For Goal Achievement: 09/13/21 Potential to Achieve Goals: Good    Frequency Min 2X/week   Barriers to discharge Decreased caregiver support;Inaccessible home environment      Co-evaluation               AM-PAC PT "6 Clicks" Mobility  Outcome Measure Help needed turning from your back to your side while in a flat bed without using bedrails?: A Little Help needed moving from lying on your back to sitting on the side of a flat bed without  using bedrails?: A Little Help needed moving to and from a bed to a chair (including a wheelchair)?: A Little Help needed standing up from a chair using your arms (e.g., wheelchair or bedside chair)?: A Little Help needed to walk in hospital room?: A Little Help needed climbing 3-5 steps with a railing? : A Lot 6 Click Score: 17    End of Session   Activity Tolerance: Patient tolerated treatment well Patient left: in bed;with call bell/phone within reach;with family/visitor present;with bed alarm set Nurse Communication: Mobility status PT Visit Diagnosis: Difficulty in walking, not elsewhere classified (R26.2);Pain;Muscle weakness (generalized) (M62.81) Pain - Right/Left:  (bilateral) Pain - part of body: Knee    Time: 1517-6160 PT Time Calculation (min) (ACUTE ONLY): 10 min   Charges:   PT Evaluation $PT Eval Low Complexity: 1 Low          Aleda Grana, PT, DPT 08/30/21, 2:39 PM   Sandi Mariscal 08/30/2021, 2:37 PM

## 2021-08-30 NOTE — Progress Notes (Signed)
PROGRESS NOTE  Tyler Ochoa GQQ:[id number] DOB: 30-Jan-1951   PCP: Queen Slough, MD  Patient is from:.  [place] rolling walker and cane.  Lives with his girlfriend.  DOA: 08/28/2021 LOS: 2  Chief complaints:  Chief Complaint  Patient presents with   Weakness   Ascites     Brief Narrative / Interim history: 70 year old male with PMH of systolic CHF, chronic edema, osteoarthritis, debility, HTN and HLD presenting with generalized weakness, dry cough, DOE, BLE edema and orthopnea, and admitted with acute on chronic systolic CHF, possible non-STEMI and AKI.  BNP elevated to 3200.  Troponin elevated to 420 and improved to 327.  Started on IV Lasix and IV heparin.  Cardiology consulted.  TTE with LVEF <20% (25 to 30% in 01/2021), G3 DD and moderate RAE.  No further cardiac or pulmonary evaluation per cardiologist.  Therapy recommended SNF.  Subjective: Seen and examined earlier this morning.  No major events overnight of this morning.  No complaints.  He denies chest pain, dyspnea, GI or UTI symptoms.  Objective: Vitals:   08/30/21 0016 08/30/21 0316 08/30/21 0726 08/30/21 1131  BP: 130/81 114/76 111/80 105/81  Pulse: 91 78 78 79  Resp: 20 20 18 18   Temp: 97.9 F (36.6 C) 97.6 F (36.4 C) 97.8 F (36.6 C) 97.9 F (36.6 C)  TempSrc:      SpO2: 96% 97% 96% 97%  Weight:      Height:        Intake/Output Summary (Last 24 hours) at 08/30/2021 1216 Last data filed at 08/30/2021 1130 Gross per 24 hour  Intake --  Output 1000 ml  Net -1000 ml   Filed Weights   08/28/21 1453 08/29/21 2031  Weight: 101.6 kg 100.8 kg    Examination:  GENERAL: No apparent distress.  Nontoxic. HEENT: MMM.  Vision and hearing grossly intact.  NECK: Supple.  No apparent JVD.  RESP: 97% on RA at rest.  No IWOB.  Fair aeration bilaterally. CVS:  RRR. Heart sounds normal.  ABD/GI/GU: BS+. Abd soft, NTND.  MSK/EXT:  Moves extremities. No apparent deformity.  1+ BLE edema, right> left SKIN: no  apparent skin lesion or wound NEURO: Awake and alert. Oriented appropriately.  No apparent focal neuro deficit. PSYCH: Calm. Normal affect.   Procedures:  None  Microbiology summarized: COVID-19 and influenza PCR nonreactive.  Assessment & Plan: Acute on chronic combined CHF: TTE with LVEF of <20% (25 to 30% in 01/2021), G3 DD and moderate RAE.  Reportedly had generalized weakness, DOE, 3 pillow orthopnea and BLE edema on presentation although  patient denies significant acute change.  BNP 3200>> 4400.  CXR with stable cardiomegaly.  Elevated troponin likely from demand ischemia as well. -No further cardiac evaluation from cardiology standpoint. -Continue IV Lasix 40 mg twice daily while in-house -GDMT-Entresto, Toprol-XL and Farxiga -Monitor fluid status, renal functions and electrolytes. -Sodium and fluid restriction. -Palliative medicine consulted for goal of care discussion.  Elevated troponin without significant delta: Likely demand ischemia.  No RWMA on TTE. -Continue statin, aspirin and beta-blocker  AKI/azotemia: Cardiorenal?  Improving with IV diuretics. Recent Labs    02/06/21 0639 02/07/21 0354 02/08/21 04/10/21 02/09/21 0533 03/07/21 1458 03/10/21 1227 05/05/21 1418 08/28/21 1506 08/29/21 0623 08/30/21 0448  BUN 13 11 11 12  31* 24* 20 60* 64* 65*  CREATININE 1.52* 1.34* 1.35* 1.50* 1.63* 1.38* 1.10 2.06* 1.88* 1.90*  -Continue monitoring -Consider further work-up if worse or no improvement.  Elevated liver enzymes/hyperbilirubinemia/coagulopathy: CK within normal.  Denies EtOH.  Could be due to CHF and/or statin. Recent Labs  Lab 08/28/21 1506 08/30/21 0448  AST 136* 72*  ALT 106* 74*  ALKPHOS 163* 143*  BILITOT 4.2* 4.3*  PROT 6.7 6.4*  ALBUMIN 2.9* 2.7*  2.8*  -Continue monitoring -Consider holding statin if no improvement  Polycythemia: Hemoconcentration?  No history of respiratory failure. Recent Labs    02/02/21 1359 02/03/21 0438 02/04/21 0558  02/08/21 0632 02/09/21 0533 03/07/21 1410 08/28/21 1506 08/29/21 0623 08/30/21 0448  HGB 14.3 14.4 13.7 14.1 13.0 16.1 16.7 17.2* 16.0  -Recheck in the morning.  Essential hypertension: Normotensive. -Cardiac meds as above  Hyperlipidemia:  -Continue statin.  Ambulatory dysfunction/knee osteoarthritis -Therapy recommended SNF.  BLE edema, Rt>Lt -Lower extremity Doppler to exclude DVT  Thrombocytopenia: Stable Recent Labs  Lab 08/28/21 1506 08/29/21 0623 08/30/21 0448  PLT 133* 135* 139*  -Continue monitoring  Goal of care counseling: Patient with advanced CHF and other comorbidity as above.  Poor long-term prognosis.  Hospice candidate.  However, patient still full code and prefers to remain full code despite risk and benefit discussion.  -Palliative medicine consulted.  Body mass index is 28.53 kg/m.       Pressure Injury 08/29/21 Buttocks Medial Stage 2 -  Partial thickness loss of dermis presenting as a shallow open injury with a red, pink wound bed without slough. (Active)  08/29/21 2109  Location: Buttocks  Location Orientation: Medial  Staging: Stage 2 -  Partial thickness loss of dermis presenting as a shallow open injury with a red, pink wound bed without slough.  Wound Description (Comments):   Present on Admission: Yes   DVT prophylaxis:  heparin injection 5,000 Units Start: 08/29/21 2200  Code Status: Full code Family Communication: Patient and/or RN. Available if any question.  Level of care: Progressive Cardiac Status is: Inpatient  Remains inpatient appropriate because: Safe disposition/SNF       Consultants:  Cardiology   Sch Meds:  Scheduled Meds:  aspirin EC  81 mg Oral Daily   atorvastatin  80 mg Oral QHS   dapagliflozin propanediol  10 mg Oral QAC breakfast   furosemide  40 mg Intravenous Q12H   heparin injection (subcutaneous)  5,000 Units Subcutaneous Q8H   metoprolol succinate  50 mg Oral Q breakfast   potassium chloride  SA  20 mEq Oral Daily   Continuous Infusions:   PRN Meds:.acetaminophen **OR** acetaminophen, albuterol, magnesium hydroxide, ondansetron **OR** ondansetron (ZOFRAN) IV, traZODone  Antimicrobials: Anti-infectives (From admission, onward)    None        I have personally reviewed the following labs and images: CBC: Recent Labs  Lab 08/28/21 1506 08/29/21 0623 08/30/21 0448  WBC 3.8* 4.3 3.5*  NEUTROABS 2.7  --   --   HGB 16.7 17.2* 16.0  HCT 47.2 52.5* 46.4  MCV 86.1 86.3 83.8  PLT 133* 135* 139*   BMP &GFR Recent Labs  Lab 08/28/21 1506 08/29/21 0623 08/30/21 0448  NA 133* 136 135  K 4.9 4.5 4.2  CL 105 105 108  CO2 17* 18* 18*  GLUCOSE 114* 94 90  BUN 60* 64* 65*  CREATININE 2.06* 1.88* 1.90*  CALCIUM 8.9 9.0 8.4*  PHOS  --   --  4.1   Estimated Creatinine Clearance: 47.2 mL/min (A) (by C-G formula based on SCr of 1.9 mg/dL (H)). Liver & Pancreas: Recent Labs  Lab 08/28/21 1506 08/30/21 0448  AST 136* 72*  ALT 106* 74*  ALKPHOS 163* 143*  BILITOT 4.2* 4.3*  PROT 6.7 6.4*  ALBUMIN 2.9* 2.7*  2.8*   No results for input(s): LIPASE, AMYLASE in the last 168 hours. No results for input(s): AMMONIA in the last 168 hours. Diabetic: No results for input(s): HGBA1C in the last 72 hours. No results for input(s): GLUCAP in the last 168 hours. Cardiac Enzymes: Recent Labs  Lab 08/30/21 0448  CKTOTAL 173   No results for input(s): PROBNP in the last 8760 hours. Coagulation Profile: Recent Labs  Lab 08/29/21 0108  INR 1.8*   Thyroid Function Tests: No results for input(s): TSH, T4TOTAL, FREET4, T3FREE, THYROIDAB in the last 72 hours. Lipid Profile: No results for input(s): CHOL, HDL, LDLCALC, TRIG, CHOLHDL, LDLDIRECT in the last 72 hours. Anemia Panel: No results for input(s): VITAMINB12, FOLATE, FERRITIN, TIBC, IRON, RETICCTPCT in the last 72 hours. Urine analysis:    Component Value Date/Time   COLORURINE YELLOW (A) 03/07/2021 1705    APPEARANCEUR CLEAR (A) 03/07/2021 1705   LABSPEC 1.010 03/07/2021 1705   PHURINE 5.0 03/07/2021 1705   GLUCOSEU >=500 (A) 03/07/2021 1705   HGBUR SMALL (A) 03/07/2021 1705   BILIRUBINUR NEGATIVE 03/07/2021 1705   KETONESUR NEGATIVE 03/07/2021 1705   PROTEINUR 30 (A) 03/07/2021 1705   NITRITE NEGATIVE 03/07/2021 1705   LEUKOCYTESUR NEGATIVE 03/07/2021 1705   Sepsis Labs: Invalid input(s): PROCALCITONIN, LACTICIDVEN  Microbiology: Recent Results (from the past 240 hour(s))  Resp Panel by RT-PCR (Flu A&B, Covid) Nasopharyngeal Swab     Status: None   Collection Time: 08/28/21  9:43 PM   Specimen: Nasopharyngeal Swab; Nasopharyngeal(NP) swabs in vial transport medium  Result Value Ref Range Status   SARS Coronavirus 2 by RT PCR NEGATIVE NEGATIVE Final    Comment: (NOTE) SARS-CoV-2 target nucleic acids are NOT DETECTED.  The SARS-CoV-2 RNA is generally detectable in upper respiratory specimens during the acute phase of infection. The lowest concentration of SARS-CoV-2 viral copies this assay can detect is 138 copies/mL. A negative result does not preclude SARS-Cov-2 infection and should not be used as the sole basis for treatment or other patient management decisions. A negative result may occur with  improper specimen collection/handling, submission of specimen other than nasopharyngeal swab, presence of viral mutation(s) within the areas targeted by this assay, and inadequate number of viral copies(<138 copies/mL). A negative result must be combined with clinical observations, patient history, and epidemiological information. The expected result is Negative.  Fact Sheet for Patients:  BloggerCourse.com  Fact Sheet for Healthcare Providers:  SeriousBroker.it  This test is no t yet approved or cleared by the [place] FDA and  has been authorized for detection and/or diagnosis of SARS-CoV-2 by FDA under an Emergency Use  Authorization (EUA). This EUA will remain  in effect (meaning this test can be used) for the duration of the COVID-19 declaration under Section 564(b)(1) of the Act, 21 U.S.C.section 360bbb-3(b)(1), unless the authorization is terminated  or revoked sooner.       Influenza A by PCR NEGATIVE NEGATIVE Final   Influenza B by PCR NEGATIVE NEGATIVE Final    Comment: (NOTE) The Xpert Xpress SARS-CoV-2/FLU/RSV plus assay is intended as an aid in the diagnosis of influenza from Nasopharyngeal swab specimens and should not be used as a sole basis for treatment. Nasal washings and aspirates are unacceptable for Xpert Xpress SARS-CoV-2/FLU/RSV testing.  Fact Sheet for Patients: BloggerCourse.com  Fact Sheet for Healthcare Providers: SeriousBroker.it  This test is not yet approved or cleared by the [place] FDA and has  been authorized for detection and/or diagnosis of SARS-CoV-2 by FDA under an Emergency Use Authorization (EUA). This EUA will remain in effect (meaning this test can be used) for the duration of the COVID-19 declaration under Section 564(b)(1) of the Act, 21 U.S.C. section 360bbb-3(b)(1), unless the authorization is terminated or revoked.  Performed at [facility], [address] [place], [place] [address]     Radiology Studies: US Venous Img Lower Bilateral (DVT)  Result Date: 08/29/2021 CLINICAL DATA:  Extremity swelling EXAM: BILATERAL LOWER EXTREMITY VENOUS DOPPLER ULTRASOUND TECHNIQUE: Gray-scale sonography with compression, as well as color and duplex ultrasound, were performed to evaluate the deep venous system(s) from the level of the common femoral vein through the popliteal and proximal calf veins. COMPARISON:  None. FINDINGS: VENOUS Normal compressibility of the common femoral, superficial femoral, and popliteal veins, as well as the visualized calf veins. Visualized portions of profunda femoral  vein and great saphenous vein unremarkable. No filling defects to suggest DVT on grayscale or color Doppler imaging. Doppler waveforms show normal direction of venous flow, normal respiratory plasticity and response to augmentation. Limited views of the contralateral common femoral vein are unremarkable. OTHER Soft tissue edema bilaterally. Limitations: none IMPRESSION: Negative. Electronically Signed   By: Meda Klinefelter M.D.   On: 08/29/2021 18:37      Carleah Yablonski T. Tanyika Barros [facility]  If 7PM-7AM, please contact night-coverage [email or web address] 08/30/2021, 12:16 PM

## 2021-08-30 NOTE — Progress Notes (Signed)
Chaplain Maggie visited with pt at bedside. The visit centered around storytelling, listening, scripture reading (Luke 6 beatitudes) and prayer. Pt shared grief over recent death of his brother. He relies on prayer to give him strength and encouragement to know God provides for him in his need. Pt appreciated visit and is open to continued spiritual care. Chaplain available per on call chaplain.

## 2021-08-31 LAB — COMPREHENSIVE METABOLIC PANEL
ALT: 60 U/L — ABNORMAL HIGH (ref 0–44)
AST: 62 U/L — ABNORMAL HIGH (ref 15–41)
Albumin: 2.8 g/dL — ABNORMAL LOW (ref 3.5–5.0)
Alkaline Phosphatase: 133 U/L — ABNORMAL HIGH (ref 38–126)
Anion gap: 11 (ref 5–15)
BUN: 60 mg/dL — ABNORMAL HIGH (ref 8–23)
CO2: 18 mmol/L — ABNORMAL LOW (ref 22–32)
Calcium: 8.3 mg/dL — ABNORMAL LOW (ref 8.9–10.3)
Chloride: 105 mmol/L (ref 98–111)
Creatinine, Ser: 1.71 mg/dL — ABNORMAL HIGH (ref 0.61–1.24)
GFR, Estimated: 43 mL/min — ABNORMAL LOW (ref >60)
Glucose, Bld: 120 mg/dL — ABNORMAL HIGH (ref 70–99)
Potassium: 4.9 mmol/L (ref 3.5–5.1)
Sodium: 134 mmol/L — ABNORMAL LOW (ref 135–145)
Total Bilirubin: 4.4 mg/dL — ABNORMAL HIGH (ref 0.3–1.2)
Total Protein: 6.3 g/dL — ABNORMAL LOW (ref 6.5–8.1)

## 2021-08-31 MED ORDER — FUROSEMIDE 10 MG/ML IJ SOLN
40.0000 mg | Freq: Every day | INTRAMUSCULAR | Status: DC
  Administered 2021-09-01: 40 mg via INTRAVENOUS
  Filled 2021-08-31: qty 4

## 2021-08-31 NOTE — NC FL2 (Signed)
[place] MEDICAID FL2 LEVEL OF CARE SCREENING TOOL     IDENTIFICATION  Patient Name: Tyler Ochoa Birthdate: [birth year] Sex: male Admission Date (Current Location): 08/28/2021  [facility] and [place] Number:  Chiropodist and Address:  [facility], [address] [place], [place] [address]      Provider Number: [id number]  Attending Physician Name and Address:  Kathrynn Running, MD  Relative Name and Phone Number:       Current Level of Care: Hospital Recommended Level of Care: Skilled Nursing Facility Prior Approval Number:    Date Approved/Denied:   PASRR Number: [id number]  Discharge Plan: SNF    Current Diagnoses: Patient Active Problem List   Diagnosis Date Noted   Pressure injury of skin 08/30/2021   NSTEMI (non-ST elevated myocardial infarction) (HCC) 08/28/2021   Acute exacerbation of CHF (congestive heart failure) (HCC) 02/02/2021   Essential hypertension 02/02/2021   Hyperlipidemia 02/02/2021   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   Osteoarthritis of both knees 02/02/2021    Orientation RESPIRATION BLADDER Height & Weight     Self, Time, Situation, Place  Normal Continent Weight: 222 lb 10.6 oz (101 kg) Height:  6\' 2"  (188 cm)  BEHAVIORAL SYMPTOMS/MOOD NEUROLOGICAL BOWEL NUTRITION STATUS      Continent Diet (heart healthy, thin liquids)  AMBULATORY STATUS COMMUNICATION OF NEEDS Skin   Limited Assist Verbally PU Stage and Appropriate Care   PU Stage 2 Dressing:  (buttocks, foam)                   Personal Care Assistance Level of Assistance  Dressing, Feeding, Bathing Bathing Assistance: Limited assistance Feeding assistance: Independent Dressing Assistance: Limited assistance     Functional Limitations Info  Sight, Hearing, Speech Sight Info: Adequate Hearing Info: Adequate Speech Info: Adequate    SPECIAL CARE FACTORS FREQUENCY  PT (By licensed PT), OT (By licensed OT)     PT Frequency: 5x OT Frequency:  5x            Contractures Contractures Info: Not present    Additional Factors Info  Code Status, Allergies Code Status Info: full code Allergies Info: no known allergies           Current Medications (08/31/2021):  This is the current hospital active medication list Current Facility-Administered Medications  Medication Dose Route Frequency Provider Last Rate Last Admin   acetaminophen (TYLENOL) tablet 650 mg  650 mg Oral Q6H PRN Mansy, Jan A, MD       Or   acetaminophen (TYLENOL) suppository 650 mg  650 mg Rectal Q6H PRN Mansy, Jan A, MD       albuterol (PROVENTIL) (2.5 MG/3ML) 0.083% nebulizer solution 2.5 mg  2.5 mg Nebulization Q4H PRN Feb, RPH       aspirin EC tablet 81 mg  81 mg Oral Daily Mansy, Jan A, MD   81 mg at 08/31/21 0811   atorvastatin (LIPITOR) tablet 80 mg  80 mg Oral QHS Mansy, Jan A, MD   80 mg at 08/30/21 2111   dapagliflozin propanediol (FARXIGA) tablet 10 mg  10 mg Oral QAC breakfast Mansy, Jan A, MD   10 mg at 08/31/21 0812   [START ON 09/01/2021] furosemide (LASIX) injection 40 mg  40 mg Intravenous Daily Wouk, 09/03/2021, MD       heparin injection 5,000 Units  5,000 Units Subcutaneous Q8H Wilfred Curtis, MD   5,000 Units at 08/31/21 0539   magnesium hydroxide (MILK  OF MAGNESIA) suspension 30 mL  30 mL Oral Daily PRN Mansy, Jan A, MD       metoprolol succinate (TOPROL-XL) 24 hr tablet 50 mg  50 mg Oral Q breakfast Mansy, Jan A, MD   50 mg at 08/31/21 0812   ondansetron [facility]) tablet 4 mg  4 mg Oral Q6H PRN Mansy, Jan A, MD       Or   ondansetron [facility]) injection 4 mg  4 mg Intravenous Q6H PRN Mansy, Jan A, MD       potassium chloride SA (KLOR-CON) CR tablet 20 mEq  20 mEq Oral Daily Mansy, Jan A, MD   20 mEq at 08/31/21 4627   traZODone (DESYREL) tablet 25 mg  25 mg Oral QHS PRN Mansy, Vernetta Honey, MD         Discharge Medications: Please see discharge summary for a list of discharge medications.  Relevant Imaging Results:  Relevant  Lab Results:   Additional Information SSN:[id number]  Reuel Boom Ardath Lepak, LCSW

## 2021-08-31 NOTE — Consult Note (Signed)
   Heart Failure Nurse Navigator Note  HFrEF now reported at less than 20% with grade 3 diastolic dysfunction.  Mild concentric LVH.  Moderately reduced right ventricular systolic function.  In March of this year ejection fraction was reported at 25 to 30%.  Severe concentric LVH.  Right ventricular systolic function was severely reduced.  He presented to the emergency room with complaints of weakness and ascites.  He also noted orthopnea, PND, dyspnea on exertion, abdominal distention and early satiety.  Comorbidities:  Chronic kidney disease Hyperlipidemia Hypertension  Labs:  Sodium 136, potassium 4.5, chloride 105, CO2 18, BUN 64, creatinine 1.88 down from 2.06 of 2 days ago.  BNP 4339 up from 3231 from 3 days ago Weight is 101 kg Blood pressure 115/77 Intake 120 mL Output 1850 mL  Medications:  Enteric aspirin 81 mg daily Atorvastatin 80 mg daily Farxiga 10 mg daily Furosemide 40 mg IV every 12 Metoprolol succinate 50 mg daily Potassium chloride when he milliequivalents daily  Entresto 24/26 mg twice a day is currently being held.    Met with patient and his sister who was at the bedside.  Went over signs and symptoms to report such as fatigue, abdominal enlargement, lack of appetite, lower extremity swelling as signs to report to physician or to the heart failure clinic..  States since his brother died 3 weeks ago he has not had any energy and mostly laid in bed only to get up to go to the bathroom.  He was not weighing himself daily.  Patient discussed palliative, he  was not pleased with that  but explained that palliative is to help with planning, it does not mean end-of-life.  At this time patient wants everything that can be done to be done.  He was taking his medications at home as ordered had not missed any doses.  Explained to his sister that he is on most of the guideline directed medical therapy and that if kidney function improves could look at titrating up.   The only medication that he was not on for the GDMT was Aldactone.  The sister asked if there were any other options, explained to her that we did have an advanced heart failure clinic in [place], but both she and the patient states that transportation to [place] would be a problem.  They had no further questions, we will continue to follow along.   He has an appointment in the outpatient heart failure clinic on November 1 at 10:30 AM.   Pricilla Riffle RN [facility]

## 2021-08-31 NOTE — Care Management Important Message (Signed)
Important Message  Patient Details  Name: Tyler Ochoa MRN: [id number] Date of Birth: 29-Aug-1951   Medicare Important Message Given:  N/A - LOS <3 / Initial given by admissions  Initial Medicare IM reviewed with patient via room phone by Caprice Renshaw, Patient Access Associate on 08/31/2021 at 9:27am.   [name] 08/31/2021, 1:32 PM

## 2021-08-31 NOTE — Progress Notes (Addendum)
PROGRESS NOTE  Tyler Ochoa JKK:[id number] DOB: 10-24-1951   PCP: Queen Slough, MD  Patient is from:.  [place] rolling walker and cane.  Lives with his girlfriend.  DOA: 08/28/2021 LOS: 3  Chief complaints:  Chief Complaint  Patient presents with   Weakness   Ascites     Brief Narrative / Interim history: 70 year old male with PMH of systolic CHF, chronic edema, osteoarthritis, debility, HTN and HLD presenting with generalized weakness, dry cough, DOE, BLE edema and orthopnea, and admitted with acute on chronic systolic CHF, possible non-STEMI and AKI.  BNP elevated to 3200.  Troponin elevated to 420 and improved to 327.  Started on IV Lasix and IV heparin.  Cardiology consulted.  TTE with LVEF <20% (25 to 30% in 01/2021), G3 DD and moderate RAE.  No further cardiac or pulmonary evaluation per cardiologist.  Therapy recommended SNF.  Subjective: Seen and examined earlier this morning.  No major events overnight of this morning.  No complaints.  He denies chest pain, dyspnea, GI or UTI symptoms.  Objective: Vitals:   08/31/21 0211 08/31/21 0547 08/31/21 1307 08/31/21 1550  BP: 115/77  (!) 110/53 119/81  Pulse: 75  75 78  Resp: 18  20 17   Temp: 98.5 F (36.9 C)  98.1 F (36.7 C) 98.7 F (37.1 C)  TempSrc:   Oral   SpO2: 96%  98% 98%  Weight:  101 kg    Height:        Intake/Output Summary (Last 24 hours) at 08/31/2021 1708 Last data filed at 08/31/2021 1500 Gross per 24 hour  Intake 480 ml  Output 925 ml  Net -445 ml   Filed Weights   08/28/21 1453 08/29/21 2031 08/31/21 0547  Weight: 101.6 kg 100.8 kg 101 kg    Examination:  GENERAL: No apparent distress.  Nontoxic. HEENT: MMM.  Vision and hearing grossly intact.  NECK: Supple.  No apparent JVD.  RESP: ctab save for mild rales at bases CVS:  RRR. Heart sounds normal.  ABD/GI/GU: BS+. Abd soft, NTND.  MSK/EXT:  Moves extremities. No apparent deformity.  1+ BLE edema, right> left SKIN: no apparent skin  lesion or wound NEURO: Awake and alert. Oriented appropriately.  No apparent focal neuro deficit. PSYCH: Calm. Normal affect.   Procedures:  None  Microbiology summarized: COVID-19 and influenza PCR nonreactive.  Assessment & Plan: Acute on chronic combined CHF: TTE with LVEF of <20% (25 to 30% in 01/2021), G3 DD and moderate RAE.  Reportedly had generalized weakness, DOE, 3 pillow orthopnea and BLE edema on presentation although  patient denies significant acute change.  BNP 3200>> 4400.  CXR with stable cardiomegaly.  Elevated troponin likely from demand ischemia as well. -No further cardiac evaluation from cardiology standpoint, needs outpt f/u with dr. 02/2021 -Continue IV Lasix 40 mg, decrease frequency to 40 qd, f/u kidney function from this morning, may need to pause further diuresis if cr up-trends -GDMT-Entresto, Toprol-XL and Farxiga -Monitor fluid status, renal functions and electrolytes. -Sodium and fluid restriction. -Palliative medicine consulted for goal of care discussion.  Elevated troponin without significant delta: Likely demand ischemia.  No RWMA on TTE. -Continue statin, aspirin and beta-blocker  AKI/azotemia: Cardiorenal?  Base creatinine appears to be mid 1s, up to 1.8 yesterday -Continue monitoring - diuresis as above  Elevated liver enzymes/hyperbilirubinemia/coagulopathy: CK within normal.  Denies EtOH.  Likely congestive hepatopathy as improving w/ diuresis. Appears to have hx of elevated bilirubin Recent Labs  Lab 08/28/21 1506 08/30/21 0448 08/31/21  1315  AST 136* 72* 62*  ALT 106* 74* 60*  ALKPHOS 163* 143* 133*  BILITOT 4.2* 4.3* 4.4*  PROT 6.7 6.4* 6.3*  ALBUMIN 2.9* 2.7*  2.8* 2.8*  -Continue monitoring LFTs - will check RUQ u/s  Essential hypertension: Normotensive. -Cardiac meds as above  Hyperlipidemia:  -Continue statin.  Ambulatory dysfunction/knee osteoarthritis -Therapy recommended SNF, patient interested, toc to pursue  BLE  edema, Rt>Lt Likely 2/2 chf. Dopplers neg for dvt  Thrombocytopenia: Stable Recent Labs  Lab 08/28/21 1506 08/29/21 0623 08/30/21 0448  PLT 133* 135* 139*  -Continue monitoring  Goal of care counseling: Patient with advanced CHF and other comorbidity as above.  Poor long-term prognosis.  Hospice candidate.  However, patient still full code and prefers to remain full code despite risk and benefit discussion.  -Palliative medicine consulted.  Body mass index is 28.59 kg/m.       Pressure Injury 08/29/21 Buttocks Medial Stage 2 -  Partial thickness loss of dermis presenting as a shallow open injury with a red, pink wound bed without slough. (Active)  08/29/21 2109  Location: Buttocks  Location Orientation: Medial  Staging: Stage 2 -  Partial thickness loss of dermis presenting as a shallow open injury with a red, pink wound bed without slough.  Wound Description (Comments):   Present on Admission: Yes   DVT prophylaxis:  heparin injection 5,000 Units Start: 08/29/21 2200  Code Status: Full code Family Communication: Patient and/or RN. Available if any question.  Level of care: Progressive Cardiac Status is: Inpatient  Remains inpatient appropriate because: Safe disposition/SNF       Consultants:  Cardiology   Sch Meds:  Scheduled Meds:  aspirin EC  81 mg Oral Daily   atorvastatin  80 mg Oral QHS   dapagliflozin propanediol  10 mg Oral QAC breakfast   [START ON 09/01/2021] furosemide  40 mg Intravenous Daily   heparin injection (subcutaneous)  5,000 Units Subcutaneous Q8H   metoprolol succinate  50 mg Oral Q breakfast   potassium chloride SA  20 mEq Oral Daily   Continuous Infusions:   PRN Meds:.acetaminophen **OR** acetaminophen, albuterol, magnesium hydroxide, ondansetron **OR** ondansetron (ZOFRAN) IV, traZODone  Antimicrobials: Anti-infectives (From admission, onward)    None        I have personally reviewed the following labs and  images: CBC: Recent Labs  Lab 08/28/21 1506 08/29/21 0623 08/30/21 0448  WBC 3.8* 4.3 3.5*  NEUTROABS 2.7  --   --   HGB 16.7 17.2* 16.0  HCT 47.2 52.5* 46.4  MCV 86.1 86.3 83.8  PLT 133* 135* 139*   BMP &GFR Recent Labs  Lab 08/28/21 1506 08/29/21 0623 08/30/21 0448 08/31/21 1315  NA 133* 136 135 134*  K 4.9 4.5 4.2 4.9  CL 105 105 108 105  CO2 17* 18* 18* 18*  GLUCOSE 114* 94 90 120*  BUN 60* 64* 65* 60*  CREATININE 2.06* 1.88* 1.90* 1.71*  CALCIUM 8.9 9.0 8.4* 8.3*  PHOS  --   --  4.1  --    Estimated Creatinine Clearance: 52.5 mL/min (A) (by C-G formula based on SCr of 1.71 mg/dL (H)). Liver & Pancreas: Recent Labs  Lab 08/28/21 1506 08/30/21 0448 08/31/21 1315  AST 136* 72* 62*  ALT 106* 74* 60*  ALKPHOS 163* 143* 133*  BILITOT 4.2* 4.3* 4.4*  PROT 6.7 6.4* 6.3*  ALBUMIN 2.9* 2.7*  2.8* 2.8*   No results for input(s): LIPASE, AMYLASE in the last 168 hours. No results for  input(s): AMMONIA in the last 168 hours. Diabetic: No results for input(s): HGBA1C in the last 72 hours. No results for input(s): GLUCAP in the last 168 hours. Cardiac Enzymes: Recent Labs  Lab 08/30/21 0448  CKTOTAL 173   No results for input(s): PROBNP in the last 8760 hours. Coagulation Profile: Recent Labs  Lab 08/29/21 0108  INR 1.8*   Thyroid Function Tests: No results for input(s): TSH, T4TOTAL, FREET4, T3FREE, THYROIDAB in the last 72 hours. Lipid Profile: No results for input(s): CHOL, HDL, LDLCALC, TRIG, CHOLHDL, LDLDIRECT in the last 72 hours. Anemia Panel: No results for input(s): VITAMINB12, FOLATE, FERRITIN, TIBC, IRON, RETICCTPCT in the last 72 hours. Urine analysis:    Component Value Date/Time   COLORURINE YELLOW (A) 03/07/2021 1705   APPEARANCEUR CLEAR (A) 03/07/2021 1705   LABSPEC 1.010 03/07/2021 1705   PHURINE 5.0 03/07/2021 1705   GLUCOSEU >=500 (A) 03/07/2021 1705   HGBUR SMALL (A) 03/07/2021 1705   BILIRUBINUR NEGATIVE 03/07/2021 1705    KETONESUR NEGATIVE 03/07/2021 1705   PROTEINUR 30 (A) 03/07/2021 1705   NITRITE NEGATIVE 03/07/2021 1705   LEUKOCYTESUR NEGATIVE 03/07/2021 1705   Sepsis Labs: Invalid input(s): PROCALCITONIN, LACTICIDVEN  Microbiology: Recent Results (from the past 240 hour(s))  Resp Panel by RT-PCR (Flu A&B, Covid) Nasopharyngeal Swab     Status: None   Collection Time: 08/28/21  9:43 PM   Specimen: Nasopharyngeal Swab; Nasopharyngeal(NP) swabs in vial transport medium  Result Value Ref Range Status   SARS Coronavirus 2 by RT PCR NEGATIVE NEGATIVE Final    Comment: (NOTE) SARS-CoV-2 target nucleic acids are NOT DETECTED.  The SARS-CoV-2 RNA is generally detectable in upper respiratory specimens during the acute phase of infection. The lowest concentration of SARS-CoV-2 viral copies this assay can detect is 138 copies/mL. A negative result does not preclude SARS-Cov-2 infection and should not be used as the sole basis for treatment or other patient management decisions. A negative result may occur with  improper specimen collection/handling, submission of specimen other than nasopharyngeal swab, presence of viral mutation(s) within the areas targeted by this assay, and inadequate number of viral copies(<138 copies/mL). A negative result must be combined with clinical observations, patient history, and epidemiological information. The expected result is Negative.  Fact Sheet for Patients:  BloggerCourse.com  Fact Sheet for Healthcare Providers:  SeriousBroker.it  This test is no t yet approved or cleared by the [place] FDA and  has been authorized for detection and/or diagnosis of SARS-CoV-2 by FDA under an Emergency Use Authorization (EUA). This EUA will remain  in effect (meaning this test can be used) for the duration of the COVID-19 declaration under Section 564(b)(1) of the Act, 21 U.S.C.section 360bbb-3(b)(1), unless the  authorization is terminated  or revoked sooner.       Influenza A by PCR NEGATIVE NEGATIVE Final   Influenza B by PCR NEGATIVE NEGATIVE Final    Comment: (NOTE) The Xpert Xpress SARS-CoV-2/FLU/RSV plus assay is intended as an aid in the diagnosis of influenza from Nasopharyngeal swab specimens and should not be used as a sole basis for treatment. Nasal washings and aspirates are unacceptable for Xpert Xpress SARS-CoV-2/FLU/RSV testing.  Fact Sheet for Patients: BloggerCourse.com  Fact Sheet for Healthcare Providers: SeriousBroker.it  This test is not yet approved or cleared by the [place] FDA and has been authorized for detection and/or diagnosis of SARS-CoV-2 by FDA under an Emergency Use Authorization (EUA). This EUA will remain in effect (meaning this test can be used) for  the duration of the COVID-19 declaration under Section 564(b)(1) of the Act, 21 U.S.C. section 360bbb-3(b)(1), unless the authorization is terminated or revoked.  Performed at [facility], [address] [place], [place] [address]     Radiology Studies: No results found.    Shonna Chock, MD [facility]  If 7PM-7AM, please contact night-coverage [email or web address] 08/31/2021, 5:08 PM

## 2021-08-31 NOTE — TOC Initial Note (Signed)
Transition of Care [facility]) - Initial/Assessment Note    Patient Details  Name: Tyler Ochoa MRN: [id number] Date of Birth: [birth year]  [facility] CM/SW Contact:    Maree Krabbe, LCSW Phone Number: 08/31/2021, 3:55 PM  Clinical Narrative:   CSW spoke with pt and pt's son at bedside. Pt states he is agreeable to SNF. Pt would be interested in [facility] and [facility] as second choice. Pt is agreeable to CSW send referral and start auth if accepted. Pt does want to speak to his sister.   [facility] will [facility] pt. Auth was started.                Expected Discharge Plan: Skilled Nursing Facility Barriers to Discharge: Continued Medical Work up   Patient Goals and CMS Choice Patient states their goals for this hospitalization and ongoing recovery are:: to go to SNF   Choice offered to / list presented to : Patient  Expected Discharge Plan and Services Expected Discharge Plan: Skilled Nursing Facility In-house Referral: Clinical Social Work   Post Acute Care Choice: Skilled Nursing Facility Living arrangements for the past 2 months: Single Family Home                                      Prior Living Arrangements/Services Living arrangements for the past 2 months: Single Family Home Lives with:: Significant Other Patient language and need for interpreter reviewed:: Yes Do you feel safe going back to the place where you live?: Yes      Need for Family Participation in Patient Care: Yes (Comment) Care giver support system in place?: Yes (comment)   Criminal Activity/Legal Involvement Pertinent to Current Situation/Hospitalization: No - Comment as needed  Activities of Daily Living Home Assistive Devices/Equipment: Shower chair with back, Environmental consultant (specify type), Eyeglasses (four wheel walker) ADL Screening (condition at time of admission) Patient's cognitive ability adequate to safely complete daily activities?: Yes Is the patient deaf or have  difficulty hearing?: No Does the patient have difficulty seeing, even when wearing glasses/contacts?: No Does the patient have difficulty concentrating, remembering, or making decisions?: No Patient able to express need for assistance with ADLs?: Yes Does the patient have difficulty dressing or bathing?: Yes Independently performs ADLs?: No Does the patient have difficulty walking or climbing stairs?: Yes Weakness of Legs: Both Weakness of Arms/Hands: Both  Permission Sought/Granted Permission sought to share information with : Family Supports Permission granted to share information with : Yes, Verbal Permission Granted     Permission granted to share info w AGENCY: [facility]  Permission granted to share info w Relationship: son     Emotional Assessment Appearance:: Appears stated age Attitude/Demeanor/Rapport: Engaged Affect (typically observed): Accepting Orientation: : Oriented to Self, Oriented to Place, Oriented to  Time, Oriented to Situation Alcohol / Substance Use: Not Applicable Psych Involvement: No (comment)  Admission diagnosis:  Weakness [R53.1] NSTEMI (non-ST elevated myocardial infarction) (HCC) [I21.4] Right leg swelling [M79.89] Hypervolemia, unspecified hypervolemia type [E87.70] Patient Active Problem List   Diagnosis Date Noted   Pressure injury of skin 08/30/2021   NSTEMI (non-ST elevated myocardial infarction) (HCC) 08/28/2021   Acute exacerbation of CHF (congestive heart failure) (HCC) 02/02/2021   Essential hypertension 02/02/2021   Hyperlipidemia 02/02/2021   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 [place] [address] Phone: [phone] Fax: [phone]  [facility] [address]), [place] - [address] [place] ([place]) [place] [address] Phone:  [phone] Fax: [phone]     Social Determinants of Health (SDOH) Interventions    Readmission Risk Interventions No flowsheet data found.

## 2021-09-01 ENCOUNTER — Inpatient Hospital Stay: Payer: Medicare HMO

## 2021-09-01 DIAGNOSIS — R945 Abnormal results of liver function studies: Secondary | ICD-10-CM | POA: Diagnosis not present

## 2021-09-01 DIAGNOSIS — R531 Weakness: Secondary | ICD-10-CM | POA: Diagnosis not present

## 2021-09-01 DIAGNOSIS — I5023 Acute on chronic systolic (congestive) heart failure: Secondary | ICD-10-CM | POA: Diagnosis not present

## 2021-09-01 DIAGNOSIS — I517 Cardiomegaly: Secondary | ICD-10-CM

## 2021-09-01 DIAGNOSIS — L89302 Pressure ulcer of unspecified buttock, stage 2: Secondary | ICD-10-CM

## 2021-09-01 DIAGNOSIS — I214 Non-ST elevation (NSTEMI) myocardial infarction: Secondary | ICD-10-CM | POA: Diagnosis not present

## 2021-09-01 DIAGNOSIS — Z515 Encounter for palliative care: Secondary | ICD-10-CM

## 2021-09-01 DIAGNOSIS — R931 Abnormal findings on diagnostic imaging of heart and coronary circulation: Secondary | ICD-10-CM

## 2021-09-01 LAB — COMPREHENSIVE METABOLIC PANEL
ALT: 57 U/L — ABNORMAL HIGH (ref 0–44)
AST: 57 U/L — ABNORMAL HIGH (ref 15–41)
Albumin: 2.5 g/dL — ABNORMAL LOW (ref 3.5–5.0)
Alkaline Phosphatase: 130 U/L — ABNORMAL HIGH (ref 38–126)
Anion gap: 9 (ref 5–15)
BUN: 58 mg/dL — ABNORMAL HIGH (ref 8–23)
CO2: 19 mmol/L — ABNORMAL LOW (ref 22–32)
Calcium: 8.2 mg/dL — ABNORMAL LOW (ref 8.9–10.3)
Chloride: 106 mmol/L (ref 98–111)
Creatinine, Ser: 1.57 mg/dL — ABNORMAL HIGH (ref 0.61–1.24)
GFR, Estimated: 48 mL/min — ABNORMAL LOW (ref >60)
Glucose, Bld: 110 mg/dL — ABNORMAL HIGH (ref 70–99)
Potassium: 3.6 mmol/L (ref 3.5–5.1)
Sodium: 134 mmol/L — ABNORMAL LOW (ref 135–145)
Total Bilirubin: 3.8 mg/dL — ABNORMAL HIGH (ref 0.3–1.2)
Total Protein: 6 g/dL — ABNORMAL LOW (ref 6.5–8.1)

## 2021-09-01 LAB — PHOSPHORUS: Phosphorus: 2.7 mg/dL (ref 2.5–4.6)

## 2021-09-01 LAB — MAGNESIUM: Magnesium: 2.2 mg/dL (ref 1.7–2.4)

## 2021-09-01 MED ORDER — GUAIFENESIN-DM 100-10 MG/5ML PO SYRP
5.0000 mL | ORAL_SOLUTION | ORAL | Status: DC | PRN
  Administered 2021-09-01 – 2021-09-07 (×8): 5 mL via ORAL
  Filled 2021-09-01 (×8): qty 5

## 2021-09-01 MED ORDER — FUROSEMIDE 10 MG/ML IJ SOLN
4.0000 mg/h | INTRAVENOUS | Status: DC
  Administered 2021-09-01 – 2021-09-07 (×4): 4 mg/h via INTRAVENOUS
  Filled 2021-09-01 (×5): qty 20

## 2021-09-01 NOTE — Progress Notes (Signed)
PROGRESS NOTE  Tyler Ochoa ZOX:[id number] DOB: June 02, 1951   PCP: Queen Slough, MD  Patient is from:.  [place] rolling walker and cane.  Lives with his girlfriend.  DOA: 08/28/2021 LOS: 4  Chief complaints:  Chief Complaint  Patient presents with   Weakness   Ascites     Brief Narrative / Interim history: 70 year old male with PMH of systolic CHF, chronic edema, osteoarthritis, debility, HTN and HLD presenting with generalized weakness, dry cough, DOE, BLE edema and orthopnea, and admitted with acute on chronic systolic CHF, possible non-STEMI and AKI.  BNP elevated to 3200.  Troponin elevated to 420 and improved to 327.  Started on IV Lasix and IV heparin.  Cardiology consulted.  TTE with LVEF <20% (25 to 30% in 01/2021), G3 DD and moderate RAE.  No further cardiac or pulmonary evaluation per cardiologist.  Therapy recommended SNF.  Subjective: No significant event overnight, patient's son and patient wife realized that still patient has significant lower extremity edema and also has ascites.  Patient and patient's family would like to be seen by different cardiologist, they prefer to be seen by Dr. Mariah Milling group. Patient denied any chest palpitation, no shortness of breath.  Objective: Vitals:   09/01/21 0500 09/01/21 0514 09/01/21 0700 09/01/21 1034  BP:  107/77 108/70 100/69  Pulse:  78 77 83  Resp:  19 20 17   Temp:  (!) 97.5 F (36.4 C) 97.8 F (36.6 C) 97.8 F (36.6 C)  TempSrc:      SpO2:  98% 98% 98%  Weight: 99 kg     Height:        Intake/Output Summary (Last 24 hours) at 09/01/2021 1605 Last data filed at 09/01/2021 1340 Gross per 24 hour  Intake 600 ml  Output 100 ml  Net 500 ml   Filed Weights   08/29/21 2031 08/31/21 0547 09/01/21 0500  Weight: 100.8 kg 101 kg 99 kg    Examination:  GENERAL: No apparent distress.  Nontoxic. HEENT: MMM.  Vision and hearing grossly intact.  NECK: Supple.  No apparent JVD.  RESP: ctab save for mild rales at  bases CVS:  RRR. Heart sounds normal.  ABD/GI/GU: BS+. Abd soft, mildly distended due to ascites, nontender.  MSK/EXT:  Moves extremities. No apparent deformity.  4+ BLE edema, right> left SKIN: no apparent skin lesion or wound NEURO: Awake and alert. Oriented appropriately.  No apparent focal neuro deficit. PSYCH: Calm. Normal affect.   Procedures:  None  Microbiology summarized: COVID-19 and influenza PCR nonreactive.  Assessment & Plan: Acute on chronic combined CHF: TTE with LVEF of <20% (25 to 30% in 01/2021), G3 DD and moderate RAE.  Reportedly had generalized weakness, DOE, 3 pillow orthopnea and BLE edema on presentation although  patient denies significant acute change.  BNP 3200>> 4400.  CXR with stable cardiomegaly.  Elevated troponin likely from demand ischemia as well. -No further cardiac evaluation from cardiology standpoint, needs outpt f/u with dr. 02/2021 -s/p IV Lasix 40 mg daily, it was decreased due to elevated creatinine.  10/25 started Lasix IV infusion for gradual continuous diuresis.   -GDMT-Entresto, Toprol-XL and Farxiga -Monitor fluid status, renal functions and electrolytes. -Sodium and fluid restriction. -Palliative medicine consulted for goal of care discussion. We will consult cardiology Dr. 11/25 group tomorrow a.m.  Elevated troponin without significant delta: Likely demand ischemia.  No RWMA on TTE. -Continue statin, aspirin and beta-blocker  AKI/azotemia: Cardiorenal?  Base creatinine appears to be mid 1s, up to 1.57  today -Continue monitoring - diuresis as above  Elevated liver enzymes/hyperbilirubinemia/coagulopathy: CK within normal.  Denies EtOH.  Likely congestive hepatopathy as improving w/ diuresis. Appears to have hx of elevated bilirubin Recent Labs  Lab 08/28/21 1506 08/30/21 0448 08/31/21 1315 09/01/21 0418  AST 136* 72* 62* 57*  ALT 106* 74* 60* 57*  ALKPHOS 163* 143* 133* 130*  BILITOT 4.2* 4.3* 4.4* 3.8*  PROT 6.7 6.4* 6.3*  6.0*  ALBUMIN 2.9* 2.7*  2.8* 2.8* 2.5*  -Continue monitoring LFTs - RUQ u/s shows suspected liver cirrhosis, mild ascites and right renal mass, recommended CT or MRI Follow CT scan abdomen for renal mass and liver cirrhosis   Essential hypertension: Normotensive. -Cardiac meds as above  Hyperlipidemia:  -Continue statin.  Ambulatory dysfunction/knee osteoarthritis -Therapy recommended SNF, patient interested, toc to pursue  BLE edema, Rt>Lt Likely 2/2 chf. Dopplers neg for dvt  Thrombocytopenia: Stable Recent Labs  Lab 08/28/21 1506 08/29/21 0623 08/30/21 0448  PLT 133* 135* 139*  -Continue monitoring  Goal of care counseling: Patient with advanced CHF and other comorbidity as above.  Poor long-term prognosis.  Hospice candidate.  However, patient still full code and prefers to remain full code despite risk and benefit discussion.  -Palliative medicine consulted.  Body mass index is 28.02 kg/m.   Pressure Injury 08/29/21 Buttocks Medial Stage 2 -  Partial thickness loss of dermis presenting as a shallow open injury with a red, pink wound bed without slough. (Active)  08/29/21 2109  Location: Buttocks  Location Orientation: Medial  Staging: Stage 2 -  Partial thickness loss of dermis presenting as a shallow open injury with a red, pink wound bed without slough.  Wound Description (Comments):   Present on Admission: Yes   DVT prophylaxis:  Place TED hose Start: 09/01/21 1055 heparin injection 5,000 Units Start: 08/29/21 2200  Code Status: Full code Family Communication: Patient and/or RN. Available if any question.  Level of care: Progressive Cardiac Status is: Inpatient  Remains inpatient appropriate because: Safe disposition/SNF    Consultants:  Cardiology   Sch Meds:  Scheduled Meds:  aspirin EC  81 mg Oral Daily   atorvastatin  80 mg Oral QHS   dapagliflozin propanediol  10 mg Oral QAC breakfast   furosemide  40 mg Intravenous Daily   heparin  injection (subcutaneous)  5,000 Units Subcutaneous Q8H   metoprolol succinate  50 mg Oral Q breakfast   potassium chloride SA  20 mEq Oral Daily   Continuous Infusions:   PRN Meds:.acetaminophen **OR** acetaminophen, albuterol, magnesium hydroxide, ondansetron **OR** ondansetron (ZOFRAN) IV, traZODone  Antimicrobials: Anti-infectives (From admission, onward)    None        I have personally reviewed the following labs and images: CBC: Recent Labs  Lab 08/28/21 1506 08/29/21 0623 08/30/21 0448  WBC 3.8* 4.3 3.5*  NEUTROABS 2.7  --   --   HGB 16.7 17.2* 16.0  HCT 47.2 52.5* 46.4  MCV 86.1 86.3 83.8  PLT 133* 135* 139*   BMP &GFR Recent Labs  Lab 08/28/21 1506 08/29/21 0623 08/30/21 0448 08/31/21 1315 09/01/21 0418  NA 133* 136 135 134* 134*  K 4.9 4.5 4.2 4.9 3.6  CL 105 105 108 105 106  CO2 17* 18* 18* 18* 19*  GLUCOSE 114* 94 90 120* 110*  BUN 60* 64* 65* 60* 58*  CREATININE 2.06* 1.88* 1.90* 1.71* 1.57*  CALCIUM 8.9 9.0 8.4* 8.3* 8.2*  MG  --   --   --   --  2.2  PHOS  --   --  4.1  --  2.7   Estimated Creatinine Clearance: 56.6 mL/min (A) (by C-G formula based on SCr of 1.57 mg/dL (H)). Liver & Pancreas: Recent Labs  Lab 08/28/21 1506 08/30/21 0448 08/31/21 1315 09/01/21 0418  AST 136* 72* 62* 57*  ALT 106* 74* 60* 57*  ALKPHOS 163* 143* 133* 130*  BILITOT 4.2* 4.3* 4.4* 3.8*  PROT 6.7 6.4* 6.3* 6.0*  ALBUMIN 2.9* 2.7*  2.8* 2.8* 2.5*   No results for input(s): LIPASE, AMYLASE in the last 168 hours. No results for input(s): AMMONIA in the last 168 hours. Diabetic: No results for input(s): HGBA1C in the last 72 hours. No results for input(s): GLUCAP in the last 168 hours. Cardiac Enzymes: Recent Labs  Lab 08/30/21 0448  CKTOTAL 173   No results for input(s): PROBNP in the last 8760 hours. Coagulation Profile: Recent Labs  Lab 08/29/21 0108  INR 1.8*   Thyroid Function Tests: No results for input(s): TSH, T4TOTAL, FREET4, T3FREE,  THYROIDAB in the last 72 hours. Lipid Profile: No results for input(s): CHOL, HDL, LDLCALC, TRIG, CHOLHDL, LDLDIRECT in the last 72 hours. Anemia Panel: No results for input(s): VITAMINB12, FOLATE, FERRITIN, TIBC, IRON, RETICCTPCT in the last 72 hours. Urine analysis:    Component Value Date/Time   COLORURINE YELLOW (A) 03/07/2021 1705   APPEARANCEUR CLEAR (A) 03/07/2021 1705   LABSPEC 1.010 03/07/2021 1705   PHURINE 5.0 03/07/2021 1705   GLUCOSEU >=500 (A) 03/07/2021 1705   HGBUR SMALL (A) 03/07/2021 1705   BILIRUBINUR NEGATIVE 03/07/2021 1705   KETONESUR NEGATIVE 03/07/2021 1705   PROTEINUR 30 (A) 03/07/2021 1705   NITRITE NEGATIVE 03/07/2021 1705   LEUKOCYTESUR NEGATIVE 03/07/2021 1705   Sepsis Labs: Invalid input(s): PROCALCITONIN, LACTICIDVEN  Microbiology: Recent Results (from the past 240 hour(s))  Resp Panel by RT-PCR (Flu A&B, Covid) Nasopharyngeal Swab     Status: None   Collection Time: 08/28/21  9:43 PM   Specimen: Nasopharyngeal Swab; Nasopharyngeal(NP) swabs in vial transport medium  Result Value Ref Range Status   SARS Coronavirus 2 by RT PCR NEGATIVE NEGATIVE Final    Comment: (NOTE) SARS-CoV-2 target nucleic acids are NOT DETECTED.  The SARS-CoV-2 RNA is generally detectable in upper respiratory specimens during the acute phase of infection. The lowest concentration of SARS-CoV-2 viral copies this assay can detect is 138 copies/mL. A negative result does not preclude SARS-Cov-2 infection and should not be used as the sole basis for treatment or other patient management decisions. A negative result may occur with  improper specimen collection/handling, submission of specimen other than nasopharyngeal swab, presence of viral mutation(s) within the areas targeted by this assay, and inadequate number of viral copies(<138 copies/mL). A negative result must be combined with clinical observations, patient history, and epidemiological information. The expected  result is Negative.  Fact Sheet for Patients:  BloggerCourse.com  Fact Sheet for Healthcare Providers:  SeriousBroker.it  This test is no t yet approved or cleared by the [place] FDA and  has been authorized for detection and/or diagnosis of SARS-CoV-2 by FDA under an Emergency Use Authorization (EUA). This EUA will remain  in effect (meaning this test can be used) for the duration of the COVID-19 declaration under Section 564(b)(1) of the Act, 21 U.S.C.section 360bbb-3(b)(1), unless the authorization is terminated  or revoked sooner.       Influenza A by PCR NEGATIVE NEGATIVE Final   Influenza B by PCR NEGATIVE NEGATIVE Final    Comment: (  NOTE) The Xpert Xpress SARS-CoV-2/FLU/RSV plus assay is intended as an aid in the diagnosis of influenza from Nasopharyngeal swab specimens and should not be used as a sole basis for treatment. Nasal washings and aspirates are unacceptable for Xpert Xpress SARS-CoV-2/FLU/RSV testing.  Fact Sheet for Patients: BloggerCourse.com  Fact Sheet for Healthcare Providers: SeriousBroker.it  This test is not yet approved or cleared by the [place] FDA and has been authorized for detection and/or diagnosis of SARS-CoV-2 by FDA under an Emergency Use Authorization (EUA). This EUA will remain in effect (meaning this test can be used) for the duration of the COVID-19 declaration under Section 564(b)(1) of the Act, 21 U.S.C. section 360bbb-3(b)(1), unless the authorization is terminated or revoked.  Performed at [facility], [address], [place], [place] [address]     Radiology Studies: US Abdomen Limited RUQ (LIVER/GB)  Result Date: 09/01/2021 CLINICAL DATA:  Abnormal liver function test EXAM: ULTRASOUND ABDOMEN LIMITED RIGHT UPPER QUADRANT COMPARISON:  None. FINDINGS: Gallbladder: Gallbladder sludge and nonshadowing but  discrete appearing filling defects favoring calculi measuring up to 9 mm. Borderline gallbladder wall thickening but no pericholecystic edema or focal tenderness. Common bile duct: Diameter: 3 mm Liver: Suggested surface lobulation of the liver with large fissures. No focal liver mass. Portal vein is patent on color Doppler imaging with normal direction of blood flow towards the liver. Other: 3 cm lesion in the upper pole right kidney. Trace ascites in the abdomen. IMPRESSION: 1. Suspected liver cirrhosis.  Trace ascites. 2. Gallbladder sludge/calculi. 3. Right upper pole renal mass measuring 3.2 cm, not seen in February 2022. Recommend enhanced CT or MRI if possible. Electronically Signed   By: Tiburcio Pea M.D.   On: 09/01/2021 10:01      Gillis Santa, MD [facility]  If 7PM-7AM, please contact night-coverage [email or web address] 09/01/2021, 4:05 PM

## 2021-09-01 NOTE — TOC Progression Note (Signed)
Transition of Care [facility]) - Progression Note    Patient Details  Name: Tyler Ochoa MRN: [id number] Date of Birth: 05-20-1951  Transition of Care [facility]) CM/SW Contact  Maree Krabbe, LCSW Phone Number: 09/01/2021, 9:01 AM  Clinical Narrative:   Iline Oven. Plan Auth ID [id number] Reola Calkins ID [id number] Information provided to Gavin Pound at [facility].   Expected Discharge Plan: Skilled Nursing Facility Barriers to Discharge: Continued Medical Work up  Expected Discharge Plan and Services Expected Discharge Plan: Skilled Nursing Facility In-house Referral: Clinical Social Work   Post Acute Care Choice: Skilled Nursing Facility Living arrangements for the past 2 months: Single Family Home                                       Social Determinants of Health (SDOH) Interventions    Readmission Risk Interventions No flowsheet data found.

## 2021-09-01 NOTE — Progress Notes (Addendum)
   Heart Failure Nurse Navigator Note   Met with patient in his room his significant other Shirlee Limerick and sister, Francesca Jewett were present.  Patient states that he is wanting to change cardiology groups, asking to see Dr. Gwenyth Ober group.  He is also asking about the dressing on his tailbone and if it should be  changed.  Have made his nurse aware.  Also talked with his nurse Roselyn Reef about accurate I and 0, as family is keeping tract and yesterdays tally does not agree with what is documented.    Also ordered Ted socks to be placed on his lower extremities  to facilitate movement of the fluid from his lower extremities.  Pricilla Riffle RN [facility]

## 2021-09-01 NOTE — Consult Note (Addendum)
Consultation Note Date: 09/01/2021   Patient Name: Tyler Ochoa  DOB: 12-07-1950  MRN: [id number]  Age / Sex: 70 y.o., male  PCP: Manson Allan, MD Referring Physician: Val Riles, MD  Reason for Consultation: Establishing goals of care  HPI/Patient Profile: 70 y.o. male  with past medical history of congestive heart failure, chronic edema, osteoarthritis, hypertension, hyperlipidemia, stage II wound on buttock present prior to admission, and a decreased ejection fracture of less than 20% (down from March 2022 when it was 25 to 30%), and severe concentric left ventricular hypertrophy. Admitted on 08/28/2021 with generalized weakness, dry cough, bilateral lower extremity edema, and orthopnea.  Palliative medicine was consulted for goals of care discussion.  Clinical Assessment and Goals of Care: I have reviewed medical records including EPIC notes, labs and imaging, received report from nursing, assessed the patient and then met with patient and his girlfriend at bedside to discuss diagnosis prognosis, GOC, EOL wishes, disposition and options.  I introduced Palliative Medicine as specialized medical care for people living with serious illness. It focuses on providing relief from the symptoms and stress of a serious illness. The goal is to improve quality of life for both the patient and the family.  We discussed a brief life review of the patient.  He has been living with his girlfriend for over 19 years.  He has 2 living sons and 1 daughter that live nearby.  1 son died by suicide several years ago.  Of note one of his sons has two biological children with one of his girlfriend's daughters.  They have 4 grandchildren in total.  Recently lost his oldest brother with whom he was close.  As far as functional and nutritional status prior to admission, patient admits he had no appetite because after a few bites  he felt full.  He was not weighing himself daily.  He was not watching his salt intake.  He was not being careful about his water intake.  He shares he did not want to come to the hospital but did at the insistence of the concern of his girlfriend.  She shares that he has not been eating or drinking much of anything daily for the past few months.  We discussed patient's current illness and what it means in the larger context of patient's on-going co-morbidities.  Natural disease trajectory and expectations at EOL were discussed.  I reviewed in detail that congestive heart failure is a chronic and progressive disease.  I outlined that his heart appears to be functioning at a smaller percentage than it once was earlier this year.  His girlfriend asked how I can make his heart better.  I educated them that congestive heart failure is a disease that we try to optimize what ever function the heart has left.  There is no magic reversing agent or medication that we can give to make his heart function at a higher pace.  I attempted to elicit values and goals of care important to the patient.  He shares that  he knows he has a bad heart but he wants to continue to be on this earth as long as he can. The difference between aggressive medical intervention and comfort care was considered in light of the patient's goals of care.  He says he does not want a comfort path but he is not sure how aggressive he wants to be in his care.  He was curious to know the results of his ultrasound which I shared with him.  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  I described in detail that this is a nonreversible problem as well.  He is concerned about the mass on his kidney.  I shared that the medical team plans on doing a follow-up CT or MRI to investigate this.  I build rapport and trust with the patient today.  I made it clear that palliative care is not hospice as he seemed relieved by this statement.  He shared  that his mother had had palliative care and that they were wonderful and helpful for her.  I assured him that I would continue to follow along with him during this hospitalization and continue discussions regarding his goals of care and advance care planning.   Discussed with patient/family the importance of continued conversation with family and the medical providers regarding overall plan of care and treatment options, ensuring decisions are within the context of the patient's values and GOCs.    Questions and concerns were addressed. The family was encouraged to call with questions or concerns.   Primary Decision Maker PATIENT  Code Status/Advance Care Planning: Full code  Prognosis:   Unable to determine  Discharge Planning: To Be Determined  Primary Diagnoses: Present on Admission:  NSTEMI (non-ST elevated myocardial infarction) [facility])   Physical Exam Vitals and nursing note reviewed.  Constitutional:      Appearance: He is not ill-appearing or toxic-appearing.  HENT:     Head: Normocephalic and atraumatic.     Mouth/Throat:     Mouth: Mucous membranes are moist.  Cardiovascular:     Rate and Rhythm: Normal rate.     Pulses: Normal pulses.  Pulmonary:     Effort: Pulmonary effort is normal.  Abdominal:     General: There is distension.     Palpations: Abdomen is soft.  Skin:    General: Skin is warm.  Neurological:     Mental Status: He is alert. Mental status is at baseline.  Psychiatric:        Mood and Affect: Mood normal.        Behavior: Behavior normal.        Thought Content: Thought content normal.        Judgment: Judgment normal.    Vital Signs: BP 100/69 (BP Location: Left Arm)   Pulse 83   Temp 97.8 F (36.6 C)   Resp 17   Ht $R'6\' 2"'ZO$  (1.88 m)   Wt 99 kg   SpO2 98%   BMI 28.02 kg/m  Pain Scale: 0-10   Pain Score: 0-No pain SpO2: SpO2: 98 % O2 Device:SpO2: 98 % O2 Flow Rate: .   I discussed this patient's plan of care with nursing,  patient, patient's girlfriend.  Thank you for this consult. Palliative medicine will continue to follow and assist holistically.   Time Total: 70 minutes Greater than 50%  of this time was spent counseling and coordinating care related to the above assessment and plan.  Signed by: Jordan Hawks, DNP, FNP-BC  Palliative Medicine    Please contact Palliative Medicine Team phone at [phone] for questions and concerns.  For individual provider: See Shea Evans

## 2021-09-02 DIAGNOSIS — I5020 Unspecified systolic (congestive) heart failure: Secondary | ICD-10-CM | POA: Diagnosis not present

## 2021-09-02 DIAGNOSIS — R945 Abnormal results of liver function studies: Secondary | ICD-10-CM | POA: Diagnosis not present

## 2021-09-02 DIAGNOSIS — I214 Non-ST elevation (NSTEMI) myocardial infarction: Secondary | ICD-10-CM | POA: Diagnosis not present

## 2021-09-02 LAB — CBC
HCT: 41.6 % (ref 39.0–52.0)
Hemoglobin: 15 g/dL (ref 13.0–17.0)
MCH: 30.1 pg (ref 26.0–34.0)
MCHC: 36.1 g/dL — ABNORMAL HIGH (ref 30.0–36.0)
MCV: 83.5 fL (ref 80.0–100.0)
Platelets: 153 10*3/uL (ref 150–400)
RBC: 4.98 MIL/uL (ref 4.22–5.81)
RDW: 17.2 % — ABNORMAL HIGH (ref 11.5–15.5)
WBC: 3.4 10*3/uL — ABNORMAL LOW (ref 4.0–10.5)
nRBC: 0 % (ref 0.0–0.2)

## 2021-09-02 LAB — COMPREHENSIVE METABOLIC PANEL
ALT: 50 U/L — ABNORMAL HIGH (ref 0–44)
AST: 52 U/L — ABNORMAL HIGH (ref 15–41)
Albumin: 2.7 g/dL — ABNORMAL LOW (ref 3.5–5.0)
Alkaline Phosphatase: 127 U/L — ABNORMAL HIGH (ref 38–126)
Anion gap: 8 (ref 5–15)
BUN: 51 mg/dL — ABNORMAL HIGH (ref 8–23)
CO2: 19 mmol/L — ABNORMAL LOW (ref 22–32)
Calcium: 8.1 mg/dL — ABNORMAL LOW (ref 8.9–10.3)
Chloride: 105 mmol/L (ref 98–111)
Creatinine, Ser: 1.58 mg/dL — ABNORMAL HIGH (ref 0.61–1.24)
GFR, Estimated: 47 mL/min — ABNORMAL LOW (ref >60)
Glucose, Bld: 108 mg/dL — ABNORMAL HIGH (ref 70–99)
Potassium: 3.7 mmol/L (ref 3.5–5.1)
Sodium: 132 mmol/L — ABNORMAL LOW (ref 135–145)
Total Bilirubin: 3.7 mg/dL — ABNORMAL HIGH (ref 0.3–1.2)
Total Protein: 6.3 g/dL — ABNORMAL LOW (ref 6.5–8.1)

## 2021-09-02 LAB — MAGNESIUM: Magnesium: 2 mg/dL (ref 1.7–2.4)

## 2021-09-02 LAB — AMMONIA: Ammonia: 30 umol/L (ref 9–35)

## 2021-09-02 LAB — PHOSPHORUS: Phosphorus: 2.7 mg/dL (ref 2.5–4.6)

## 2021-09-02 MED ORDER — METOPROLOL SUCCINATE ER 25 MG PO TB24
12.5000 mg | ORAL_TABLET | Freq: Every day | ORAL | Status: DC
  Administered 2021-09-03 – 2021-09-09 (×7): 12.5 mg via ORAL
  Filled 2021-09-02: qty 1
  Filled 2021-09-02: qty 0.5
  Filled 2021-09-02 (×5): qty 1

## 2021-09-02 MED ORDER — SODIUM BICARBONATE 650 MG PO TABS
650.0000 mg | ORAL_TABLET | Freq: Three times a day (TID) | ORAL | Status: AC
  Administered 2021-09-02 (×3): 650 mg via ORAL
  Filled 2021-09-02 (×3): qty 1

## 2021-09-02 NOTE — Progress Notes (Signed)
PT Cancellation Note  Patient Details Name: Tyler Ochoa MRN: [id number] DOB: 07-30-1951   Cancelled Treatment:    Reason Eval/Treat Not Completed: Patient declined, no reason specified;Fatigue/lethargy limiting ability to participate. Pt chart reviewed. Lights out in room with pt and visitor resting. Pt visitor states pt walked to the bathroom earlier and therefore has "already done therapy today." PT offered multiple options for therapy - pt declines stating he hasn't received any rest and is tired. Pt asked for PT to return tomorrow. PT encouraged continued ambulation into the bathroom for increased mobility. PT will continue to follow and attempt treatment at later date.      Basilia Jumbo PT, DPT 09/02/21 1:46 PM [phone]

## 2021-09-02 NOTE — Progress Notes (Signed)
PROGRESS NOTE  Tyler Ochoa TIR:[id number] DOB: 1950/12/01   PCP: Queen Slough, MD  Patient is from:.  [place] rolling walker and cane.  Lives with his girlfriend.  DOA: 08/28/2021 LOS: 5  Chief complaints:  Chief Complaint  Patient presents with   Weakness   Ascites     Brief Narrative / Interim history: 70 year old male with PMH of systolic CHF, chronic edema, osteoarthritis, debility, HTN and HLD presenting with generalized weakness, dry cough, DOE, BLE edema and orthopnea, and admitted with acute on chronic systolic CHF, possible non-STEMI and AKI.  BNP elevated to 3200.  Troponin elevated to 420 and improved to 327.  Started on IV Lasix and IV heparin.  Cardiology consulted.  TTE with LVEF <20% (25 to 30% in 01/2021), G3 DD and moderate RAE.  No further cardiac or pulmonary evaluation per cardiologist.  Therapy recommended SNF.  Subjective: No significant event overnight, patient was resting comfortably, denied any worsening of shortness of breath, patient still has significant edema in the lower extremities, patient does not feel improvement.  Patient is making enough urine.  Denies any chest pain or palpitations, no any other active issues.   Objective: Vitals:   09/02/21 0346 09/02/21 0616 09/02/21 0723 09/02/21 1158  BP: 102/73  109/86 107/77  Pulse: 79  84 84  Resp: 16     Temp: 97.6 F (36.4 C)  98 F (36.7 C) 98.2 F (36.8 C)  TempSrc:    Oral  SpO2: 97%  96% 99%  Weight:  101.5 kg    Height:        Intake/Output Summary (Last 24 hours) at 09/02/2021 1516 Last data filed at 09/02/2021 0800 Gross per 24 hour  Intake 609.2 ml  Output 2050 ml  Net -1440.8 ml   Filed Weights   08/31/21 0547 09/01/21 0500 09/02/21 0616  Weight: 101 kg 99 kg 101.5 kg    Examination:  GENERAL: No apparent distress.  Nontoxic. HEENT: MMM.  Vision and hearing grossly intact.  NECK: Supple.  No apparent JVD.  RESP: ctab save for mild rales at bases CVS:  RRR. Heart  sounds normal.  ABD/GI/GU: BS+. Abd soft, mildly distended due to ascites, nontender.  MSK/EXT:  Moves extremities. No apparent deformity.  3-4+ BLE edema, right> left SKIN: no apparent skin lesion or wound NEURO: Awake and alert. Oriented appropriately.  No apparent focal neuro deficit. PSYCH: Calm. Normal affect.   Procedures:  None  Microbiology summarized: COVID-19 and influenza PCR nonreactive.  Assessment & Plan: Acute on chronic combined CHF: TTE with LVEF of <20% (25 to 30% in 01/2021), G3 DD and moderate RAE.  Reportedly had generalized weakness, DOE, 3 pillow orthopnea and BLE edema on presentation although  patient denies significant acute change.  BNP 3200>> 4400.  CXR with stable cardiomegaly.  Elevated troponin likely from demand ischemia as well. -No further cardiac evaluation from cardiology standpoint, needs outpt f/u with dr. Welton Flakes -s/p IV Lasix 40 mg daily, it was decreased due to elevated creatinine.  10/25 started Lasix IV infusion for gradual continuous diuresis.   -GDMT-Entresto, Toprol-XL and Farxiga -Monitor fluid status, renal functions and electrolytes. -Sodium and fluid restriction. -Palliative medicine consulted for goal of care discussion. Patient was seen by cardiology Dr. Welton Flakes, patient is noncompliant with follow-ups, patient wants to switch cardiologist, spoke to Dr. Welton Flakes and he is okay with that. Discussed with cardiologist [facility] who will follow patient and will schedule appointment as an outpatient as well.  Liver cirrhosis with ascites found on CT abdomen pelvis,  Unknown cause of liver cirrhosis could be due to congestion secondary to CHF. Continue diuretic as above Check ammonia level   Elevated troponin without significant delta: Likely demand ischemia.  No RWMA on TTE. -Continue statin, aspirin and beta-blocker  AKI/azotemia: Cardiorenal?  Base creatinine appears to be mid 1s, up to 1.57 today -Continue monitoring - diuresis as  above  Elevated liver enzymes/hyperbilirubinemia/coagulopathy: CK within normal.  Denies EtOH.  Likely congestive hepatopathy as improving w/ diuresis. Appears to have hx of elevated bilirubin Recent Labs  Lab 08/28/21 1506 08/30/21 0448 08/31/21 1315 09/01/21 0418 09/02/21 0220  AST 136* 72* 62* 57* 52*  ALT 106* 74* 60* 57* 50*  ALKPHOS 163* 143* 133* 130* 127*  BILITOT 4.2* 4.3* 4.4* 3.8* 3.7*  PROT 6.7 6.4* 6.3* 6.0* 6.3*  ALBUMIN 2.9* 2.7*  2.8* 2.8* 2.5* 2.7*  -Continue monitoring LFTs - RUQ u/s shows suspected liver cirrhosis, mild ascites and right renal mass, recommended CT or MRI CT scan abdomen pelvis shows liver cirrhosis, ascites could not rule out renal mass due to noncontrast study, recommended CT scan with contrast versus MRI with contrast.   Essential hypertension: Normotensive. -Cardiac meds as above  Hyperlipidemia:  -Continue statin.  Ambulatory dysfunction/knee osteoarthritis -Therapy recommended SNF, patient interested, toc to pursue  BLE edema, Rt>Lt Likely 2/2 chf. Dopplers neg for dvt  Thrombocytopenia: Stable, resolved Recent Labs  Lab 08/28/21 1506 08/29/21 0623 08/30/21 0448 09/02/21 0220  PLT 133* 135* 139* 153  -Continue monitoring  Right renal mass, patient was recommended to follow with PCP to repeat CT abdomen pelvis with contrast when renal functions improved versus MRI as an outpatient and follow-up with urology as an outpatient   Goal of care counseling: Patient with advanced CHF and other comorbidity as above.  Poor long-term prognosis.  Hospice candidate.  However, patient still full code and prefers to remain full code despite risk and benefit discussion.  -Palliative medicine consulted.  Body mass index is 28.02 kg/m.   Pressure Injury 08/29/21 Buttocks Medial Stage 2 -  Partial thickness loss of dermis presenting as a shallow open injury with a red, pink wound bed without slough. (Active)  08/29/21 2109  Location:  Buttocks  Location Orientation: Medial  Staging: Stage 2 -  Partial thickness loss of dermis presenting as a shallow open injury with a red, pink wound bed without slough.  Wound Description (Comments):   Present on Admission: Yes   DVT prophylaxis:  Place TED hose Start: 09/01/21 1055 heparin injection 5,000 Units Start: 08/29/21 2200  Code Status: Full code Family Communication: Patient and/or RN. Available if any question.  Level of care: Progressive Cardiac Status is: Inpatient  Remains inpatient appropriate because: Safe disposition/SNF    Consultants:  Cardiology   Sch Meds:  Scheduled Meds:  aspirin EC  81 mg Oral Daily   atorvastatin  80 mg Oral QHS   dapagliflozin propanediol  10 mg Oral QAC breakfast   heparin injection (subcutaneous)  5,000 Units Subcutaneous Q8H   [START ON 09/03/2021] metoprolol succinate  12.5 mg Oral Q breakfast   potassium chloride SA  20 mEq Oral Daily   sodium bicarbonate  650 mg Oral TID   Continuous Infusions:  furosemide (LASIX) 200 mg in dextrose 5% 100 mL (2mg /mL) infusion 4 mg/hr (09/01/21 2029)    PRN Meds:.acetaminophen **OR** acetaminophen, albuterol, guaiFENesin-dextromethorphan, magnesium hydroxide, ondansetron **OR** ondansetron (ZOFRAN) IV, traZODone  Antimicrobials: Anti-infectives (From admission, onward)  None        I have personally reviewed the following labs and images: CBC: Recent Labs  Lab 08/28/21 1506 08/29/21 0623 08/30/21 0448 09/02/21 0220  WBC 3.8* 4.3 3.5* 3.4*  NEUTROABS 2.7  --   --   --   HGB 16.7 17.2* 16.0 15.0  HCT 47.2 52.5* 46.4 41.6  MCV 86.1 86.3 83.8 83.5  PLT 133* 135* 139* 153   BMP &GFR Recent Labs  Lab 08/29/21 0623 08/30/21 0448 08/31/21 1315 09/01/21 0418 09/02/21 0220  NA 136 135 134* 134* 132*  K 4.5 4.2 4.9 3.6 3.7  CL 105 108 105 106 105  CO2 18* 18* 18* 19* 19*  GLUCOSE 94 90 120* 110* 108*  BUN 64* 65* 60* 58* 51*  CREATININE 1.88* 1.90* 1.71* 1.57*  1.58*  CALCIUM 9.0 8.4* 8.3* 8.2* 8.1*  MG  --   --   --  2.2 2.0  PHOS  --  4.1  --  2.7 2.7   Estimated Creatinine Clearance: 56.9 mL/min (A) (by C-G formula based on SCr of 1.58 mg/dL (H)). Liver & Pancreas: Recent Labs  Lab 08/28/21 1506 08/30/21 0448 08/31/21 1315 09/01/21 0418 09/02/21 0220  AST 136* 72* 62* 57* 52*  ALT 106* 74* 60* 57* 50*  ALKPHOS 163* 143* 133* 130* 127*  BILITOT 4.2* 4.3* 4.4* 3.8* 3.7*  PROT 6.7 6.4* 6.3* 6.0* 6.3*  ALBUMIN 2.9* 2.7*  2.8* 2.8* 2.5* 2.7*   No results for input(s): LIPASE, AMYLASE in the last 168 hours. No results for input(s): AMMONIA in the last 168 hours. Diabetic: No results for input(s): HGBA1C in the last 72 hours. No results for input(s): GLUCAP in the last 168 hours. Cardiac Enzymes: Recent Labs  Lab 08/30/21 0448  CKTOTAL 173   No results for input(s): PROBNP in the last 8760 hours. Coagulation Profile: Recent Labs  Lab 08/29/21 0108  INR 1.8*   Thyroid Function Tests: No results for input(s): TSH, T4TOTAL, FREET4, T3FREE, THYROIDAB in the last 72 hours. Lipid Profile: No results for input(s): CHOL, HDL, LDLCALC, TRIG, CHOLHDL, LDLDIRECT in the last 72 hours. Anemia Panel: No results for input(s): VITAMINB12, FOLATE, FERRITIN, TIBC, IRON, RETICCTPCT in the last 72 hours. Urine analysis:    Component Value Date/Time   COLORURINE YELLOW (A) 03/07/2021 1705   APPEARANCEUR CLEAR (A) 03/07/2021 1705   LABSPEC 1.010 03/07/2021 1705   PHURINE 5.0 03/07/2021 1705   GLUCOSEU >=500 (A) 03/07/2021 1705   HGBUR SMALL (A) 03/07/2021 1705   BILIRUBINUR NEGATIVE 03/07/2021 1705   KETONESUR NEGATIVE 03/07/2021 1705   PROTEINUR 30 (A) 03/07/2021 1705   NITRITE NEGATIVE 03/07/2021 1705   LEUKOCYTESUR NEGATIVE 03/07/2021 1705   Sepsis Labs: Invalid input(s): PROCALCITONIN, LACTICIDVEN  Microbiology: Recent Results (from the past 240 hour(s))  Resp Panel by RT-PCR (Flu A&B, Covid) Nasopharyngeal Swab     Status: None    Collection Time: 08/28/21  9:43 PM   Specimen: Nasopharyngeal Swab; Nasopharyngeal(NP) swabs in vial transport medium  Result Value Ref Range Status   SARS Coronavirus 2 by RT PCR NEGATIVE NEGATIVE Final    Comment: (NOTE) SARS-CoV-2 target nucleic acids are NOT DETECTED.  The SARS-CoV-2 RNA is generally detectable in upper respiratory specimens during the acute phase of infection. The lowest concentration of SARS-CoV-2 viral copies this assay can detect is 138 copies/mL. A negative result does not preclude SARS-Cov-2 infection and should not be used as the sole basis for treatment or other patient management decisions. A negative result  may occur with  improper specimen collection/handling, submission of specimen other than nasopharyngeal swab, presence of viral mutation(s) within the areas targeted by this assay, and inadequate number of viral copies(<138 copies/mL). A negative result must be combined with clinical observations, patient history, and epidemiological information. The expected result is Negative.  Fact Sheet for Patients:  BloggerCourse.com  Fact Sheet for Healthcare Providers:  SeriousBroker.it  This test is no t yet approved or cleared by the [place] FDA and  has been authorized for detection and/or diagnosis of SARS-CoV-2 by FDA under an Emergency Use Authorization (EUA). This EUA will remain  in effect (meaning this test can be used) for the duration of the COVID-19 declaration under Section 564(b)(1) of the Act, 21 U.S.C.section 360bbb-3(b)(1), unless the authorization is terminated  or revoked sooner.       Influenza A by PCR NEGATIVE NEGATIVE Final   Influenza B by PCR NEGATIVE NEGATIVE Final    Comment: (NOTE) The Xpert Xpress SARS-CoV-2/FLU/RSV plus assay is intended as an aid in the diagnosis of influenza from Nasopharyngeal swab specimens and should not be used as a sole basis for treatment.  Nasal washings and aspirates are unacceptable for Xpert Xpress SARS-CoV-2/FLU/RSV testing.  Fact Sheet for Patients: BloggerCourse.com  Fact Sheet for Healthcare Providers: SeriousBroker.it  This test is not yet approved or cleared by the [place] FDA and has been authorized for detection and/or diagnosis of SARS-CoV-2 by FDA under an Emergency Use Authorization (EUA). This EUA will remain in effect (meaning this test can be used) for the duration of the COVID-19 declaration under Section 564(b)(1) of the Act, 21 U.S.C. section 360bbb-3(b)(1), unless the authorization is terminated or revoked.  Performed at [facility], [address], [place], [place] [address]     Radiology Studies: CT ABDOMEN PELVIS WO CONTRAST  Result Date: 09/01/2021 CLINICAL DATA:  Lower extremity edema, ascites, renal mass suspected EXAM: CT ABDOMEN AND PELVIS WITHOUT CONTRAST TECHNIQUE: Multidetector CT imaging of the abdomen and pelvis was performed following the standard protocol without IV contrast. COMPARISON:  Abdominal ultrasound, 09/01/2021 FINDINGS: Lower chest: Cardiomegaly. Coronary artery calcifications. Rounded subpleural opacity of the dependent right lower lobe, measuring 2.2 cm (series 3, image 9). Trace bilateral pleural effusions. Hepatobiliary: Coarse, nodular morphology of the liver. No solid liver abnormality is seen. Faintly calcified gallstones and sludge in the dependent gallbladder. Mild thickening of the gallbladder wall with pericholecystic fluid, nonspecific in the setting of ascites. Pancreas: Unremarkable. No pancreatic ductal dilatation or surrounding inflammatory changes. Spleen: Normal in size without significant abnormality. Adrenals/Urinary Tract: Adrenal glands are unremarkable. A suspected right upper pole renal mass is not clearly appreciated by noncontrast CT (series 6, image 41, series 5, image 66). Exophytic  cyst of the superior pole of the left kidney. No urinary tract calculi or hydronephrosis bladder wall thickening. Stomach/Bowel: Stomach is within normal limits. Appendix appears normal. No evidence of bowel wall thickening, distention, or inflammatory changes. Vascular/Lymphatic: Aortic atherosclerosis. No enlarged abdominal or pelvic lymph nodes. Reproductive: Mild prostatomegaly. Other: Severe anasarca. Small volume ascites throughout the abdomen and pelvis. Musculoskeletal: No acute or significant osseous findings. IMPRESSION: 1. A suspected right upper pole renal mass is not clearly appreciated by noncontrast CT. Noncontrast CT is of limited value for the detection and characterization of renal masses. Recommend multiphasic contrast enhanced CT or MRI on a nonemergent basis, when clinically appropriate for characterization. 2. Rounded subpleural opacity of the dependent right lower lobe, measuring 2.2 cm. This is nonspecific, however this  appearance suggests pulmonary infarction. Differential considerations include both airspace disease and parenchymal pulmonary nodule. Consider contrast enhanced CT angiogram of the chest to further evaluate. 3. Coarse, nodular morphology of the liver, suggestive of cirrhosis. 4. Small volume ascites throughout the abdomen and pelvis. 5. Severe anasarca. 6. Cholelithiasis. Mild thickening of the gallbladder wall with pericholecystic fluid, nonspecific in the setting of ascites. 7. Mild prostatomegaly. 8. Coronary artery disease. Aortic Atherosclerosis (ICD10-I70.0). Electronically Signed   By: Jearld Lesch M.D.   On: 09/01/2021 18:20      Gillis Santa, MD [facility]  If 7PM-7AM, please contact night-coverage [email or web address] 09/02/2021, 3:16 PM

## 2021-09-02 NOTE — Progress Notes (Signed)
OT Cancellation Note  Patient Details Name: Tyler Ochoa MRN: [id number] DOB: 1951/04/11   Cancelled Treatment:    Reason Eval/Treat Not Completed: Other (comment);Patient declined, no reason specified. OT continues to follow pt for acute therapy services. Spoke with physical therapist who attempted to see pt for tx session just prior to OT attempt. Per physical therapist, pt declining therapy services today and requests return at later date. Will continue to follow and re-attempt at a later date/time as available.   Rockney Ghee, M.S., OTR/L Ascom: [phone] 09/02/21, 2:32 PM

## 2021-09-02 NOTE — Consult Note (Signed)
Cardiology Consultation:   Patient ID: Tyler Ochoa MRN: [id number]; DOB: 1950-12-13  Admit date: 08/28/2021 Date of Consult: 09/02/2021  PCP:  Queen Slough, MD   [facility] Providers Cardiologist:  None   new to [facility]- [facility] rounding Click here to update MD or APP on Care Team, Refresh:1}     Patient Profile:   Tyler Ochoa is a 70 y.o. male with a hx of cardiomyopathy who is being seen 09/02/2021 for the evaluation of shortness of breath at the request of Dr.Kumar.  History of Present Illness:   Tyler Ochoa HFrEF, EF 20%, hypertension, former EtOH abuse, CKD presenting with worsening shortness of breath.  Patient previously followed with [facility]/Dr. Welton Flakes from a cardiac perspective.  He states wanting to switch providers.  He was initially diagnosed with congestive heart failure about a year ago when he developed edema.  Echo at that time showed EF of 15%.  He was placed on medical therapy, ischemic work-up via Myoview was recommended but patient declined states not being ready at the time.  He did not follow-up with primary cardiologist, states following up at the heart failure clinic at [facility].  Over the past week or so, he is noticed worsening shortness of breath and lower extremity edema also abdominal distention.  Upon admission, he was started on diuretics currently on Lasix drip.  Due to abdominal distention, ultrasound was obtained suspicious for liver cirrhosis.  States quitting drinking.  He recently drank about 24 packs on the weekends, sometimes packs daily during the week.  Denies smoking or illicit drug use.   Past Medical History:  Diagnosis Date   CHF (congestive heart failure) (HCC)    Chronic kidney disease    Hyperlipidemia    Hypertension    Peripheral edema     No past surgical history on file.   Home Medications:  Prior to Admission medications   Medication Sig Start Date End Date Taking? Authorizing Provider  acetaminophen  (TYLENOL) 500 MG tablet Take 500-1,000 mg by mouth every 6 (six) hours as needed for mild pain or moderate pain.   Yes [provider]  aspirin EC 81 MG tablet Take 1 tablet (81 mg total) by mouth daily. Swallow whole. 04/13/21  Yes Clarisa Kindred A, FNP  atorvastatin (LIPITOR) 80 MG tablet Take 1 tablet (80 mg total) by mouth at bedtime. 04/13/21  Yes Clarisa Kindred A, FNP  dapagliflozin propanediol (FARXIGA) 10 MG TABS tablet Take 1 tablet (10 mg total) by mouth daily before breakfast. 05/01/21  Yes Gollan, Tollie Pizza, MD  furosemide (LASIX) 40 MG tablet Take 1 tablet (40 mg total) by mouth daily. Patient taking differently: Take 20 mg by mouth daily. 04/13/21  Yes Clarisa Kindred A, FNP  metoprolol succinate (TOPROL XL) 50 MG 24 hr tablet Take 1 tablet (50 mg total) by mouth daily. Take with or immediately following a meal. 04/13/21 04/13/22 Yes Hackney, Inetta Fermo A, FNP  potassium chloride SA (KLOR-CON) 20 MEQ tablet Take 1 tablet (20 mEq total) by mouth daily. 04/13/21  Yes Hackney, Tina A, FNP  PROAIR HFA 108 (90 Base) MCG/ACT inhaler Inhale 1-2 puffs into the lungs every 6 (six) hours as needed for wheezing.   Yes [provider]  sacubitril-valsartan (ENTRESTO) 24-26 MG Take 1 tablet by mouth 2 (two) times daily. 04/14/21  Yes Delma Freeze, FNP    Inpatient Medications: Scheduled Meds:  aspirin EC  81 mg Oral Daily   atorvastatin  80 mg Oral QHS  dapagliflozin propanediol  10 mg Oral QAC breakfast   heparin injection (subcutaneous)  5,000 Units Subcutaneous Q8H   [START ON 09/03/2021] metoprolol succinate  12.5 mg Oral Q breakfast   potassium chloride SA  20 mEq Oral Daily   sodium bicarbonate  650 mg Oral TID   Continuous Infusions:  furosemide (LASIX) 200 mg in dextrose 5% 100 mL (2mg /mL) infusion 4 mg/hr (09/01/21 2029)   PRN Meds: acetaminophen **OR** acetaminophen, albuterol, guaiFENesin-dextromethorphan, magnesium hydroxide, ondansetron **OR** ondansetron (ZOFRAN) IV,  traZODone  Allergies:   No Known Allergies  Social History:   Social History   Socioeconomic History   Marital status: Single    Spouse name: Not on file   Number of children: Not on file   Years of education: Not on file   Highest education level: Not on file  Occupational History   Not on file  Tobacco Use   Smoking status: Never   Smokeless tobacco: Never  Vaping Use   Vaping Use: Never used  Substance and Sexual Activity   Alcohol use: Not on file   Drug use: Not on file   Sexual activity: Not on file  Other Topics Concern   Not on file  Social History Narrative   ** Merged History Encounter **       Social Determinants of Health   Financial Resource Strain: Not on file  Food Insecurity: Not on file  Transportation Needs: Not on file  Physical Activity: Not on file  Stress: Not on file  Social Connections: Not on file  Intimate Partner Violence: Not on file    Family History:   No family history on file.   ROS:  Please see the history of present illness.   All other ROS reviewed and negative.     Physical Exam/Data:   Vitals:   09/02/21 0723 09/02/21 1158 09/02/21 1520 09/02/21 1916  BP: 109/86 107/77 110/80 (!) 117/96  Pulse: 84 84 81 95  Resp:    18  Temp: 98 F (36.7 C) 98.2 F (36.8 C) 98.2 F (36.8 C) 98.2 F (36.8 C)  TempSrc:  Oral Oral   SpO2: 96% 99% 100% 96%  Weight:      Height:        Intake/Output Summary (Last 24 hours) at 09/02/2021 2006 Last data filed at 09/02/2021 1700 Gross per 24 hour  Intake 129.2 ml  Output 1950 ml  Net -1820.8 ml   Last 3 Weights 09/02/2021 09/01/2021 08/31/2021  Weight (lbs) 223 lb 11.2 oz 218 lb 4.1 oz 222 lb 10.6 oz  Weight (kg) 101.47 kg 99 kg 101 kg     Body mass index is 28.72 kg/m.  General:  Well nourished, soft-spoken HEENT: normal Neck: no JVD Vascular: No carotid bruits; Distal pulses 2+ bilaterally Cardiac:  normal S1, S2; RRR; no murmur  Lungs: Diminished breath sounds at  bases, Abd: soft, distended Ext: 1-2+ edema Musculoskeletal:  No deformities, BUE and BLE strength normal and equal Skin: warm and dry  Neuro:  CNs 2-12 intact, no focal abnormalities noted Psych:  Normal affect   EKG:  The EKG was personally reviewed and demonstrates: Sinus tachycardia, first-degree AV block Telemetry:  Telemetry was personally reviewed and demonstrates: Currently off telemetry  Relevant CV Studies: Echo 08/2021 EF 20%  Laboratory Data:  High Sensitivity Troponin:   Recent Labs  Lab 08/28/21 1506 08/28/21 2120  TROPONINIHS 420* 327*     Chemistry Recent Labs  Lab 08/31/21 1315 09/01/21 0418 09/02/21 0220  NA 134* 134* 132*  K 4.9 3.6 3.7  CL 105 106 105  CO2 18* 19* 19*  GLUCOSE 120* 110* 108*  BUN 60* 58* 51*  CREATININE 1.71* 1.57* 1.58*  CALCIUM 8.3* 8.2* 8.1*  MG  --  2.2 2.0  GFRNONAA 43* 48* 47*  ANIONGAP 11 9 8     Recent Labs  Lab 08/31/21 1315 09/01/21 0418 09/02/21 0220  PROT 6.3* 6.0* 6.3*  ALBUMIN 2.8* 2.5* 2.7*  AST 62* 57* 52*  ALT 60* 57* 50*  ALKPHOS 133* 130* 127*  BILITOT 4.4* 3.8* 3.7*   Lipids No results for input(s): CHOL, TRIG, HDL, LABVLDL, LDLCALC, CHOLHDL in the last 168 hours.  Hematology Recent Labs  Lab 08/29/21 0623 08/30/21 0448 09/02/21 0220  WBC 4.3 3.5* 3.4*  RBC 6.08* 5.54 4.98  HGB 17.2* 16.0 15.0  HCT 52.5* 46.4 41.6  MCV 86.3 83.8 83.5  MCH 28.3 28.9 30.1  MCHC 32.8 34.5 36.1*  RDW 18.4* 17.2* 17.2*  PLT 135* 139* 153   Thyroid No results for input(s): TSH, FREET4 in the last 168 hours.  BNP Recent Labs  Lab 08/28/21 1506 08/30/21 0448  BNP 3,231.8* 4,339.5*    DDimer No results for input(s): DDIMER in the last 168 hours.   Radiology/Studies:  CT ABDOMEN PELVIS WO CONTRAST  Result Date: 09/01/2021 CLINICAL DATA:  Lower extremity edema, ascites, renal mass suspected EXAM: CT ABDOMEN AND PELVIS WITHOUT CONTRAST TECHNIQUE: Multidetector CT imaging of the abdomen and pelvis was  performed following the standard protocol without IV contrast. COMPARISON:  Abdominal ultrasound, 09/01/2021 FINDINGS: Lower chest: Cardiomegaly. Coronary artery calcifications. Rounded subpleural opacity of the dependent right lower lobe, measuring 2.2 cm (series 3, image 9). Trace bilateral pleural effusions. Hepatobiliary: Coarse, nodular morphology of the liver. No solid liver abnormality is seen. Faintly calcified gallstones and sludge in the dependent gallbladder. Mild thickening of the gallbladder wall with pericholecystic fluid, nonspecific in the setting of ascites. Pancreas: Unremarkable. No pancreatic ductal dilatation or surrounding inflammatory changes. Spleen: Normal in size without significant abnormality. Adrenals/Urinary Tract: Adrenal glands are unremarkable. A suspected right upper pole renal mass is not clearly appreciated by noncontrast CT (series 6, image 41, series 5, image 66). Exophytic cyst of the superior pole of the left kidney. No urinary tract calculi or hydronephrosis bladder wall thickening. Stomach/Bowel: Stomach is within normal limits. Appendix appears normal. No evidence of bowel wall thickening, distention, or inflammatory changes. Vascular/Lymphatic: Aortic atherosclerosis. No enlarged abdominal or pelvic lymph nodes. Reproductive: Mild prostatomegaly. Other: Severe anasarca. Small volume ascites throughout the abdomen and pelvis. Musculoskeletal: No acute or significant osseous findings. IMPRESSION: 1. A suspected right upper pole renal mass is not clearly appreciated by noncontrast CT. Noncontrast CT is of limited value for the detection and characterization of renal masses. Recommend multiphasic contrast enhanced CT or MRI on a nonemergent basis, when clinically appropriate for characterization. 2. Rounded subpleural opacity of the dependent right lower lobe, measuring 2.2 cm. This is nonspecific, however this appearance suggests pulmonary infarction. Differential  considerations include both airspace disease and parenchymal pulmonary nodule. Consider contrast enhanced CT angiogram of the chest to further evaluate. 3. Coarse, nodular morphology of the liver, suggestive of cirrhosis. 4. Small volume ascites throughout the abdomen and pelvis. 5. Severe anasarca. 6. Cholelithiasis. Mild thickening of the gallbladder wall with pericholecystic fluid, nonspecific in the setting of ascites. 7. Mild prostatomegaly. 8. Coronary artery disease. Aortic Atherosclerosis (ICD10-I70.0). Electronically Signed   By: 09/03/2021 M.D.   On: 09/01/2021  18:20   US Abdomen Limited RUQ (LIVER/GB)  Result Date: 09/01/2021 CLINICAL DATA:  Abnormal liver function test EXAM: ULTRASOUND ABDOMEN LIMITED RIGHT UPPER QUADRANT COMPARISON:  None. FINDINGS: Gallbladder: Gallbladder sludge and nonshadowing but discrete appearing filling defects favoring calculi measuring up to 9 mm. Borderline gallbladder wall thickening but no pericholecystic edema or focal tenderness. Common bile duct: Diameter: 3 mm Liver: Suggested surface lobulation of the liver with large fissures. No focal liver mass. Portal vein is patent on color Doppler imaging with normal direction of blood flow towards the liver. Other: 3 cm lesion in the upper pole right kidney. Trace ascites in the abdomen. IMPRESSION: 1. Suspected liver cirrhosis.  Trace ascites. 2. Gallbladder sludge/calculi. 3. Right upper pole renal mass measuring 3.2 cm, not seen in February 2022. Recommend enhanced CT or MRI if possible. Electronically Signed   By: Tiburcio Pea M.D.   On: 09/01/2021 10:01     Assessment and Plan:   Cardiomyopathy EF 20% -Possibly nonischemic 2/2 previous heavy EtOH use. -Still volume overloaded -Continue Lasix drip, net -1.5 L over the past 24 hours -Monitor creatinine -Toprol-XL 12.5 mg daily, Farxiga. -Hold Entresto for now to give more BP room for diuresis.  Patient has CKD -Ischemic work-up/Lexiscan Myoview can be  considered as outpatient -avoiding contrast in light of renal dysfunction  2.  Abdominal ascites, -?  Cirrhosis -Abnormal LFTs -Management as per medicine team.  Consider GI input.  3.  CKD -Consider renal input.  Total encounter time 110 minutes  Greater than 50% was spent in counseling and coordination of care with the patient   Signed, Debbe Odea, MD  09/02/2021 8:06 PM

## 2021-09-03 DIAGNOSIS — I517 Cardiomegaly: Secondary | ICD-10-CM | POA: Diagnosis not present

## 2021-09-03 DIAGNOSIS — Z789 Other specified health status: Secondary | ICD-10-CM

## 2021-09-03 DIAGNOSIS — K7460 Unspecified cirrhosis of liver: Secondary | ICD-10-CM | POA: Diagnosis not present

## 2021-09-03 DIAGNOSIS — E8809 Other disorders of plasma-protein metabolism, not elsewhere classified: Secondary | ICD-10-CM | POA: Diagnosis not present

## 2021-09-03 DIAGNOSIS — R945 Abnormal results of liver function studies: Secondary | ICD-10-CM | POA: Diagnosis not present

## 2021-09-03 DIAGNOSIS — R531 Weakness: Secondary | ICD-10-CM | POA: Diagnosis not present

## 2021-09-03 DIAGNOSIS — E8770 Fluid overload, unspecified: Secondary | ICD-10-CM | POA: Diagnosis not present

## 2021-09-03 DIAGNOSIS — I5020 Unspecified systolic (congestive) heart failure: Secondary | ICD-10-CM | POA: Diagnosis not present

## 2021-09-03 DIAGNOSIS — I420 Dilated cardiomyopathy: Secondary | ICD-10-CM

## 2021-09-03 LAB — COMPREHENSIVE METABOLIC PANEL
ALT: 48 U/L — ABNORMAL HIGH (ref 0–44)
AST: 53 U/L — ABNORMAL HIGH (ref 15–41)
Albumin: 2.6 g/dL — ABNORMAL LOW (ref 3.5–5.0)
Alkaline Phosphatase: 118 U/L (ref 38–126)
Anion gap: 6 (ref 5–15)
BUN: 47 mg/dL — ABNORMAL HIGH (ref 8–23)
CO2: 21 mmol/L — ABNORMAL LOW (ref 22–32)
Calcium: 8 mg/dL — ABNORMAL LOW (ref 8.9–10.3)
Chloride: 108 mmol/L (ref 98–111)
Creatinine, Ser: 1.63 mg/dL — ABNORMAL HIGH (ref 0.61–1.24)
GFR, Estimated: 46 mL/min — ABNORMAL LOW (ref >60)
Glucose, Bld: 111 mg/dL — ABNORMAL HIGH (ref 70–99)
Potassium: 3.7 mmol/L (ref 3.5–5.1)
Sodium: 135 mmol/L (ref 135–145)
Total Bilirubin: 3.9 mg/dL — ABNORMAL HIGH (ref 0.3–1.2)
Total Protein: 6.1 g/dL — ABNORMAL LOW (ref 6.5–8.1)

## 2021-09-03 LAB — AMMONIA: Ammonia: 22 umol/L (ref 9–35)

## 2021-09-03 LAB — CBC
HCT: 43.4 % (ref 39.0–52.0)
Hemoglobin: 15.2 g/dL (ref 13.0–17.0)
MCH: 29.1 pg (ref 26.0–34.0)
MCHC: 35 g/dL (ref 30.0–36.0)
MCV: 83.1 fL (ref 80.0–100.0)
Platelets: 160 10*3/uL (ref 150–400)
RBC: 5.22 MIL/uL (ref 4.22–5.81)
RDW: 17.1 % — ABNORMAL HIGH (ref 11.5–15.5)
WBC: 4.1 10*3/uL (ref 4.0–10.5)
nRBC: 0 % (ref 0.0–0.2)

## 2021-09-03 LAB — MAGNESIUM: Magnesium: 2.1 mg/dL (ref 1.7–2.4)

## 2021-09-03 LAB — PHOSPHORUS: Phosphorus: 2.5 mg/dL (ref 2.5–4.6)

## 2021-09-03 MED ORDER — ENSURE ENLIVE PO LIQD
237.0000 mL | Freq: Three times a day (TID) | ORAL | Status: DC
  Administered 2021-09-03 – 2021-09-09 (×15): 237 mL via ORAL

## 2021-09-03 NOTE — Progress Notes (Signed)
Occupational Therapy Treatment Patient Details Name: Tyler Ochoa MRN: [id number] DOB: 16-Dec-1950 Today's Date: 09/03/2021   History of present illness Pt is a 70 y/o M with PMH: HTN, HLD, HFrRF, and renal insufficiency who presented to the ED d/t wrosening SOB and weakness with concerns for fluid overload from HF clinic. Pt with h/o noncompliance including not f/u with cardiologist and not getting cardiac testing after last hospitalization in April 2022. Cardiology consult reports that increased troponin is 2/2 CHF.   OT comments  Pt seen for OT tx this date to f/u re: safety with ADLs/ADL mobility. OT engages pt in sup to sit with CGA and pt demos G sitting balance. He requires MOD A for LB dressing and MIN A for attempted standing voiding with urinal. Pt completes 2 standing trials with RW with cues for safe hand placement and MIN A on both trials. On second trial, OT encourages pt to take ~4-5 side steps (to his R) toward [facility] to optimize positioning. Pt wants to stay seated for lunch and RN aware he is seated EOB (refuses to get to chair). Bed alarm activated and all needs met and in reach. Will continue to follow acutely. Pt with limited participation in therapy since date of evaluation and has not progressed much. He shows good potential to possibly go home with home health if he progresses with ADL transfers and mobility to safe and prudent level for home environment. At this time, still requiring assist with transfers and still with significantly decreased fxl activity tolerance, so d/c recommendation remains SNF.    Recommendations for follow up therapy are one component of a multi-disciplinary discharge planning process, led by the attending physician.  Recommendations may be updated based on patient status, additional functional criteria and insurance authorization.    Follow Up Recommendations  Skilled nursing-short term rehab (<3 hours/day)    Assistance Recommended at Discharge     Equipment Recommendations  BSC;Tub/shower seat;Other (comment) (2ww)    Recommendations for Other Services      Precautions / Restrictions Precautions Precautions: Fall Restrictions Weight Bearing Restrictions: No       Mobility Bed Mobility Overal bed mobility: Needs Assistance Bed Mobility: Supine to Sit     Supine to sit: Min guard;HOB elevated     General bed mobility comments: increased time, use of bed rails, slight assist to scoot    Transfers Overall transfer level: Needs assistance Equipment used: Rolling walker (2 wheels) Transfers: Sit to/from Stand Sit to Stand: Min assist           General transfer comment: 2 STS trials with RW, cues for hand placement both times showing poor carryover. Second stand completed so that pt could take 4-5 side steps at EOB to optimize positioning for when he tries to return to bed after meal (pt left sitting EOB to eat lunch, RN aware)     Balance Overall balance assessment: Needs assistance Sitting-balance support: Feet supported;No upper extremity supported Sitting balance-Leahy Scale: Good     Standing balance support: Bilateral upper extremity supported;During functional activity Standing balance-Leahy Scale: Fair Standing balance comment: F static, P dynamic standing. Able to sustain static with SBA/CGA with RW for UE support, able to alternate hands from RW in static standing for ADLs such as holding urinal to void                           ADL either performed or assessed with clinical judgement  ADL Overall ADL's : Needs assistance/impaired                     Lower Body Dressing: Moderate assistance;Sitting/lateral leans Lower Body Dressing Details (indicate cue type and reason): OT initiates for pt while pt in EOB sitting. He does have poor ROM in knees at baseline, but with extended time and external rotation technique, he is able to prop each LE, one-at-a-time next to him in bed to  finish pulling socks up.     Toileting- Clothing Manipulation and Hygiene: Minimal assistance;Sit to/from stand Toileting - Clothing Manipulation Details (indicate cue type and reason): standing with RW to hold urinal. Pt is successful in placing it initially, but then states he needs to do it sitting. MIN A to return to sitting and pt completes task with SETUP.     Functional mobility during ADLs: Minimal assistance;Rolling walker (2 wheels) (to take ~4-5 small side steps (to his R) at bed side to improve positioning for when he decides to return to bed as he is close to FOB after first stand)       Vision Patient Visual Report: No change from baseline     Perception     Praxis      Cognition Arousal/Alertness: Awake/alert Behavior During Therapy: WFL for tasks assessed/performed;Flat affect Overall Cognitive Status: Impaired/Different from baseline                                 General Comments: unclear if pt is more confused this date, or perhaps if some underlying confusion was present all along, but he reports that he is "70 years old" today and OT has to show him on paper that his actual age is 6 considering he was born in [birth year]. Even then, pt is reluctant to believe he is 70.          Exercises Other Exercises Other Exercises: OT engages pt in seated and standing ADLs as well as two standing trials with MIN A with RW.   Shoulder Instructions       General Comments      Pertinent Vitals/ Pain       Pain Assessment: 0-10 Pain Score: 4  Pain Location: BLE knees, increases with mobility, chronic Pain Descriptors / Indicators: Aching;Sore Pain Intervention(s): Limited activity within patient's tolerance;Monitored during session;Repositioned  Home Living                                          Prior Functioning/Environment              Frequency  Min 1X/week        Progress Toward Goals  OT Goals(current goals can now  be found in the care plan section)  Progress towards OT goals: Progressing toward goals  Acute Rehab OT Goals OT Goal Formulation: With patient Time For Goal Achievement: 09/12/21 Potential to Achieve Goals: Good  Plan Discharge plan remains appropriate    Co-evaluation                 AM-PAC OT "6 Clicks" Daily Activity     Outcome Measure   Help from another person eating meals?: None Help from another person taking care of personal grooming?: None Help from another person toileting, which includes using toliet, bedpan, or urinal?: A Lot  Help from another person bathing (including washing, rinsing, drying)?: A Lot Help from another person to put on and taking off regular upper body clothing?: A Little Help from another person to put on and taking off regular lower body clothing?: A Lot 6 Click Score: 17    End of Session Equipment Utilized During Treatment: Gait belt;Rolling walker (2 wheels)  OT Visit Diagnosis: Unsteadiness on feet (R26.81);Muscle weakness (generalized) (M62.81);Pain Pain - part of body: Knee   Activity Tolerance Patient tolerated treatment well   Patient Left Other (comment) (seated EOB to eat lunch, bed alarm activated, RN aware)   Nurse Communication          Time: 1580-6386 OT Time Calculation (min): 18 min  Charges: OT General Charges $OT Visit: 1 Visit OT Treatments $Self Care/Home Management : 8-22 mins  Gerrianne Scale, Schleicher, OTR/L ascom [phone] 09/03/21, 7:27 PM

## 2021-09-03 NOTE — Care Management Important Message (Signed)
Important Message  Patient Details  Name: Tyler Ochoa MRN: [id number] Date of Birth: 1951-03-26   Medicare Important Message Given:  Yes     [name] 09/03/2021, 3:15 PM

## 2021-09-03 NOTE — Progress Notes (Signed)
PT Cancellation Note  Patient Details Name: CLARE CASTO MRN: [id number] DOB: 12-04-1950   Cancelled Treatment:     PT attempt. 2nd attempt today. Attempted to see pt ~ 1 hour prior however pt visiting with family and requesting Thereasa Parkin return in a hour.He did participate in OT earlier this date. Upon returning (~ 1 hour later), Pt states he just had long palliative care discussion and is " not in the mood right now." Requested author return tomorrow. Acute PT will continue efforts to progress pt to PLOF. As requested, PT will return tomorrow.   Rushie Chestnut 09/03/2021, 3:55 PM

## 2021-09-03 NOTE — Progress Notes (Signed)
Palliative Care Progress Note, Assessment & Plan   Patient Name: Tyler Ochoa       Date: 09/03/2021 DOB: [birth year]  Age: 70 y.o. MRN#: [id number] Attending Physician: Val Riles, MD Primary Care Physician: Manson Allan, MD Admit Date: 08/28/2021  Reason for Consultation/Follow-up: Establishing goals of care  Subjective: Patient is resting in bed in no apparent distress.  He is being visited by his Sister Kalman Shan and his brother-in-law.  He has no acute complaints.  HPI: 70 y.o. male  with past medical history of congestive heart failure, chronic edema, osteoarthritis, hypertension, hyperlipidemia, stage II wound on buttock present prior to admission, and a decreased ejection fracture of less than 20% (down from March 2022 when it was 25 to 30%), and severe concentric left ventricular hypertrophy. Admitted on 08/28/2021 with generalized weakness, dry cough, bilateral lower extremity edema, and orthopnea.   Palliative medicine was consulted for goals of care discussion.  Plan of Care: I have reviewed medical records including EPIC notes, labs and imaging, received report from nursing, assessed the patient and then met with patient, his sister, and his brother in law at bedside  to discuss diagnosis prognosis, GOC, EOL wishes, disposition and options.  I introduced Palliative Medicine to his sister and brother in law as specialized medical care for people living with serious illness. It focuses on providing relief from the symptoms and stress of a serious illness. The goal is to improve quality of life for both the patient and the family.  We discussed patient's current illness and what it means in the larger context of patient's on-going co-morbidities.  The patient recalled our discussions earlier  regarding the "what if?"  Of his health.  I again highlighted how sick his heart is and the difficulty it would have should it need to undergo cardiopulmonary resuscitation and or ventilator/life support.  Patient shared he is willing to do whatever the doctors say he should do.  I highlighted that everything the doctors and medical team suggest our recommendations.  I asked what his plan of care is in the goals and outcomes he would like to see from his medical team.  He said he would like to just get better.  I again reviewed the difference between DNR and allowing a natural death.  I reiterated the details of what a CODE BLUE and cardiopulmonary resuscitation entails.  The patient again stated he would like to keep things as they are and remain a full code.  With the patient's permission I answered the sisters questions regarding the patient's current health.  She would like to know what options the patient has from a cardiology standpoint.  I shared that cardiology is following the patient and that she and the patient should feel free to ask the cardiologist these questions.  Discussed with patient/family the importance of continued conversation with family and the medical providers regarding overall plan of care and treatment options, ensuring decisions are within the context of the patient's values and GOCs.    Questions and concerns were addressed. The family was encouraged to call with questions or concerns.   Code Status: Full code  Prognosis:  Unable to determine  Discharge Planning: To Be Determined  Recommendations/Plan: Remain full code Treat the treatable Patient would like to know what options he has from cardiology as far as defibrillator or other medical interventions to prolong his life  Care plan was discussed with patient, patient's sister, patient's brother in law  Length of Stay: 6  Physical Exam Constitutional:      Appearance: Normal appearance. He is normal  weight.  HENT:     Head: Normocephalic and atraumatic.     Mouth/Throat:     Mouth: Mucous membranes are moist.  Eyes:     Pupils: Pupils are equal, round, and reactive to light.  Cardiovascular:     Rate and Rhythm: Normal rate.     Pulses: Normal pulses.  Pulmonary:     Effort: Pulmonary effort is normal.  Abdominal:     Palpations: Abdomen is soft.  Skin:    General: Skin is warm and dry.  Neurological:     Mental Status: He is alert. Mental status is at baseline.  Psychiatric:        Mood and Affect: Mood normal.        Behavior: Behavior normal.        Thought Content: Thought content normal.        Judgment: Judgment normal.            Vital Signs: BP 118/85   Pulse 95   Temp 98.5 F (36.9 C) (Oral)   Resp 20   Ht _0  (1.88 m)   Wt 100.4 kg   SpO2 96%   BMI 28.43 kg/m  SpO2: SpO2: 96 % O2 Device: O2 Device: Room Air O2 Flow Rate:        Palliative Assessment/Data: 60%       Total Time 25 minutes Prolonged Time Billed  no   Greater than 50%  of this time was spent counseling and coordinating care related to the above assessment and plan.  Thank you for allowing the Palliative Medicine Team to assist in the care of this patient.  [place] Ilsa Iha, FNP-BC Palliative Medicine Team Team Phone # [phone]

## 2021-09-03 NOTE — Progress Notes (Signed)
PROGRESS NOTE  Tyler Ochoa OZD:[id number] DOB: [birth year]   PCP: Queen Slough, MD  Patient is from:.  [place] rolling walker and cane.  Lives with his girlfriend.  DOA: 08/28/2021 LOS: 6  Chief complaints:  Chief Complaint  Patient presents with   Weakness   Ascites     Brief Narrative / Interim history: 70 year old male with PMH of systolic CHF, chronic edema, osteoarthritis, debility, HTN and HLD presenting with generalized weakness, dry cough, DOE, BLE edema and orthopnea, and admitted with acute on chronic systolic CHF, possible non-STEMI and AKI.  BNP elevated to 3200.  Troponin elevated to 420 and improved to 327.  Started on IV Lasix and IV heparin.  Cardiology consulted.  TTE with LVEF <20% (25 to 30% in 01/2021), G3 DD and moderate RAE.  No further cardiac or pulmonary evaluation per cardiologist.  Therapy recommended SNF.  Subjective: No significant event overnight, patient was resting comfortably, denied any worsening of shortness of breath, patient still has significant edema in the lower extremities, feels little improvement.   Denies any chest pain or palpitations, no any other active issues.   Objective: Vitals:   09/02/21 2331 09/03/21 0404 09/03/21 0718 09/03/21 1105  BP: 111/82 101/74 (!) 124/96 118/85  Pulse: (!) 101 91 88 95  Resp: 18 18 20 20   Temp: 98.3 F (36.8 C) 98.4 F (36.9 C) 98.4 F (36.9 C) 98.5 F (36.9 C)  TempSrc:   Oral Oral  SpO2: 98% 96% 97% 96%  Weight:  100.4 kg    Height:        Intake/Output Summary (Last 24 hours) at 09/03/2021 1308 Last data filed at 09/03/2021 0411 Gross per 24 hour  Intake 60 ml  Output 1325 ml  Net -1265 ml   Filed Weights   09/01/21 0500 09/02/21 0616 09/03/21 0404  Weight: 99 kg 101.5 kg 100.4 kg    Examination:  GENERAL: No apparent distress.  Nontoxic. HEENT: MMM.  Vision and hearing grossly intact.  NECK: Supple.  No apparent JVD.  RESP: ctab save for mild rales at bases CVS:  RRR.  Heart sounds normal.  ABD/GI/GU: BS+. Abd soft, mildly distended due to ascites, nontender.  MSK/EXT:  Moves extremities. No apparent deformity.  3-4+ BLE edema, right> left SKIN: no apparent skin lesion or wound NEURO: Awake and alert. Oriented appropriately.  No apparent focal neuro deficit. PSYCH: Calm. Normal affect.   Procedures:  None  Microbiology summarized: COVID-19 and influenza PCR nonreactive.  Assessment & Plan: Acute on chronic combined CHF: TTE with LVEF of <20% (25 to 30% in 01/2021), G3 DD and moderate RAE.  Reportedly had generalized weakness, DOE, 3 pillow orthopnea and BLE edema on presentation although  patient denies significant acute change.  BNP 3200>> 4400.  CXR with stable cardiomegaly.  Elevated troponin likely from demand ischemia as well. -No further cardiac evaluation from cardiology standpoint, needs outpt f/u with dr. 02/2021 -s/p IV Lasix 40 mg daily, it was decreased due to elevated creatinine.  10/25 started Lasix IV infusion for gradual continuous diuresis.   -GDM, pt was on Entresto which was held due to low bp,  continue Farxiga, decrease Toprol-XL from 50 to 12.5 mg p.o. daily due to low blood pressure -Monitor fluid status, renal functions and electrolytes. -low Sodium diet and fluid restriction 1.5 L per day. -Palliative medicine consulted for goal of care discussion. Patient was seen by cardiology Dr. 11-12-2002, patient is noncompliant with follow-ups, patient wants to switch cardiologist, spoke to Dr.  Welton Flakes and he is okay with that. Discussed with cardiologist [facility] who will follow patient and will schedule appointment as an outpatient as well.     Liver cirrhosis with ascites found on CT abdomen pelvis,  Unknown cause of liver cirrhosis could be due to congestion secondary to CHF. Continue diuretic as above  ammonia level wnl   Elevated troponin without significant delta: Likely demand ischemia.  No RWMA on TTE. -Continue statin, aspirin and  beta-blocker  AKI/azotemia: Cardiorenal?  Base creatinine appears to be mid 1s, up to 1.57 today -Continue monitoring - diuresis as above  Elevated liver enzymes/hyperbilirubinemia/coagulopathy: CK within normal.  Denies EtOH.  Likely congestive hepatopathy as improving w/ diuresis. Appears to have hx of elevated bilirubin Recent Labs  Lab 08/30/21 0448 08/31/21 1315 09/01/21 0418 09/02/21 0220 09/03/21 0412  AST 72* 62* 57* 52* 53*  ALT 74* 60* 57* 50* 48*  ALKPHOS 143* 133* 130* 127* 118  BILITOT 4.3* 4.4* 3.8* 3.7* 3.9*  PROT 6.4* 6.3* 6.0* 6.3* 6.1*  ALBUMIN 2.7*  2.8* 2.8* 2.5* 2.7* 2.6*  -Continue monitoring LFTs - RUQ u/s shows suspected liver cirrhosis, mild ascites and right renal mass, recommended CT or MRI CT scan abdomen pelvis shows liver cirrhosis, ascites could not rule out renal mass due to noncontrast study, recommended CT scan with contrast versus MRI with contrast.   Essential hypertension: Normotensive. -Cardiac meds as above  Hyperlipidemia:  -Continue statin.  Ambulatory dysfunction/knee osteoarthritis -Therapy recommended SNF, patient interested, toc to pursue  BLE edema, due to chf and Liver cirrhosis . Dopplers neg for dvt  Thrombocytopenia: Stable, resolved Recent Labs  Lab 08/28/21 1506 08/29/21 0623 08/30/21 0448 09/02/21 0220 09/03/21 0412  PLT 133* 135* 139* 153 160  -Continue monitoring  Right renal mass, patient was recommended to follow with PCP to repeat CT abdomen pelvis with contrast when renal functions improved versus MRI as an outpatient and follow-up with urology as an outpatient   Goal of care counseling: Patient with advanced CHF and other comorbidity as above.  Poor long-term prognosis.  Hospice candidate.  However, patient still full code and prefers to remain full code despite risk and benefit discussion.  -Palliative medicine consulted.  Body mass index is 28.02 kg/m.   Pressure Injury 08/29/21 Buttocks Medial  Stage 2 -  Partial thickness loss of dermis presenting as a shallow open injury with a red, pink wound bed without slough. (Active)  08/29/21 2109  Location: Buttocks  Location Orientation: Medial  Staging: Stage 2 -  Partial thickness loss of dermis presenting as a shallow open injury with a red, pink wound bed without slough.  Wound Description (Comments):   Present on Admission: Yes   DVT prophylaxis:  Place TED hose Start: 09/01/21 1055 heparin injection 5,000 Units Start: 08/29/21 2200  Code Status: Full code Family Communication: Patient and/or RN. Available if any question.  Level of care: Progressive Cardiac Status is: Inpatient  Remains inpatient appropriate because: Safe disposition/SNF    Consultants:  Cardiology   Sch Meds:  Scheduled Meds:  aspirin EC  81 mg Oral Daily   atorvastatin  80 mg Oral QHS   dapagliflozin propanediol  10 mg Oral QAC breakfast   heparin injection (subcutaneous)  5,000 Units Subcutaneous Q8H   metoprolol succinate  12.5 mg Oral Q breakfast   potassium chloride SA  20 mEq Oral Daily   Continuous Infusions:  furosemide (LASIX) 200 mg in dextrose 5% 100 mL (2mg /mL) infusion 4 mg/hr (09/01/21 2029)  PRN Meds:.acetaminophen **OR** acetaminophen, albuterol, guaiFENesin-dextromethorphan, magnesium hydroxide, ondansetron **OR** ondansetron (ZOFRAN) IV, traZODone  Antimicrobials: Anti-infectives (From admission, onward)    None        I have personally reviewed the following labs and images: CBC: Recent Labs  Lab 08/28/21 1506 08/29/21 0623 08/30/21 0448 09/02/21 0220 09/03/21 0412  WBC 3.8* 4.3 3.5* 3.4* 4.1  NEUTROABS 2.7  --   --   --   --   HGB 16.7 17.2* 16.0 15.0 15.2  HCT 47.2 52.5* 46.4 41.6 43.4  MCV 86.1 86.3 83.8 83.5 83.1  PLT 133* 135* 139* 153 160   BMP &GFR Recent Labs  Lab 08/30/21 0448 08/31/21 1315 09/01/21 0418 09/02/21 0220 09/03/21 0412  NA 135 134* 134* 132* 135  K 4.2 4.9 3.6 3.7 3.7  CL  108 105 106 105 108  CO2 18* 18* 19* 19* 21*  GLUCOSE 90 120* 110* 108* 111*  BUN 65* 60* 58* 51* 47*  CREATININE 1.90* 1.71* 1.57* 1.58* 1.63*  CALCIUM 8.4* 8.3* 8.2* 8.1* 8.0*  MG  --   --  2.2 2.0 2.1  PHOS 4.1  --  2.7 2.7 2.5   Estimated Creatinine Clearance: 54.9 mL/min (A) (by C-G formula based on SCr of 1.63 mg/dL (H)). Liver & Pancreas: Recent Labs  Lab 08/30/21 0448 08/31/21 1315 09/01/21 0418 09/02/21 0220 09/03/21 0412  AST 72* 62* 57* 52* 53*  ALT 74* 60* 57* 50* 48*  ALKPHOS 143* 133* 130* 127* 118  BILITOT 4.3* 4.4* 3.8* 3.7* 3.9*  PROT 6.4* 6.3* 6.0* 6.3* 6.1*  ALBUMIN 2.7*  2.8* 2.8* 2.5* 2.7* 2.6*   No results for input(s): LIPASE, AMYLASE in the last 168 hours. Recent Labs  Lab 09/02/21 1527 09/03/21 0412  AMMONIA 30 22   Diabetic: No results for input(s): HGBA1C in the last 72 hours. No results for input(s): GLUCAP in the last 168 hours. Cardiac Enzymes: Recent Labs  Lab 08/30/21 0448  CKTOTAL 173   No results for input(s): PROBNP in the last 8760 hours. Coagulation Profile: Recent Labs  Lab 08/29/21 0108  INR 1.8*   Thyroid Function Tests: No results for input(s): TSH, T4TOTAL, FREET4, T3FREE, THYROIDAB in the last 72 hours. Lipid Profile: No results for input(s): CHOL, HDL, LDLCALC, TRIG, CHOLHDL, LDLDIRECT in the last 72 hours. Anemia Panel: No results for input(s): VITAMINB12, FOLATE, FERRITIN, TIBC, IRON, RETICCTPCT in the last 72 hours. Urine analysis:    Component Value Date/Time   COLORURINE YELLOW (A) 03/07/2021 1705   APPEARANCEUR CLEAR (A) 03/07/2021 1705   LABSPEC 1.010 03/07/2021 1705   PHURINE 5.0 03/07/2021 1705   GLUCOSEU >=500 (A) 03/07/2021 1705   HGBUR SMALL (A) 03/07/2021 1705   BILIRUBINUR NEGATIVE 03/07/2021 1705   KETONESUR NEGATIVE 03/07/2021 1705   PROTEINUR 30 (A) 03/07/2021 1705   NITRITE NEGATIVE 03/07/2021 1705   LEUKOCYTESUR NEGATIVE 03/07/2021 1705   Sepsis Labs: Invalid input(s): PROCALCITONIN,  LACTICIDVEN  Microbiology: Recent Results (from the past 240 hour(s))  Resp Panel by RT-PCR (Flu A&B, Covid) Nasopharyngeal Swab     Status: None   Collection Time: 08/28/21  9:43 PM   Specimen: Nasopharyngeal Swab; Nasopharyngeal(NP) swabs in vial transport medium  Result Value Ref Range Status   SARS Coronavirus 2 by RT PCR NEGATIVE NEGATIVE Final    Comment: (NOTE) SARS-CoV-2 target nucleic acids are NOT DETECTED.  The SARS-CoV-2 RNA is generally detectable in upper respiratory specimens during the acute phase of infection. The lowest concentration of SARS-CoV-2 viral copies this  assay can detect is 138 copies/mL. A negative result does not preclude SARS-Cov-2 infection and should not be used as the sole basis for treatment or other patient management decisions. A negative result may occur with  improper specimen collection/handling, submission of specimen other than nasopharyngeal swab, presence of viral mutation(s) within the areas targeted by this assay, and inadequate number of viral copies(<138 copies/mL). A negative result must be combined with clinical observations, patient history, and epidemiological information. The expected result is Negative.  Fact Sheet for Patients:  BloggerCourse.com  Fact Sheet for Healthcare Providers:  SeriousBroker.it  This test is no t yet approved or cleared by the [place] FDA and  has been authorized for detection and/or diagnosis of SARS-CoV-2 by FDA under an Emergency Use Authorization (EUA). This EUA will remain  in effect (meaning this test can be used) for the duration of the COVID-19 declaration under Section 564(b)(1) of the Act, 21 U.S.C.section 360bbb-3(b)(1), unless the authorization is terminated  or revoked sooner.       Influenza A by PCR NEGATIVE NEGATIVE Final   Influenza B by PCR NEGATIVE NEGATIVE Final    Comment: (NOTE) The Xpert Xpress SARS-CoV-2/FLU/RSV plus  assay is intended as an aid in the diagnosis of influenza from Nasopharyngeal swab specimens and should not be used as a sole basis for treatment. Nasal washings and aspirates are unacceptable for Xpert Xpress SARS-CoV-2/FLU/RSV testing.  Fact Sheet for Patients: BloggerCourse.com  Fact Sheet for Healthcare Providers: SeriousBroker.it  This test is not yet approved or cleared by the [place] FDA and has been authorized for detection and/or diagnosis of SARS-CoV-2 by FDA under an Emergency Use Authorization (EUA). This EUA will remain in effect (meaning this test can be used) for the duration of the COVID-19 declaration under Section 564(b)(1) of the Act, 21 U.S.C. section 360bbb-3(b)(1), unless the authorization is terminated or revoked.  Performed at [facility], [address] [place], [place] [address]     Radiology Studies: No results found.    Gillis Santa, MD [facility]  If 7PM-7AM, please contact night-coverage [email or web address] 09/03/2021, 1:08 PM

## 2021-09-03 NOTE — Progress Notes (Signed)
Progress Note  Patient Name: Tyler Ochoa Date of Encounter: 09/03/2021  [facility] Cardiologist: Agbor-etang  Subjective   Still with significant lower extremity edema, extending into his thighs, abdominal distention Wife at bedside Long discussion concerning recent events Worsening abdominal swelling as outpatient, came into the hospital when leg swelling started getting worse He has not been eating well at home, Previously with very heavy alcohol use but more recently in the setting of not feeling well 1 beer per week Nutrition has been poor in the setting of abdominal distention  Inpatient Medications    Scheduled Meds:  aspirin EC  81 mg Oral Daily   atorvastatin  80 mg Oral QHS   dapagliflozin propanediol  10 mg Oral QAC breakfast   heparin injection (subcutaneous)  5,000 Units Subcutaneous Q8H   metoprolol succinate  12.5 mg Oral Q breakfast   potassium chloride SA  20 mEq Oral Daily   Continuous Infusions:  furosemide (LASIX) 200 mg in dextrose 5% 100 mL (2mg /mL) infusion 4 mg/hr (09/01/21 2029)   PRN Meds: acetaminophen **OR** acetaminophen, albuterol, guaiFENesin-dextromethorphan, magnesium hydroxide, ondansetron **OR** ondansetron (ZOFRAN) IV, traZODone   Vital Signs    Vitals:   09/02/21 1916 09/02/21 2331 09/03/21 0404 09/03/21 0718  BP: (!) 117/96 111/82 101/74 (!) 124/96  Pulse: 95 (!) 101 91 88  Resp: 18 18 18 20   Temp: 98.2 F (36.8 C) 98.3 F (36.8 C) 98.4 F (36.9 C) 98.4 F (36.9 C)  TempSrc:    Oral  SpO2: 96% 98% 96% 97%  Weight:   100.4 kg   Height:        Intake/Output Summary (Last 24 hours) at 09/03/2021 0935 Last data filed at 09/03/2021 0411 Gross per 24 hour  Intake 60 ml  Output 1325 ml  Net -1265 ml   Last 3 Weights 09/03/2021 09/02/2021 09/01/2021  Weight (lbs) 221 lb 6.4 oz 223 lb 11.2 oz 218 lb 4.1 oz  Weight (kg) 100.426 kg 101.47 kg 99 kg      Telemetry    Normal sinus rhythm, PVCs- Personally  Reviewed  ECG     - Personally Reviewed  Physical Exam   GEN: No acute distress.   Neck:  JVD 8+ Cardiac: RRR, no murmurs, rubs, or gallops.  Trace to 1+ pitting lateral thighs, 1+ pitting below the knees bilaterally Respiratory: Clear to auscultation bilaterally. GI: Soft, nontender, distended  MS: No edema; No deformity. Neuro:  Nonfocal  Psych: Normal affect   Labs    High Sensitivity Troponin:   Recent Labs  Lab 08/28/21 1506 08/28/21 2120  TROPONINIHS 420* 327*     Chemistry Recent Labs  Lab 09/01/21 0418 09/02/21 0220 09/03/21 0412  NA 134* 132* 135  K 3.6 3.7 3.7  CL 106 105 108  CO2 19* 19* 21*  GLUCOSE 110* 108* 111*  BUN 58* 51* 47*  CREATININE 1.57* 1.58* 1.63*  CALCIUM 8.2* 8.1* 8.0*  MG 2.2 2.0 2.1  PROT 6.0* 6.3* 6.1*  ALBUMIN 2.5* 2.7* 2.6*  AST 57* 52* 53*  ALT 57* 50* 48*  ALKPHOS 130* 127* 118  BILITOT 3.8* 3.7* 3.9*  GFRNONAA 48* 47* 46*  ANIONGAP 9 8 6     Lipids No results for input(s): CHOL, TRIG, HDL, LABVLDL, LDLCALC, CHOLHDL in the last 168 hours.  Hematology Recent Labs  Lab 08/30/21 0448 09/02/21 0220 09/03/21 0412  WBC 3.5* 3.4* 4.1  RBC 5.54 4.98 5.22  HGB 16.0 15.0 15.2  HCT 46.4 41.6 43.4  MCV 83.8 83.5 83.1  MCH 28.9 30.1 29.1  MCHC 34.5 36.1* 35.0  RDW 17.2* 17.2* 17.1*  PLT 139* 153 160   Thyroid No results for input(s): TSH, FREET4 in the last 168 hours.  BNP Recent Labs  Lab 08/28/21 1506 08/30/21 0448  BNP 3,231.8* 4,339.5*    DDimer No results for input(s): DDIMER in the last 168 hours.   Radiology    CT ABDOMEN PELVIS WO CONTRAST  Result Date: 09/01/2021 CLINICAL DATA:  Lower extremity edema, ascites, renal mass suspected EXAM: CT ABDOMEN AND PELVIS WITHOUT CONTRAST TECHNIQUE: Multidetector CT imaging of the abdomen and pelvis was performed following the standard protocol without IV contrast. COMPARISON:  Abdominal ultrasound, 09/01/2021 FINDINGS: Lower chest: Cardiomegaly. Coronary artery  calcifications. Rounded subpleural opacity of the dependent right lower lobe, measuring 2.2 cm (series 3, image 9). Trace bilateral pleural effusions. Hepatobiliary: Coarse, nodular morphology of the liver. No solid liver abnormality is seen. Faintly calcified gallstones and sludge in the dependent gallbladder. Mild thickening of the gallbladder wall with pericholecystic fluid, nonspecific in the setting of ascites. Pancreas: Unremarkable. No pancreatic ductal dilatation or surrounding inflammatory changes. Spleen: Normal in size without significant abnormality. Adrenals/Urinary Tract: Adrenal glands are unremarkable. A suspected right upper pole renal mass is not clearly appreciated by noncontrast CT (series 6, image 41, series 5, image 66). Exophytic cyst of the superior pole of the left kidney. No urinary tract calculi or hydronephrosis bladder wall thickening. Stomach/Bowel: Stomach is within normal limits. Appendix appears normal. No evidence of bowel wall thickening, distention, or inflammatory changes. Vascular/Lymphatic: Aortic atherosclerosis. No enlarged abdominal or pelvic lymph nodes. Reproductive: Mild prostatomegaly. Other: Severe anasarca. Small volume ascites throughout the abdomen and pelvis. Musculoskeletal: No acute or significant osseous findings. IMPRESSION: 1. A suspected right upper pole renal mass is not clearly appreciated by noncontrast CT. Noncontrast CT is of limited value for the detection and characterization of renal masses. Recommend multiphasic contrast enhanced CT or MRI on a nonemergent basis, when clinically appropriate for characterization. 2. Rounded subpleural opacity of the dependent right lower lobe, measuring 2.2 cm. This is nonspecific, however this appearance suggests pulmonary infarction. Differential considerations include both airspace disease and parenchymal pulmonary nodule. Consider contrast enhanced CT angiogram of the chest to further evaluate. 3. Coarse, nodular  morphology of the liver, suggestive of cirrhosis. 4. Small volume ascites throughout the abdomen and pelvis. 5. Severe anasarca. 6. Cholelithiasis. Mild thickening of the gallbladder wall with pericholecystic fluid, nonspecific in the setting of ascites. 7. Mild prostatomegaly. 8. Coronary artery disease. Aortic Atherosclerosis (ICD10-I70.0). Electronically Signed   By: Jearld Lesch M.D.   On: 09/01/2021 18:20    Cardiac Studies   Echo  1. Left ventricular ejection fraction, by estimation, is <20%. The left  ventricle has severely decreased function. The left ventricle demonstrates  global hypokinesis. There is mild concentric left ventricular hypertrophy.  Left ventricular diastolic  parameters are consistent with Grade III diastolic dysfunction  (restrictive).   2. Right ventricular systolic function is moderately reduced. The right  ventricular size is mildly enlarged.   3. Left atrial size was mildly dilated.   4. Right atrial size was moderately dilated.   5. The mitral valve is normal in structure. Mild mitral valve  regurgitation.   6. The aortic valve is normal in structure. Aortic valve regurgitation is  not visualized. No aortic stenosis is present.   7. The inferior vena cava is dilated in size with <50% respiratory  variability, suggesting right atrial  pressure of 15 mmHg.  Patient Profile   TYRIAN PEART is a 70 y.o. male with a hx of cardiomyopathy who is being seen 09/02/2021 for the evaluation of shortness of breath , ejection fraction 20%, alcohol abuse, hypoalbuminemia, possible cirrhosis, chronic renal dysfunction   Assessment & Plan    Cardiomyopathy EF 20% Presumed to be nonischemic given heavy alcohol use but requires ischemic work-up at some point whether inpatient or outpatient -On metoprolol, Farxiga -We will start low-dose hydralazine with hold parameters for low blood pressure -We will continue Lasix infusion with close monitoring of renal  function -Needs aggressive nutrition, albumin 2.5 likely contributing to anasarca type picture   2.  Abdominal ascites, heavy alcohol abuse, possible cirrhosis Elevated total bilirubin, low albumin, anasarca On aggressive diuretics   3.  CKD -Consider renal input. Unable to exclude cardiorenal syndrome Close monitoring with Lasix infusion  Alcohol abuse Complete cessation recommended  Hypoalbuminemia Has not been eating well, heavy alcohol Albumin 2.5 likely contributing to third spacing  Long discussion with patient and patient's wife at bedside   Total encounter time more than 35 minutes  Greater than 50% was spent in counseling and coordination of care with the patient   For questions or updates, please contact [facility] Please consult [email or web address] for contact info under        Signed, Julien Nordmann, MD  09/03/2021, 9:35 AM

## 2021-09-04 DIAGNOSIS — R945 Abnormal results of liver function studies: Secondary | ICD-10-CM | POA: Diagnosis not present

## 2021-09-04 DIAGNOSIS — I426 Alcoholic cardiomyopathy: Secondary | ICD-10-CM | POA: Diagnosis not present

## 2021-09-04 DIAGNOSIS — R531 Weakness: Secondary | ICD-10-CM | POA: Diagnosis not present

## 2021-09-04 DIAGNOSIS — I5020 Unspecified systolic (congestive) heart failure: Secondary | ICD-10-CM | POA: Diagnosis not present

## 2021-09-04 DIAGNOSIS — E46 Unspecified protein-calorie malnutrition: Secondary | ICD-10-CM

## 2021-09-04 LAB — CBC
HCT: 39.8 % (ref 39.0–52.0)
Hemoglobin: 14.6 g/dL (ref 13.0–17.0)
MCH: 30.3 pg (ref 26.0–34.0)
MCHC: 36.7 g/dL — ABNORMAL HIGH (ref 30.0–36.0)
MCV: 82.6 fL (ref 80.0–100.0)
Platelets: 159 10*3/uL (ref 150–400)
RBC: 4.82 MIL/uL (ref 4.22–5.81)
RDW: 17.1 % — ABNORMAL HIGH (ref 11.5–15.5)
WBC: 3.9 10*3/uL — ABNORMAL LOW (ref 4.0–10.5)
nRBC: 0 % (ref 0.0–0.2)

## 2021-09-04 LAB — COMPREHENSIVE METABOLIC PANEL
ALT: 42 U/L (ref 0–44)
AST: 59 U/L — ABNORMAL HIGH (ref 15–41)
Albumin: 2.4 g/dL — ABNORMAL LOW (ref 3.5–5.0)
Alkaline Phosphatase: 125 U/L (ref 38–126)
Anion gap: 8 (ref 5–15)
BUN: 44 mg/dL — ABNORMAL HIGH (ref 8–23)
CO2: 21 mmol/L — ABNORMAL LOW (ref 22–32)
Calcium: 8 mg/dL — ABNORMAL LOW (ref 8.9–10.3)
Chloride: 104 mmol/L (ref 98–111)
Creatinine, Ser: 1.55 mg/dL — ABNORMAL HIGH (ref 0.61–1.24)
GFR, Estimated: 48 mL/min — ABNORMAL LOW (ref >60)
Glucose, Bld: 105 mg/dL — ABNORMAL HIGH (ref 70–99)
Potassium: 3.3 mmol/L — ABNORMAL LOW (ref 3.5–5.1)
Sodium: 133 mmol/L — ABNORMAL LOW (ref 135–145)
Total Bilirubin: 3.4 mg/dL — ABNORMAL HIGH (ref 0.3–1.2)
Total Protein: 6.1 g/dL — ABNORMAL LOW (ref 6.5–8.1)

## 2021-09-04 LAB — MAGNESIUM: Magnesium: 2.1 mg/dL (ref 1.7–2.4)

## 2021-09-04 LAB — PHOSPHORUS: Phosphorus: 2.2 mg/dL — ABNORMAL LOW (ref 2.5–4.6)

## 2021-09-04 MED ORDER — POTASSIUM PHOSPHATES 15 MMOLE/5ML IV SOLN
30.0000 mmol | Freq: Once | INTRAVENOUS | Status: AC
  Administered 2021-09-04: 30 mmol via INTRAVENOUS
  Filled 2021-09-04: qty 10

## 2021-09-04 NOTE — Progress Notes (Signed)
Physical Therapy Treatment Patient Details Name: Tyler Ochoa MRN: [id number] DOB: 31-Aug-1951 Today's Date: 09/04/2021   History of Present Illness Pt is a 70 y/o M with PMH: HTN, HLD, HFrRF, and renal insufficiency who presented to the ED d/t wrosening SOB and weakness with concerns for fluid overload from HF clinic. Pt with h/o noncompliance including not f/u with cardiologist and not getting cardiac testing after last hospitalization in April 2022. Cardiology consult reports that increased troponin is 2/2 CHF.    PT Comments    Patient alert, agreeable to PT with minimal encouragement. He demonstrated good progression towards goals; able to sit EOB with CGA, participate in several seated exercises including perturbations to work on balance. He performed sit <> stand with RW and CGA, minA to side step towards HOB. Pt declined further mobility at this time due to fatigue and knee pain. Returned to supine with minA for LE assist. The patient would benefit from further skilled PT intervention to continue to progress towards goals. Recommendation remains appropriate.     Recommendations for follow up therapy are one component of a multi-disciplinary discharge planning process, led by the attending physician.  Recommendations may be updated based on patient status, additional functional criteria and insurance authorization.  Follow Up Recommendations  Skilled nursing-short term rehab (<3 hours/day)     Assistance Recommended at Discharge    Equipment Recommendations  None recommended by PT    Recommendations for Other Services       Precautions / Restrictions Precautions Precautions: Fall Restrictions Weight Bearing Restrictions: No     Mobility  Bed Mobility Overal bed mobility: Needs Assistance Bed Mobility: Supine to Sit;Sit to Supine     Supine to sit: Min guard;HOB elevated Sit to supine: Min assist   General bed mobility comments: minA for lower extremities     Transfers Overall transfer level: Needs assistance Equipment used: Rolling walker (2 wheels) Transfers: Sit to/from Stand Sit to Stand: Min guard                Ambulation/Gait Ambulation/Gait assistance: Editor, commissioning (Feet): 5 Feet Assistive device: Rolling walker (2 wheels)       General Gait Details: Pt maintains BLE hip & knee flexion during standing; able to take several side steps at EOB with step by step cueing for sequencing with RW. reported fatigue, declined further ambulation at this time   Stairs             Wheelchair Mobility    Modified Rankin (Stroke Patients Only)       Balance Overall balance assessment: Needs assistance Sitting-balance support: Feet supported;No upper extremity supported Sitting balance-Leahy Scale: Good     Standing balance support: Bilateral upper extremity supported;During functional activity Standing balance-Leahy Scale: Fair Standing balance comment: F static, P dynamic standing. Able to sustain static with SBA/CGA with RW for UE support, able to alternate hands from RW in static standing for ADLs such as holding urinal to void                            Cognition Arousal/Alertness: Awake/alert Behavior During Therapy: [facility] for tasks assessed/performed;Flat affect Overall Cognitive Status: Within Functional Limits for tasks assessed                                 General Comments: oriented to task, place, discharge plan  Exercises Other Exercises Other Exercises: seated marching, LAQ, heel raises x10. Seated purturbations x45 seconds via PT    General Comments        Pertinent Vitals/Pain Pain Score: 5  Pain Location: BLE knees, increases with mobility, chronic Pain Descriptors / Indicators: Aching;Sore Pain Intervention(s): Monitored during session;Limited activity within patient's tolerance;Repositioned    Home Living                           Prior Function            PT Goals (current goals can now be found in the care plan section) Progress towards PT goals: Progressing toward goals    Frequency    Min 2X/week      PT Plan Current plan remains appropriate    Co-evaluation              AM-PAC PT "6 Clicks" Mobility   Outcome Measure  Help needed turning from your back to your side while in a flat bed without using bedrails?: A Little Help needed moving from lying on your back to sitting on the side of a flat bed without using bedrails?: A Little Help needed moving to and from a bed to a chair (including a wheelchair)?: A Little Help needed standing up from a chair using your arms (e.g., wheelchair or bedside chair)?: A Little Help needed to walk in hospital room?: A Lot Help needed climbing 3-5 steps with a railing? : A Lot 6 Click Score: 16    End of Session   Activity Tolerance: Patient tolerated treatment well Patient left: in bed;with call bell/phone within reach;with family/visitor present;with bed alarm set Nurse Communication: Mobility status PT Visit Diagnosis: Difficulty in walking, not elsewhere classified (R26.2);Pain;Muscle weakness (generalized) (M62.81) Pain - Right/Left:  (bilateral\)     Time: 0786-7544 PT Time Calculation (min) (ACUTE ONLY): 14 min  Charges:  $Therapeutic Exercise: 8-22 mins                    Olga Coaster PT, DPT 3:41 PM,09/04/21

## 2021-09-04 NOTE — Progress Notes (Signed)
PT Cancellation Note  Patient Details Name: Tyler Ochoa MRN: [id number] DOB: 05/26/1951   Cancelled Treatment:    Reason Eval/Treat Not Completed: Patient declined, no reason specified;Fatigue/lethargy limiting ability to participate. PT attempted treatment, pt refusing stating he "may d/c tomorrow." PT encouraged participation and again offered PT services, providing pt with multiple treatment ideas. Pt then stated he was up all night due to his IV. PT treatment has been attempted for 3 days with patient refusal.    Basilia Jumbo PT, DPT 09/04/21 2:29 PM [phone]

## 2021-09-04 NOTE — Progress Notes (Signed)
Progress Note  Patient Name: Tyler Ochoa Date of Encounter: 09/04/2021  Primary Cardiologist: Agbor-Etang  Subjective   Feeling a little better. Less dyspnea and lower extremity swelling. No chest pain. Upon to lay completely flat. Documented UOP 350 mL for the past 24 hours (+ 266 mL for the past 24 hours), net - 3.7 L for the admission. Weight 100.4-->99.2 kg over the past 24 hours. Renal function improving. Albumin remains low.   Inpatient Medications    Scheduled Meds:  aspirin EC  81 mg Oral Daily   atorvastatin  80 mg Oral QHS   dapagliflozin propanediol  10 mg Oral QAC breakfast   feeding supplement  237 mL Oral TID BM   heparin injection (subcutaneous)  5,000 Units Subcutaneous Q8H   metoprolol succinate  12.5 mg Oral Q breakfast   potassium chloride SA  20 mEq Oral Daily   Continuous Infusions:  furosemide (LASIX) 200 mg in dextrose 5% 100 mL (2mg /mL) infusion 4 mg/hr (09/03/21 1442)   potassium PHOSPHATE IVPB (in mmol) 30 mmol (09/04/21 1021)   PRN Meds: acetaminophen **OR** acetaminophen, albuterol, guaiFENesin-dextromethorphan, magnesium hydroxide, ondansetron **OR** ondansetron (ZOFRAN) IV, traZODone   Vital Signs    Vitals:   09/03/21 2344 09/04/21 0258 09/04/21 0952 09/04/21 1221  BP: 101/72 107/67 124/66 117/78  Pulse: 100 95 94 93  Resp: 18 18 19 18   Temp: 98.2 F (36.8 C) 98.1 F (36.7 C) 97.9 F (36.6 C) 97.9 F (36.6 C)  TempSrc:   Oral Oral  SpO2: 96% 95% 96% 100%  Weight:  99.2 kg    Height:        Intake/Output Summary (Last 24 hours) at 09/04/2021 1235 Last data filed at 09/04/2021 1100 Gross per 24 hour  Intake 736.1 ml  Output 550 ml  Net 186.1 ml   Filed Weights   09/02/21 0616 09/03/21 0404 09/04/21 0258  Weight: 101.5 kg 100.4 kg 99.2 kg    Telemetry    SR with rare PVCs and artifact - Personally Reviewed  ECG    No new tracings - Personally Reviewed  Physical Exam   GEN: No acute distress.   Neck: JVD ~ 8  cm. Cardiac: RRR, no murmurs, rubs, or gallops.  Respiratory: Diminished breath sounds along the bases bilaterally.  GI: Soft, nontender, non-distended.   MS: Trace bilateral lower extremity edema; No deformity. Neuro:  Alert and oriented x 3; Nonfocal.  Psych: Normal affect.  Labs    Chemistry Recent Labs  Lab 09/02/21 0220 09/03/21 0412 09/04/21 0353  NA 132* 135 133*  K 3.7 3.7 3.3*  CL 105 108 104  CO2 19* 21* 21*  GLUCOSE 108* 111* 105*  BUN 51* 47* 44*  CREATININE 1.58* 1.63* 1.55*  CALCIUM 8.1* 8.0* 8.0*  PROT 6.3* 6.1* 6.1*  ALBUMIN 2.7* 2.6* 2.4*  AST 52* 53* 59*  ALT 50* 48* 42  ALKPHOS 127* 118 125  BILITOT 3.7* 3.9* 3.4*  GFRNONAA 47* 46* 48*  ANIONGAP 8 6 8      Hematology Recent Labs  Lab 09/02/21 0220 09/03/21 0412 09/04/21 0353  WBC 3.4* 4.1 3.9*  RBC 4.98 5.22 4.82  HGB 15.0 15.2 14.6  HCT 41.6 43.4 39.8  MCV 83.5 83.1 82.6  MCH 30.1 29.1 30.3  MCHC 36.1* 35.0 36.7*  RDW 17.2* 17.1* 17.1*  PLT 153 160 159    Cardiac EnzymesNo results for input(s): TROPONINI in the last 168 hours. No results for input(s): TROPIPOC in the last 168 hours.  BNP Recent Labs  Lab 08/28/21 1506 08/30/21 0448  BNP 3,231.8* 4,339.5*     DDimer No results for input(s): DDIMER in the last 168 hours.   Radiology    No results found.  Cardiac Studies   2D echo 08/29/2021: 1. Left ventricular ejection fraction, by estimation, is <20%. The left  ventricle has severely decreased function. The left ventricle demonstrates  global hypokinesis. There is mild concentric left ventricular hypertrophy.  Left ventricular diastolic  parameters are consistent with Grade III diastolic dysfunction  (restrictive).   2. Right ventricular systolic function is moderately reduced. The right  ventricular size is mildly enlarged.   3. Left atrial size was mildly dilated.   4. Right atrial size was moderately dilated.   5. The mitral valve is normal in structure. Mild  mitral valve  regurgitation.   6. The aortic valve is normal in structure. Aortic valve regurgitation is  not visualized. No aortic stenosis is present.   7. The inferior vena cava is dilated in size with <50% respiratory  variability, suggesting right atrial pressure of 15 mmHg. __________  2D echo 01/07/2021:  1. Left ventricular ejection fraction, by estimation, is 25 to 30%. The  left ventricle has severely decreased function. The left ventricle  demonstrates global hypokinesis. There is severe concentric left  ventricular hypertrophy. Left ventricular  diastolic parameters are indeterminate.   2. Right ventricular systolic function is severely reduced. The right  ventricular size is mildly enlarged. Mildly increased right ventricular  wall thickness.   3. Left atrial size was severely dilated.   4. Right atrial size was severely dilated.   5. A small pericardial effusion is present. The pericardial effusion is  posterior and lateral to the left ventricle.   6. The mitral valve is normal in structure. Mild mitral valve  regurgitation.   7. The aortic valve is tricuspid. Aortic valve regurgitation is not  visualized.   8. Aortic dilatation noted. There is mild dilatation of the aortic root,  measuring 41 mm.   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 consider CMR.  Patient Profile     70 y.o. male with history of HFrEF of uncertain etiology and dates back to at least 01/2021, alcohol abuse with ascites and cirrhosis, CKD stage II-III, HTN, and HLD who we are seeing for acute on chronic HFrEF and elevated troponin.  Assessment & Plan    1. Acute on chronic HFrEF: -Volume up -Uncertain etiology and chronicity, dating back to at least 01/2021 by echo -Continue IV diuresis with Lasix gtt -Continue current GDMT including  Farxiga and Toprol XL -Not currently able to start ACEi/ARB/ARNI/MRA secondary to AKI -BP has been soft at times, which has precluded the addition of BiDil, look to start if BP remains improved over the next 24 hours  -Previously cMRI has been recommended to evaluate for infiltrative cardiomyopathy, follow up as outpatient   2. Elevated troponin: -Mildly elevated and peaking at 420 -Not consistent with ACS -No indication for heparin gtt -Not currently able to undergo [facility] with AKI -He will need ischemic evaluation, with timing to be determined at this point  3. Ascites with liver cirrhosis with hypoalbuminemia: -Likely in the setting of alcohol abuse -Cessation recommended  -Third spacing  -Per primary service   4. Lower extremity swelling: -Multifactorial including cardiomyopathy, cirrhosis with hypoalbuminemia and third spacing -CHF management as above -Remaining per IM  5.  Acute on CKD stage III: -Renal function improving with diuresis  -Monitor   For questions or updates, please contact [facility] Please consult [email or web address] for contact info under Cardiology/STEMI.    Signed, Eula Listen, PA-C [facility] Pager: [phone] 09/04/2021, 12:35 PM

## 2021-09-04 NOTE — TOC Progression Note (Signed)
Transition of Care [facility]) - Progression Note    Patient Details  Name: MITSUO BUDNICK MRN: [id number] Date of Birth: 28-Jul-1951  [facility] CM/SW Contact  Maree Krabbe, LCSW Phone Number: 09/04/2021, 2:41 PM  Clinical Narrative:   Will need updated PT note to start auth. PT notified.    Expected Discharge Plan: Skilled Nursing Facility Barriers to Discharge: Continued Medical Work up  Expected Discharge Plan and Services Expected Discharge Plan: Skilled Nursing Facility In-house Referral: Clinical Social Work   Post Acute Care Choice: Skilled Nursing Facility Living arrangements for the past 2 months: Single Family Home                                       Social Determinants of Health (SDOH) Interventions    Readmission Risk Interventions No flowsheet data found.

## 2021-09-04 NOTE — Progress Notes (Addendum)
PROGRESS NOTE  Tyler Ochoa HQI:[id number] DOB: Jul 17, 1951   PCP: Queen Slough, MD  Patient is from:.  [place] rolling walker and cane.  Lives with his girlfriend.  DOA: 08/28/2021 LOS: 7  Chief complaints:  Chief Complaint  Patient presents with   Weakness   Ascites     Brief Narrative / Interim history: 70 year old male with PMH of systolic CHF, chronic edema, osteoarthritis, debility, HTN and HLD presenting with generalized weakness, dry cough, DOE, BLE edema and orthopnea, and admitted with acute on chronic systolic CHF, possible non-STEMI and AKI.  BNP elevated to 3200.  Troponin elevated to 420 and improved to 327.  Started on IV Lasix and IV heparin.  Cardiology consulted.  TTE with LVEF <20% (25 to 30% in 01/2021), G3 DD and moderate RAE.  No further cardiac or pulmonary evaluation per cardiologist.  Therapy recommended SNF.  Subjective: No significant event overnight, patient was resting comfortably, denied any worsening of shortness of breath, lower extremity edema is improving, Denies any chest pain or palpitations, no any other active issues.   Objective: Vitals:   09/03/21 2344 09/04/21 0258 09/04/21 0952 09/04/21 1221  BP: 101/72 107/67 124/66 117/78  Pulse: 100 95 94 93  Resp: 18 18 19 18   Temp: 98.2 F (36.8 C) 98.1 F (36.7 C) 97.9 F (36.6 C) 97.9 F (36.6 C)  TempSrc:   Oral Oral  SpO2: 96% 95% 96% 100%  Weight:  99.2 kg    Height:        Intake/Output Summary (Last 24 hours) at 09/04/2021 1434 Last data filed at 09/04/2021 1200 Gross per 24 hour  Intake 596.1 ml  Output 850 ml  Net -253.9 ml   Filed Weights   09/02/21 0616 09/03/21 0404 09/04/21 0258  Weight: 101.5 kg 100.4 kg 99.2 kg    Examination:  GENERAL: No apparent distress.  Nontoxic. HEENT: MMM.  Vision and hearing grossly intact.  NECK: Supple.  No apparent JVD.  RESP: ctab save for mild rales at bases CVS:  RRR. Heart sounds normal.  ABD/GI/GU: BS+. Abd soft, mildly  distended due to ascites, nontender.  MSK/EXT:  Moves extremities. No apparent deformity.  2-3+ BLE edema SKIN: no apparent skin lesion or wound NEURO: Awake and alert. Oriented appropriately.  No apparent focal neuro deficit. PSYCH: Calm. Normal affect.   Procedures:  None  Microbiology summarized: COVID-19 and influenza PCR nonreactive.  Assessment & Plan: Acute on chronic combined CHF: TTE with LVEF of <20% (25 to 30% in 01/2021), G3 DD and moderate RAE.  Reportedly had generalized weakness, DOE, 3 pillow orthopnea and BLE edema on presentation although  patient denies significant acute change.  BNP 3200>> 4400.  CXR with stable cardiomegaly.  Elevated troponin likely from demand ischemia as well. -No further cardiac evaluation from cardiology standpoint, needs outpt f/u with dr. 02/2021 -s/p IV Lasix 40 mg daily, it was decreased due to elevated creatinine.  10/25 started Lasix IV infusion for gradual continuous diuresis.   -GDM, pt was on Entresto which was held due to low bp,  continue Farxiga, decrease Toprol-XL from 50 to 12.5 mg p.o. daily due to low blood pressure -Monitor fluid status, renal functions and electrolytes. -low Sodium diet and fluid restriction 1.5 L per day. -Palliative medicine consulted for goal of care discussion. Patient was seen by cardiology Dr. 11-12-2002, patient is noncompliant with follow-ups, patient wants to switch cardiologist, spoke to Dr. Welton Flakes and he is okay with that. Discussed with cardiologist [facility] who  will follow patient and will schedule appointment as an outpatient as well.     Liver cirrhosis with ascites found on CT abdomen pelvis,  Unknown cause of liver cirrhosis could be due to congestion secondary to CHF. Continue diuretic as above  ammonia level wnl   Elevated troponin without significant delta: Likely demand ischemia.  No RWMA on TTE. -Continue statin, aspirin and beta-blocker  AKI/azotemia: Cardiorenal?  Base creatinine appears to  be mid 1s, up to 1.57 today -Continue monitoring - diuresis as above  Hypophosphatemia, phos repleted.  Elevated liver enzymes/hyperbilirubinemia/coagulopathy: CK within normal.  Denies EtOH.  Likely congestive hepatopathy as improving w/ diuresis. Appears to have hx of elevated bilirubin Recent Labs  Lab 08/31/21 1315 09/01/21 0418 09/02/21 0220 09/03/21 0412 09/04/21 0353  AST 62* 57* 52* 53* 59*  ALT 60* 57* 50* 48* 42  ALKPHOS 133* 130* 127* 118 125  BILITOT 4.4* 3.8* 3.7* 3.9* 3.4*  PROT 6.3* 6.0* 6.3* 6.1* 6.1*  ALBUMIN 2.8* 2.5* 2.7* 2.6* 2.4*  -Continue monitoring LFTs - RUQ u/s shows suspected liver cirrhosis, mild ascites and right renal mass, recommended CT or MRI CT scan abdomen pelvis shows liver cirrhosis, ascites could not rule out renal mass due to noncontrast study, recommended CT scan with contrast versus MRI with contrast.   Essential hypertension: Normotensive. -Cardiac meds as above  Hyperlipidemia:  -Continue statin.  Ambulatory dysfunction/knee osteoarthritis -Therapy recommended SNF, patient interested, toc to pursue  BLE edema, due to chf, Liver cirrhosis and low albumin, third spacing. Dopplers neg for dvt Continue to wear TED stockings, and diuretics as above  Thrombocytopenia: Stable, resolved Recent Labs  Lab 08/28/21 1506 08/29/21 0623 08/30/21 0448 09/02/21 0220 09/03/21 0412 09/04/21 0353  PLT 133* 135* 139* 153 160 159  -Continue monitoring  Right renal mass, patient was recommended to follow with PCP to repeat CT abdomen pelvis with contrast when renal functions improved versus MRI as an outpatient and follow-up with urology as an outpatient   Goal of care counseling: Patient with advanced CHF and other comorbidity as above.  Poor long-term prognosis.  Hospice candidate.  However, patient still full code and prefers to remain full code despite risk and benefit discussion.  -Palliative medicine consulted.  Body mass index is  28.02 kg/m.   Pressure Injury 08/29/21 Buttocks Medial Stage 2 -  Partial thickness loss of dermis presenting as a shallow open injury with a red, pink wound bed without slough. (Active)  08/29/21 2109  Location: Buttocks  Location Orientation: Medial  Staging: Stage 2 -  Partial thickness loss of dermis presenting as a shallow open injury with a red, pink wound bed without slough.  Wound Description (Comments):   Present on Admission: Yes   DVT prophylaxis:  Place TED hose Start: 09/01/21 1055 heparin injection 5,000 Units Start: 08/29/21 2200  Code Status: Full code Family Communication: Patient and/or RN. Available if any question.  Level of care: Progressive Cardiac Status is: Inpatient  Remains inpatient appropriate because: Safe disposition/SNF in 1-2 days     Consultants:  Cardiology   Sch Meds:  Scheduled Meds:  aspirin EC  81 mg Oral Daily   atorvastatin  80 mg Oral QHS   dapagliflozin propanediol  10 mg Oral QAC breakfast   feeding supplement  237 mL Oral TID BM   heparin injection (subcutaneous)  5,000 Units Subcutaneous Q8H   metoprolol succinate  12.5 mg Oral Q breakfast   potassium chloride SA  20 mEq Oral Daily   Continuous  Infusions:  furosemide (LASIX) 200 mg in dextrose 5% 100 mL (2mg /mL) infusion 4 mg/hr (09/03/21 1442)   potassium PHOSPHATE IVPB (in mmol) 30 mmol (09/04/21 1021)    PRN Meds:.acetaminophen **OR** acetaminophen, albuterol, guaiFENesin-dextromethorphan, magnesium hydroxide, ondansetron **OR** ondansetron (ZOFRAN) IV, traZODone  Antimicrobials: Anti-infectives (From admission, onward)    None        I have personally reviewed the following labs and images: CBC: Recent Labs  Lab 08/28/21 1506 08/29/21 0623 08/30/21 0448 09/02/21 0220 09/03/21 0412 09/04/21 0353  WBC 3.8* 4.3 3.5* 3.4* 4.1 3.9*  NEUTROABS 2.7  --   --   --   --   --   HGB 16.7 17.2* 16.0 15.0 15.2 14.6  HCT 47.2 52.5* 46.4 41.6 43.4 39.8  MCV 86.1 86.3  83.8 83.5 83.1 82.6  PLT 133* 135* 139* 153 160 159   BMP &GFR Recent Labs  Lab 08/30/21 0448 08/31/21 1315 09/01/21 0418 09/02/21 0220 09/03/21 0412 09/04/21 0353  NA 135 134* 134* 132* 135 133*  K 4.2 4.9 3.6 3.7 3.7 3.3*  CL 108 105 106 105 108 104  CO2 18* 18* 19* 19* 21* 21*  GLUCOSE 90 120* 110* 108* 111* 105*  BUN 65* 60* 58* 51* 47* 44*  CREATININE 1.90* 1.71* 1.57* 1.58* 1.63* 1.55*  CALCIUM 8.4* 8.3* 8.2* 8.1* 8.0* 8.0*  MG  --   --  2.2 2.0 2.1 2.1  PHOS 4.1  --  2.7 2.7 2.5 2.2*   Estimated Creatinine Clearance: 57.4 mL/min (A) (by C-G formula based on SCr of 1.55 mg/dL (H)). Liver & Pancreas: Recent Labs  Lab 08/31/21 1315 09/01/21 0418 09/02/21 0220 09/03/21 0412 09/04/21 0353  AST 62* 57* 52* 53* 59*  ALT 60* 57* 50* 48* 42  ALKPHOS 133* 130* 127* 118 125  BILITOT 4.4* 3.8* 3.7* 3.9* 3.4*  PROT 6.3* 6.0* 6.3* 6.1* 6.1*  ALBUMIN 2.8* 2.5* 2.7* 2.6* 2.4*   No results for input(s): LIPASE, AMYLASE in the last 168 hours. Recent Labs  Lab 09/02/21 1527 09/03/21 0412  AMMONIA 30 22   Diabetic: No results for input(s): HGBA1C in the last 72 hours. No results for input(s): GLUCAP in the last 168 hours. Cardiac Enzymes: Recent Labs  Lab 08/30/21 0448  CKTOTAL 173   No results for input(s): PROBNP in the last 8760 hours. Coagulation Profile: Recent Labs  Lab 08/29/21 0108  INR 1.8*   Thyroid Function Tests: No results for input(s): TSH, T4TOTAL, FREET4, T3FREE, THYROIDAB in the last 72 hours. Lipid Profile: No results for input(s): CHOL, HDL, LDLCALC, TRIG, CHOLHDL, LDLDIRECT in the last 72 hours. Anemia Panel: No results for input(s): VITAMINB12, FOLATE, FERRITIN, TIBC, IRON, RETICCTPCT in the last 72 hours. Urine analysis:    Component Value Date/Time   COLORURINE YELLOW (A) 03/07/2021 1705   APPEARANCEUR CLEAR (A) 03/07/2021 1705   LABSPEC 1.010 03/07/2021 1705   PHURINE 5.0 03/07/2021 1705   GLUCOSEU >=500 (A) 03/07/2021 1705    HGBUR SMALL (A) 03/07/2021 1705   BILIRUBINUR NEGATIVE 03/07/2021 1705   KETONESUR NEGATIVE 03/07/2021 1705   PROTEINUR 30 (A) 03/07/2021 1705   NITRITE NEGATIVE 03/07/2021 1705   LEUKOCYTESUR NEGATIVE 03/07/2021 1705   Sepsis Labs: Invalid input(s): PROCALCITONIN, LACTICIDVEN  Microbiology: Recent Results (from the past 240 hour(s))  Resp Panel by RT-PCR (Flu A&B, Covid) Nasopharyngeal Swab     Status: None   Collection Time: 08/28/21  9:43 PM   Specimen: Nasopharyngeal Swab; Nasopharyngeal(NP) swabs in vial transport medium  Result Value  Ref Range Status   SARS Coronavirus 2 by RT PCR NEGATIVE NEGATIVE Final    Comment: (NOTE) SARS-CoV-2 target nucleic acids are NOT DETECTED.  The SARS-CoV-2 RNA is generally detectable in upper respiratory specimens during the acute phase of infection. The lowest concentration of SARS-CoV-2 viral copies this assay can detect is 138 copies/mL. A negative result does not preclude SARS-Cov-2 infection and should not be used as the sole basis for treatment or other patient management decisions. A negative result may occur with  improper specimen collection/handling, submission of specimen other than nasopharyngeal swab, presence of viral mutation(s) within the areas targeted by this assay, and inadequate number of viral copies(<138 copies/mL). A negative result must be combined with clinical observations, patient history, and epidemiological information. The expected result is Negative.  Fact Sheet for Patients:  BloggerCourse.com  Fact Sheet for Healthcare Providers:  SeriousBroker.it  This test is no t yet approved or cleared by the [place] FDA and  has been authorized for detection and/or diagnosis of SARS-CoV-2 by FDA under an Emergency Use Authorization (EUA). This EUA will remain  in effect (meaning this test can be used) for the duration of the COVID-19 declaration under Section  564(b)(1) of the Act, 21 U.S.C.section 360bbb-3(b)(1), unless the authorization is terminated  or revoked sooner.       Influenza A by PCR NEGATIVE NEGATIVE Final   Influenza B by PCR NEGATIVE NEGATIVE Final    Comment: (NOTE) The Xpert Xpress SARS-CoV-2/FLU/RSV plus assay is intended as an aid in the diagnosis of influenza from Nasopharyngeal swab specimens and should not be used as a sole basis for treatment. Nasal washings and aspirates are unacceptable for Xpert Xpress SARS-CoV-2/FLU/RSV testing.  Fact Sheet for Patients: BloggerCourse.com  Fact Sheet for Healthcare Providers: SeriousBroker.it  This test is not yet approved or cleared by the [place] FDA and has been authorized for detection and/or diagnosis of SARS-CoV-2 by FDA under an Emergency Use Authorization (EUA). This EUA will remain in effect (meaning this test can be used) for the duration of the COVID-19 declaration under Section 564(b)(1) of the Act, 21 U.S.C. section 360bbb-3(b)(1), unless the authorization is terminated or revoked.  Performed at [facility], [address], [place], [place] [address]     Radiology Studies: No results found.    Gillis Santa, MD [facility]  If 7PM-7AM, please contact night-coverage [email or web address] 09/04/2021, 2:34 PM

## 2021-09-05 DIAGNOSIS — E8770 Fluid overload, unspecified: Secondary | ICD-10-CM

## 2021-09-05 DIAGNOSIS — I5020 Unspecified systolic (congestive) heart failure: Secondary | ICD-10-CM | POA: Diagnosis not present

## 2021-09-05 DIAGNOSIS — N179 Acute kidney failure, unspecified: Secondary | ICD-10-CM

## 2021-09-05 DIAGNOSIS — I214 Non-ST elevation (NSTEMI) myocardial infarction: Secondary | ICD-10-CM | POA: Diagnosis not present

## 2021-09-05 DIAGNOSIS — I5023 Acute on chronic systolic (congestive) heart failure: Secondary | ICD-10-CM | POA: Diagnosis not present

## 2021-09-05 DIAGNOSIS — I5021 Acute systolic (congestive) heart failure: Secondary | ICD-10-CM

## 2021-09-05 LAB — COMPREHENSIVE METABOLIC PANEL
ALT: 43 U/L (ref 0–44)
AST: 70 U/L — ABNORMAL HIGH (ref 15–41)
Albumin: 2.4 g/dL — ABNORMAL LOW (ref 3.5–5.0)
Alkaline Phosphatase: 118 U/L (ref 38–126)
Anion gap: 4 — ABNORMAL LOW (ref 5–15)
BUN: 41 mg/dL — ABNORMAL HIGH (ref 8–23)
CO2: 24 mmol/L (ref 22–32)
Calcium: 8.1 mg/dL — ABNORMAL LOW (ref 8.9–10.3)
Chloride: 104 mmol/L (ref 98–111)
Creatinine, Ser: 1.51 mg/dL — ABNORMAL HIGH (ref 0.61–1.24)
GFR, Estimated: 50 mL/min — ABNORMAL LOW (ref >60)
Glucose, Bld: 111 mg/dL — ABNORMAL HIGH (ref 70–99)
Potassium: 3.4 mmol/L — ABNORMAL LOW (ref 3.5–5.1)
Sodium: 132 mmol/L — ABNORMAL LOW (ref 135–145)
Total Bilirubin: 3.4 mg/dL — ABNORMAL HIGH (ref 0.3–1.2)
Total Protein: 6.2 g/dL — ABNORMAL LOW (ref 6.5–8.1)

## 2021-09-05 LAB — CBC
HCT: 39.7 % (ref 39.0–52.0)
Hemoglobin: 14.4 g/dL (ref 13.0–17.0)
MCH: 30.1 pg (ref 26.0–34.0)
MCHC: 36.3 g/dL — ABNORMAL HIGH (ref 30.0–36.0)
MCV: 82.9 fL (ref 80.0–100.0)
Platelets: 153 10*3/uL (ref 150–400)
RBC: 4.79 MIL/uL (ref 4.22–5.81)
RDW: 17.2 % — ABNORMAL HIGH (ref 11.5–15.5)
WBC: 4 10*3/uL (ref 4.0–10.5)
nRBC: 0 % (ref 0.0–0.2)

## 2021-09-05 LAB — PHOSPHORUS: Phosphorus: 2.6 mg/dL (ref 2.5–4.6)

## 2021-09-05 LAB — MAGNESIUM: Magnesium: 1.9 mg/dL (ref 1.7–2.4)

## 2021-09-05 MED ORDER — POTASSIUM CHLORIDE CRYS ER 20 MEQ PO TBCR
20.0000 meq | EXTENDED_RELEASE_TABLET | Freq: Every day | ORAL | Status: DC
  Administered 2021-09-05 – 2021-09-07 (×3): 20 meq via ORAL
  Filled 2021-09-05 (×3): qty 2

## 2021-09-05 MED ORDER — POTASSIUM CHLORIDE CRYS ER 20 MEQ PO TBCR
40.0000 meq | EXTENDED_RELEASE_TABLET | Freq: Once | ORAL | Status: AC
  Administered 2021-09-05: 40 meq via ORAL
  Filled 2021-09-05: qty 4

## 2021-09-05 NOTE — Progress Notes (Signed)
PROGRESS NOTE  Tyler Ochoa:[id number] DOB: Nov 26, 1950   PCP: Queen Slough, MD  Patient is from:.  [place] rolling walker and cane.  Lives with his girlfriend.  DOA: 08/28/2021 LOS: 8  Chief complaints:  Chief Complaint  Patient presents with   Weakness   Ascites     Brief Narrative / Interim history: 70 year old male with PMH of systolic CHF, chronic edema, osteoarthritis, debility, HTN and HLD presenting with generalized weakness, dry cough, DOE, BLE edema and orthopnea, and admitted with acute on chronic systolic CHF, possible non-STEMI and AKI.  BNP elevated to 3200.  Troponin elevated to 420 and improved to 327.  Started on IV Lasix and IV heparin.  Cardiology consulted.  TTE with LVEF <20% (25 to 30% in 01/2021), G3 DD and moderate RAE.  No further cardiac or pulmonary evaluation per cardiologist.  Therapy recommended SNF.  Subjective: No significant event overnight, patient was resting comfortably, denied any worsening of shortness of breath, lower extremity edema is improving, Denies any chest pain or palpitations, no any other active issues.   Objective: Vitals:   09/05/21 0347 09/05/21 0554 09/05/21 0829 09/05/21 1155  BP: 134/90  114/85 109/62  Pulse: 98  95 95  Resp: 17  17 17   Temp: 98.3 F (36.8 C)  98.4 F (36.9 C) 97.9 F (36.6 C)  TempSrc:      SpO2: 97%  96% 97%  Weight:  100.7 kg    Height:        Intake/Output Summary (Last 24 hours) at 09/05/2021 1248 Last data filed at 09/05/2021 0808 Gross per 24 hour  Intake 500.08 ml  Output 1600 ml  Net -1099.92 ml   Filed Weights   09/03/21 0404 09/04/21 0258 09/05/21 0554  Weight: 100.4 kg 99.2 kg 100.7 kg    Examination:  GENERAL: No apparent distress.  Nontoxic. HEENT: MMM.  Vision and hearing grossly intact.  NECK: Supple.  No apparent JVD.  RESP: ctab save for mild rales at bases CVS:  RRR. Heart sounds normal.  ABD/GI/GU: BS+. Abd soft, mildly distended due to ascites, nontender.   MSK/EXT:  Moves extremities. No apparent deformity.  2-3+ BLE edema SKIN: no apparent skin lesion or wound NEURO: Awake and alert. Oriented appropriately.  No apparent focal neuro deficit. PSYCH: Calm. Normal affect.   Procedures:  None  Microbiology summarized: COVID-19 and influenza PCR nonreactive.  Assessment & Plan: Acute on chronic combined CHF: TTE with LVEF of <20% (25 to 30% in 01/2021), G3 DD and moderate RAE.  Reportedly had generalized weakness, DOE, 3 pillow orthopnea and BLE edema on presentation although  patient denies significant acute change.  BNP 3200>> 4400.  CXR with stable cardiomegaly.  Elevated troponin likely from demand ischemia as well. -No further cardiac evaluation from cardiology standpoint, needs outpt f/u with dr. 02/2021 -s/p IV Lasix 40 mg daily, it was decreased due to elevated creatinine.  10/25 started Lasix IV infusion for gradual continuous diuresis.   -GDM, pt was on Entresto which was held due to low bp,  continue Farxiga, decrease Toprol-XL from 50 to 12.5 mg p.o. daily due to low blood pressure -Monitor fluid status, renal functions and electrolytes. -low Sodium diet and fluid restriction 1.5 L per day. -Palliative medicine consulted for goal of care discussion. Patient was seen by cardiology Dr. 11-12-2002, patient is noncompliant with follow-ups, patient wants to switch cardiologist, spoke to Dr. Welton Flakes and he is okay with that. Discussed with cardiologist [facility] who will follow patient  and will schedule appointment as an outpatient as well.     Liver cirrhosis with ascites found on CT abdomen pelvis,  Unknown cause of liver cirrhosis could be due to congestion secondary to CHF. Continue diuretic as above  ammonia level wnl   Elevated troponin without significant delta: Likely demand ischemia.  No RWMA on TTE. -Continue statin, aspirin and beta-blocker  AKI/azotemia: Cardiorenal?  Base creatinine appears to be mid 1s, up to 1.51  today -Continue monitoring - diuresis as above  Hypokalemia, potassium repleted. Hypophosphatemia, phos repleted.  Elevated liver enzymes/hyperbilirubinemia/coagulopathy: CK within normal.  Denies EtOH.  Likely congestive hepatopathy as improving w/ diuresis. Appears to have hx of elevated bilirubin Recent Labs  Lab 09/01/21 0418 09/02/21 0220 09/03/21 0412 09/04/21 0353 09/05/21 0412  AST 57* 52* 53* 59* 70*  ALT 57* 50* 48* 42 43  ALKPHOS 130* 127* 118 125 118  BILITOT 3.8* 3.7* 3.9* 3.4* 3.4*  PROT 6.0* 6.3* 6.1* 6.1* 6.2*  ALBUMIN 2.5* 2.7* 2.6* 2.4* 2.4*  -Continue monitoring LFTs - RUQ u/s shows suspected liver cirrhosis, mild ascites and right renal mass, recommended CT or MRI CT scan abdomen pelvis shows liver cirrhosis, ascites could not rule out renal mass due to noncontrast study, recommended CT scan with contrast versus MRI with contrast.   Essential hypertension: Normotensive. -Cardiac meds as above  Hyperlipidemia:  -Continue statin.  Ambulatory dysfunction/knee osteoarthritis -Therapy recommended SNF, patient interested, toc to pursue  BLE edema, due to chf, Liver cirrhosis and low albumin, third spacing. Dopplers neg for dvt Continue to wear TED stockings, and diuretics as above  Thrombocytopenia: Stable, resolved Recent Labs  Lab 08/30/21 0448 09/02/21 0220 09/03/21 0412 09/04/21 0353 09/05/21 0412  PLT 139* 153 160 159 153  -Continue monitoring  Right renal mass, patient was recommended to follow with PCP to repeat CT abdomen pelvis with contrast when renal functions improved versus MRI as an outpatient and follow-up with urology as an outpatient  PT and OT eval done, recommend SNF placement.  Discussed with case manager and social worker, insurance Auth has been started, most likely patient will be discharged on Monday.   Goal of care counseling: Patient with advanced CHF and other comorbidity as above.  Poor long-term prognosis.  Hospice  candidate.  However, patient still full code and prefers to remain full code despite risk and benefit discussion.  -Palliative medicine consulted.  Body mass index is 28.02 kg/m.   Pressure Injury 08/29/21 Buttocks Medial Stage 2 -  Partial thickness loss of dermis presenting as a shallow open injury with a red, pink wound bed without slough. (Active)  08/29/21 2109  Location: Buttocks  Location Orientation: Medial  Staging: Stage 2 -  Partial thickness loss of dermis presenting as a shallow open injury with a red, pink wound bed without slough.  Wound Description (Comments):   Present on Admission: Yes   DVT prophylaxis:  Place TED hose Start: 09/01/21 1055 heparin injection 5,000 Units Start: 08/29/21 2200  Code Status: Full code Family Communication: Patient and/or RN. Available if any question.  Level of care: Progressive Cardiac Status is: Inpatient  Remains inpatient appropriate because: Safe disposition/SNF in 1-2 days     Consultants:  Cardiology   Sch Meds:  Scheduled Meds:  aspirin EC  81 mg Oral Daily   atorvastatin  80 mg Oral QHS   dapagliflozin propanediol  10 mg Oral QAC breakfast   feeding supplement  237 mL Oral TID BM   heparin injection (subcutaneous)  5,000 Units Subcutaneous Q8H   metoprolol succinate  12.5 mg Oral Q breakfast   potassium chloride SA  20 mEq Oral Daily   Continuous Infusions:  furosemide (LASIX) 200 mg in dextrose 5% 100 mL (2mg /mL) infusion 4 mg/hr (09/05/21 0730)    PRN Meds:.acetaminophen **OR** acetaminophen, albuterol, guaiFENesin-dextromethorphan, magnesium hydroxide, ondansetron **OR** ondansetron (ZOFRAN) IV, traZODone  Antimicrobials: Anti-infectives (From admission, onward)    None        I have personally reviewed the following labs and images: CBC: Recent Labs  Lab 08/30/21 0448 09/02/21 0220 09/03/21 0412 09/04/21 0353 09/05/21 0412  WBC 3.5* 3.4* 4.1 3.9* 4.0  HGB 16.0 15.0 15.2 14.6 14.4  HCT 46.4  41.6 43.4 39.8 39.7  MCV 83.8 83.5 83.1 82.6 82.9  PLT 139* 153 160 159 153   BMP &GFR Recent Labs  Lab 09/01/21 0418 09/02/21 0220 09/03/21 0412 09/04/21 0353 09/05/21 0412  NA 134* 132* 135 133* 132*  K 3.6 3.7 3.7 3.3* 3.4*  CL 106 105 108 104 104  CO2 19* 19* 21* 21* 24  GLUCOSE 110* 108* 111* 105* 111*  BUN 58* 51* 47* 44* 41*  CREATININE 1.57* 1.58* 1.63* 1.55* 1.51*  CALCIUM 8.2* 8.1* 8.0* 8.0* 8.1*  MG 2.2 2.0 2.1 2.1 1.9  PHOS 2.7 2.7 2.5 2.2* 2.6   Estimated Creatinine Clearance: 59.3 mL/min (A) (by C-G formula based on SCr of 1.51 mg/dL (H)). Liver & Pancreas: Recent Labs  Lab 09/01/21 0418 09/02/21 0220 09/03/21 0412 09/04/21 0353 09/05/21 0412  AST 57* 52* 53* 59* 70*  ALT 57* 50* 48* 42 43  ALKPHOS 130* 127* 118 125 118  BILITOT 3.8* 3.7* 3.9* 3.4* 3.4*  PROT 6.0* 6.3* 6.1* 6.1* 6.2*  ALBUMIN 2.5* 2.7* 2.6* 2.4* 2.4*   No results for input(s): LIPASE, AMYLASE in the last 168 hours. Recent Labs  Lab 09/02/21 1527 09/03/21 0412  AMMONIA 30 22   Diabetic: No results for input(s): HGBA1C in the last 72 hours. No results for input(s): GLUCAP in the last 168 hours. Cardiac Enzymes: Recent Labs  Lab 08/30/21 0448  CKTOTAL 173   No results for input(s): PROBNP in the last 8760 hours. Coagulation Profile: No results for input(s): INR, PROTIME in the last 168 hours.  Thyroid Function Tests: No results for input(s): TSH, T4TOTAL, FREET4, T3FREE, THYROIDAB in the last 72 hours. Lipid Profile: No results for input(s): CHOL, HDL, LDLCALC, TRIG, CHOLHDL, LDLDIRECT in the last 72 hours. Anemia Panel: No results for input(s): VITAMINB12, FOLATE, FERRITIN, TIBC, IRON, RETICCTPCT in the last 72 hours. Urine analysis:    Component Value Date/Time   COLORURINE YELLOW (A) 03/07/2021 1705   APPEARANCEUR CLEAR (A) 03/07/2021 1705   LABSPEC 1.010 03/07/2021 1705   PHURINE 5.0 03/07/2021 1705   GLUCOSEU >=500 (A) 03/07/2021 1705   HGBUR SMALL (A)  03/07/2021 1705   BILIRUBINUR NEGATIVE 03/07/2021 1705   KETONESUR NEGATIVE 03/07/2021 1705   PROTEINUR 30 (A) 03/07/2021 1705   NITRITE NEGATIVE 03/07/2021 1705   LEUKOCYTESUR NEGATIVE 03/07/2021 1705   Sepsis Labs: Invalid input(s): PROCALCITONIN, LACTICIDVEN  Microbiology: Recent Results (from the past 240 hour(s))  Resp Panel by RT-PCR (Flu A&B, Covid) Nasopharyngeal Swab     Status: None   Collection Time: 08/28/21  9:43 PM   Specimen: Nasopharyngeal Swab; Nasopharyngeal(NP) swabs in vial transport medium  Result Value Ref Range Status   SARS Coronavirus 2 by RT PCR NEGATIVE NEGATIVE Final    Comment: (NOTE) SARS-CoV-2 target nucleic acids are NOT DETECTED.  The SARS-CoV-2 RNA is generally detectable in upper respiratory specimens during the acute phase of infection. The lowest concentration of SARS-CoV-2 viral copies this assay can detect is 138 copies/mL. A negative result does not preclude SARS-Cov-2 infection and should not be used as the sole basis for treatment or other patient management decisions. A negative result may occur with  improper specimen collection/handling, submission of specimen other than nasopharyngeal swab, presence of viral mutation(s) within the areas targeted by this assay, and inadequate number of viral copies(<138 copies/mL). A negative result must be combined with clinical observations, patient history, and epidemiological information. The expected result is Negative.  Fact Sheet for Patients:  BloggerCourse.com  Fact Sheet for Healthcare Providers:  SeriousBroker.it  This test is no t yet approved or cleared by the [place] FDA and  has been authorized for detection and/or diagnosis of SARS-CoV-2 by FDA under an Emergency Use Authorization (EUA). This EUA will remain  in effect (meaning this test can be used) for the duration of the COVID-19 declaration under Section 564(b)(1) of the  Act, 21 U.S.C.section 360bbb-3(b)(1), unless the authorization is terminated  or revoked sooner.       Influenza A by PCR NEGATIVE NEGATIVE Final   Influenza B by PCR NEGATIVE NEGATIVE Final    Comment: (NOTE) The Xpert Xpress SARS-CoV-2/FLU/RSV plus assay is intended as an aid in the diagnosis of influenza from Nasopharyngeal swab specimens and should not be used as a sole basis for treatment. Nasal washings and aspirates are unacceptable for Xpert Xpress SARS-CoV-2/FLU/RSV testing.  Fact Sheet for Patients: BloggerCourse.com  Fact Sheet for Healthcare Providers: SeriousBroker.it  This test is not yet approved or cleared by the [place] FDA and has been authorized for detection and/or diagnosis of SARS-CoV-2 by FDA under an Emergency Use Authorization (EUA). This EUA will remain in effect (meaning this test can be used) for the duration of the COVID-19 declaration under Section 564(b)(1) of the Act, 21 U.S.C. section 360bbb-3(b)(1), unless the authorization is terminated or revoked.  Performed at [facility], [address] [place], [place] [address]     Radiology Studies: No results found.    Gillis Santa, MD [facility]  If 7PM-7AM, please contact night-coverage [email or web address] 09/05/2021, 12:48 PM

## 2021-09-05 NOTE — Progress Notes (Signed)
Progress Note  Patient Name: Tyler Ochoa Date of Encounter: 09/05/2021  Primary Cardiologist: Agbor-Etang  Subjective   Feeling a little better. Dyspnea continues to improve. Lower extremity swelling about the same. No chest pain. Upon to lay completely flat. Documented UOP ~ 1.1 L for the past 24 hours, net - 5.0 L for the admission. Weight 99.2-->100.7 kg over the past 24 hours. Renal function continues to improve. Albumin remains low.   Inpatient Medications    Scheduled Meds:  aspirin EC  81 mg Oral Daily   atorvastatin  80 mg Oral QHS   dapagliflozin propanediol  10 mg Oral QAC breakfast   feeding supplement  237 mL Oral TID BM   heparin injection (subcutaneous)  5,000 Units Subcutaneous Q8H   metoprolol succinate  12.5 mg Oral Q breakfast   potassium chloride SA  20 mEq Oral Daily   potassium chloride  40 mEq Oral Once   Continuous Infusions:  furosemide (LASIX) 200 mg in dextrose 5% 100 mL (2mg /mL) infusion 4 mg/hr (09/03/21 1442)   PRN Meds: acetaminophen **OR** acetaminophen, albuterol, guaiFENesin-dextromethorphan, magnesium hydroxide, ondansetron **OR** ondansetron (ZOFRAN) IV, traZODone   Vital Signs    Vitals:   09/04/21 1938 09/05/21 0048 09/05/21 0347 09/05/21 0554  BP: 100/77 107/77 134/90   Pulse: (!) 101 98 98   Resp: 17 19 17    Temp: 98.7 F (37.1 C) 98.3 F (36.8 C) 98.3 F (36.8 C)   TempSrc:      SpO2: 99% 97% 97%   Weight:    100.7 kg  Height:        Intake/Output Summary (Last 24 hours) at 09/05/2021 0815 Last data filed at 09/05/2021 09/07/2021 Gross per 24 hour  Intake 720.08 ml  Output 2100 ml  Net -1379.92 ml    Filed Weights   09/03/21 0404 09/04/21 0258 09/05/21 0554  Weight: 100.4 kg 99.2 kg 100.7 kg    Telemetry    SR with rare PVCs and 1st degree AV block - Personally Reviewed  ECG    No new tracings - Personally Reviewed  Physical Exam   GEN: No acute distress.   Neck: JVD ~ 8 cm. Cardiac: RRR, no murmurs,  rubs, or gallops.  Respiratory: Diminished breath sounds along the bases bilaterally with faint crackles.  GI: Soft, nontender, non-distended.   MS: Trace bilateral lower extremity edema; No deformity. Neuro:  Alert and oriented x 3; Nonfocal.  Psych: Normal affect.  Labs    Chemistry Recent Labs  Lab 09/03/21 0412 09/04/21 0353 09/05/21 0412  NA 135 133* 132*  K 3.7 3.3* 3.4*  CL 108 104 104  CO2 21* 21* 24  GLUCOSE 111* 105* 111*  BUN 47* 44* 41*  CREATININE 1.63* 1.55* 1.51*  CALCIUM 8.0* 8.0* 8.1*  PROT 6.1* 6.1* 6.2*  ALBUMIN 2.6* 2.4* 2.4*  AST 53* 59* 70*  ALT 48* 42 43  ALKPHOS 118 125 118  BILITOT 3.9* 3.4* 3.4*  GFRNONAA 46* 48* 50*  ANIONGAP 6 8 4*      Hematology Recent Labs  Lab 09/03/21 0412 09/04/21 0353 09/05/21 0412  WBC 4.1 3.9* 4.0  RBC 5.22 4.82 4.79  HGB 15.2 14.6 14.4  HCT 43.4 39.8 39.7  MCV 83.1 82.6 82.9  MCH 29.1 30.3 30.1  MCHC 35.0 36.7* 36.3*  RDW 17.1* 17.1* 17.2*  PLT 160 159 153     Cardiac EnzymesNo results for input(s): TROPONINI in the last 168 hours. No results for input(s): TROPIPOC in  the last 168 hours.   BNP Recent Labs  Lab 08/30/21 0448  BNP 4,339.5*      DDimer No results for input(s): DDIMER in the last 168 hours.   Radiology    No results found.  Cardiac Studies   2D echo 08/29/2021: 1. Left ventricular ejection fraction, by estimation, is <20%. The left  ventricle has severely decreased function. The left ventricle demonstrates  global hypokinesis. There is mild concentric left ventricular hypertrophy.  Left ventricular diastolic  parameters are consistent with Grade III diastolic dysfunction  (restrictive).   2. Right ventricular systolic function is moderately reduced. The right  ventricular size is mildly enlarged.   3. Left atrial size was mildly dilated.   4. Right atrial size was moderately dilated.   5. The mitral valve is normal in structure. Mild mitral valve  regurgitation.   6.  The aortic valve is normal in structure. Aortic valve regurgitation is  not visualized. No aortic stenosis is present.   7. The inferior vena cava is dilated in size with <50% respiratory  variability, suggesting right atrial pressure of 15 mmHg. __________  2D echo 01/07/2021:  1. Left ventricular ejection fraction, by estimation, is 25 to 30%. The  left ventricle has severely decreased function. The left ventricle  demonstrates global hypokinesis. There is severe concentric left  ventricular hypertrophy. Left ventricular  diastolic parameters are indeterminate.   2. Right ventricular systolic function is severely reduced. The right  ventricular size is mildly enlarged. Mildly increased right ventricular  wall thickness.   3. Left atrial size was severely dilated.   4. Right atrial size was severely dilated.   5. A small pericardial effusion is present. The pericardial effusion is  posterior and lateral to the left ventricle.   6. The mitral valve is normal in structure. Mild mitral valve  regurgitation.   7. The aortic valve is tricuspid. Aortic valve regurgitation is not  visualized.   8. Aortic dilatation noted. There is mild dilatation of the aortic root,  measuring 41 mm.   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 consider CMR.  Patient Profile     70 y.o. male with history of HFrEF of uncertain etiology and dates back to at least 01/2021, alcohol abuse with ascites and cirrhosis, CKD stage II-III, HTN, and HLD who we are seeing for acute on chronic HFrEF and elevated troponin.  Assessment & Plan    1. Acute on chronic HFrEF: -Remains volume up -Uncertain etiology and chronicity, dating back to at least 01/2021 by echo -Continue IV diuresis with Lasix gtt until we start seeing signs of intravascular volume depletion   -Continue current GDMT including Farxiga and Toprol XL -Throughout the admission, we have not been able to start ACEi/ARB/ARNI/MRA or BiDil secondary to AKI and relative hypotension -Look to start GDMT if BP remains improved over the next 24 hours  -Previously cMRI has been recommended to evaluate for infiltrative cardiomyopathy, follow up as outpatient   2. Elevated troponin: -Mildly elevated and peaking at 420 -Not consistent with ACS -No indication for heparin gtt -Not currently able to undergo [facility] with AKI -He will need ischemic evaluation, with timing to be determined at this point  3. Ascites with liver cirrhosis with hypoalbuminemia: -Likely in the setting of alcohol abuse -Cessation recommended  -Third spacing  -Per primary service   4. Lower extremity swelling: -Multifactorial including cardiomyopathy, cirrhosis with  hypoalbuminemia and third spacing -CHF management as above -Remaining per IM  5. Acute on CKD stage III: -Renal function improving with diuresis  -Monitor   6. Hypokalemia: -Replete to goal 4.0  For questions or updates, please contact [facility] Please consult [email or web address] for contact info under Cardiology/STEMI.    Signed, Eula Listen, PA-C [facility] Pager: [phone] 09/05/2021, 8:15 AM

## 2021-09-05 NOTE — TOC Progression Note (Signed)
Transition of Care [facility]) - Progression Note    Patient Details  Name: Tyler Ochoa MRN: [id number] Date of Birth: [birth year]  Transition of Care [facility]) CM/SW Contact  Liliana Cline, LCSW Phone Number: 09/05/2021, 3:38 PM  Clinical Narrative:   Started insurance [employer]kley Harvey for [facility] Monday.    Expected Discharge Plan: Skilled Nursing Facility Barriers to Discharge: Continued Medical Work up  Expected Discharge Plan and Services Expected Discharge Plan: Skilled Nursing Facility In-house Referral: Clinical Social Work   Post Acute Care Choice: Skilled Nursing Facility Living arrangements for the past 2 months: Single Family Home                                       Social Determinants of Health (SDOH) Interventions    Readmission Risk Interventions No flowsheet data found.

## 2021-09-06 DIAGNOSIS — I214 Non-ST elevation (NSTEMI) myocardial infarction: Secondary | ICD-10-CM | POA: Diagnosis not present

## 2021-09-06 DIAGNOSIS — N179 Acute kidney failure, unspecified: Secondary | ICD-10-CM | POA: Diagnosis not present

## 2021-09-06 DIAGNOSIS — I5023 Acute on chronic systolic (congestive) heart failure: Secondary | ICD-10-CM | POA: Diagnosis not present

## 2021-09-06 LAB — CBC
HCT: 39.7 % (ref 39.0–52.0)
Hemoglobin: 14.2 g/dL (ref 13.0–17.0)
MCH: 29.5 pg (ref 26.0–34.0)
MCHC: 35.8 g/dL (ref 30.0–36.0)
MCV: 82.5 fL (ref 80.0–100.0)
Platelets: 153 10*3/uL (ref 150–400)
RBC: 4.81 MIL/uL (ref 4.22–5.81)
RDW: 16.8 % — ABNORMAL HIGH (ref 11.5–15.5)
WBC: 3.8 10*3/uL — ABNORMAL LOW (ref 4.0–10.5)
nRBC: 0 % (ref 0.0–0.2)

## 2021-09-06 LAB — COMPREHENSIVE METABOLIC PANEL
ALT: 40 U/L (ref 0–44)
AST: 72 U/L — ABNORMAL HIGH (ref 15–41)
Albumin: 2.3 g/dL — ABNORMAL LOW (ref 3.5–5.0)
Alkaline Phosphatase: 125 U/L (ref 38–126)
Anion gap: 9 (ref 5–15)
BUN: 41 mg/dL — ABNORMAL HIGH (ref 8–23)
CO2: 24 mmol/L (ref 22–32)
Calcium: 7.9 mg/dL — ABNORMAL LOW (ref 8.9–10.3)
Chloride: 100 mmol/L (ref 98–111)
Creatinine, Ser: 1.59 mg/dL — ABNORMAL HIGH (ref 0.61–1.24)
GFR, Estimated: 47 mL/min — ABNORMAL LOW (ref >60)
Glucose, Bld: 109 mg/dL — ABNORMAL HIGH (ref 70–99)
Potassium: 3.6 mmol/L (ref 3.5–5.1)
Sodium: 133 mmol/L — ABNORMAL LOW (ref 135–145)
Total Bilirubin: 3.2 mg/dL — ABNORMAL HIGH (ref 0.3–1.2)
Total Protein: 6.1 g/dL — ABNORMAL LOW (ref 6.5–8.1)

## 2021-09-06 LAB — MAGNESIUM: Magnesium: 1.9 mg/dL (ref 1.7–2.4)

## 2021-09-06 LAB — PHOSPHORUS: Phosphorus: 2.2 mg/dL — ABNORMAL LOW (ref 2.5–4.6)

## 2021-09-06 MED ORDER — POTASSIUM PHOSPHATES 15 MMOLE/5ML IV SOLN
30.0000 mmol | Freq: Once | INTRAVENOUS | Status: AC
  Administered 2021-09-06: 30 mmol via INTRAVENOUS
  Filled 2021-09-06: qty 10

## 2021-09-06 NOTE — Progress Notes (Signed)
PROGRESS NOTE  Tyler Ochoa:[id number] DOB: 1951-09-20   PCP: Queen Slough, MD  Patient is from:.  [place] rolling walker and cane.  Lives with his girlfriend.  DOA: 08/28/2021 LOS: 9  Chief complaints:  Chief Complaint  Patient presents with   Weakness   Ascites     Brief Narrative / Interim history: 70 year old male with PMH of systolic CHF, chronic edema, osteoarthritis, debility, HTN and HLD presenting with generalized weakness, dry cough, DOE, BLE edema and orthopnea, and admitted with acute on chronic systolic CHF, possible non-STEMI and AKI.  BNP elevated to 3200.  Troponin elevated to 420 and improved to 327.  Started on IV Lasix and IV heparin.  Cardiology consulted.  TTE with LVEF <20% (25 to 30% in 01/2021), G3 DD and moderate RAE.  No further cardiac or pulmonary evaluation per cardiologist.  Therapy recommended SNF.  Subjective: No significant event overnight, patient was resting comfortably and sleepy today, denied any worsening of shortness of breath, lower extremity edema is improving, Denies any chest pain or palpitations, no any other active issues.   Objective: Vitals:   09/06/21 0411 09/06/21 0436 09/06/21 0720 09/06/21 1020  BP: 101/71  101/70 104/73  Pulse: 98  98 98  Resp: 18  20 17   Temp: 98.4 F (36.9 C)  98.7 F (37.1 C) 98.7 F (37.1 C)  TempSrc:      SpO2: 95%  95% 96%  Weight:  98.3 kg    Height:        Intake/Output Summary (Last 24 hours) at 09/06/2021 1344 Last data filed at 09/06/2021 1100 Gross per 24 hour  Intake 393.05 ml  Output 3250 ml  Net -2856.95 ml   Filed Weights   09/04/21 0258 09/05/21 0554 09/06/21 0436  Weight: 99.2 kg 100.7 kg 98.3 kg    Examination:  GENERAL: No apparent distress.  Nontoxic. HEENT: MMM.  Vision and hearing grossly intact.  NECK: Supple.  No apparent JVD.  RESP: ctab save for mild rales at bases CVS:  RRR. Heart sounds normal.  ABD/GI/GU: BS+. Abd soft, mildly distended due to ascites,  nontender.  MSK/EXT:  Moves extremities. No apparent deformity.  2-3+ BLE edema SKIN: no apparent skin lesion or wound NEURO: Awake and alert. Oriented appropriately.  No apparent focal neuro deficit. PSYCH: Calm. Normal affect.   Procedures:  None  Microbiology summarized: COVID-19 and influenza PCR nonreactive.  Assessment & Plan: Acute on chronic combined CHF: TTE with LVEF of <20% (25 to 30% in 01/2021), G3 DD and moderate RAE.  Reportedly had generalized weakness, DOE, 3 pillow orthopnea and BLE edema on presentation although  patient denies significant acute change.  BNP 3200>> 4400.  CXR with stable cardiomegaly.  Elevated troponin likely from demand ischemia as well. -No further cardiac evaluation from cardiology standpoint, needs outpt f/u with dr. 02/2021 -s/p IV Lasix 40 mg daily, it was decreased due to elevated creatinine.  10/25 started Lasix IV infusion for gradual continuous diuresis.   -GDM, pt was on Entresto which was held due to low bp,  continue Farxiga, decrease Toprol-XL from 50 to 12.5 mg p.o. daily due to low blood pressure -Monitor fluid status, renal functions and electrolytes. -low Sodium diet and fluid restriction 1.5 L per day. -Palliative medicine consulted for goal of care discussion. Patient was seen by cardiology Dr. 11-12-2002, patient is noncompliant with follow-ups, patient wants to switch cardiologist, spoke to Dr. Welton Flakes and he is okay with that. Discussed with cardiologist [facility] who  will follow patient and will schedule appointment as an outpatient as well.     Liver cirrhosis with ascites found on CT abdomen pelvis,  Unknown cause of liver cirrhosis could be due to congestion secondary to CHF. Continue diuretic as above  ammonia level wnl   Elevated troponin without significant delta: Likely demand ischemia.  No RWMA on TTE. -Continue statin, aspirin and beta-blocker  AKI/azotemia: Cardiorenal?  Base creatinine appears to be mid 1s, up to 1.51  today -Continue monitoring - diuresis as above  Hypokalemia, potassium repleted. Hypophosphatemia, phos repleted.  Elevated liver enzymes/hyperbilirubinemia/coagulopathy: CK within normal.  Denies EtOH.  Likely congestive hepatopathy as improving w/ diuresis. Appears to have hx of elevated bilirubin Recent Labs  Lab 09/02/21 0220 09/03/21 0412 09/04/21 0353 09/05/21 0412 09/06/21 0424  AST 52* 53* 59* 70* 72*  ALT 50* 48* 42 43 40  ALKPHOS 127* 118 125 118 125  BILITOT 3.7* 3.9* 3.4* 3.4* 3.2*  PROT 6.3* 6.1* 6.1* 6.2* 6.1*  ALBUMIN 2.7* 2.6* 2.4* 2.4* 2.3*  -Continue monitoring LFTs - RUQ u/s shows suspected liver cirrhosis, mild ascites and right renal mass, recommended CT or MRI CT scan abdomen pelvis shows liver cirrhosis, ascites could not rule out renal mass due to noncontrast study, recommended CT scan with contrast versus MRI with contrast.   Essential hypertension: Normotensive. -Cardiac meds as above  Hyperlipidemia:  -Continue statin.  Ambulatory dysfunction/knee osteoarthritis -Therapy recommended SNF, patient interested, toc to pursue  BLE edema, due to chf, Liver cirrhosis and low albumin, third spacing. Dopplers neg for dvt Continue to wear TED stockings, and diuretics as above  Thrombocytopenia: Stable, resolved Recent Labs  Lab 09/02/21 0220 09/03/21 0412 09/04/21 0353 09/05/21 0412 09/06/21 0424  PLT 153 160 159 153 153  -Continue monitoring  Right renal mass, patient was recommended to follow with PCP to repeat CT abdomen pelvis with contrast when renal functions improved versus MRI as an outpatient and follow-up with urology as an outpatient  PT and OT eval done, recommend SNF placement.  Discussed with case manager and social worker, insurance Auth has been started, most likely patient will be discharged on Monday.   Goal of care counseling: Patient with advanced CHF and other comorbidity as above.  Poor long-term prognosis.  Hospice  candidate.  However, patient still full code and prefers to remain full code despite risk and benefit discussion.  -Palliative medicine consulted.  Body mass index is 28.02 kg/m.   Pressure Injury 08/29/21 Buttocks Medial Stage 2 -  Partial thickness loss of dermis presenting as a shallow open injury with a red, pink wound bed without slough. (Active)  08/29/21 2109  Location: Buttocks  Location Orientation: Medial  Staging: Stage 2 -  Partial thickness loss of dermis presenting as a shallow open injury with a red, pink wound bed without slough.  Wound Description (Comments):   Present on Admission: Yes   DVT prophylaxis:  Place TED hose Start: 09/01/21 1055 heparin injection 5,000 Units Start: 08/29/21 2200  Code Status: Full code Family Communication: Patient and/or RN. Available if any question.  Level of care: Progressive Cardiac Status is: Inpatient  Remains inpatient appropriate because: Safe disposition/SNF in 1-2 days     Consultants:  Cardiology   Sch Meds:  Scheduled Meds:  aspirin EC  81 mg Oral Daily   atorvastatin  80 mg Oral QHS   dapagliflozin propanediol  10 mg Oral QAC breakfast   feeding supplement  237 mL Oral TID BM   heparin  injection (subcutaneous)  5,000 Units Subcutaneous Q8H   metoprolol succinate  12.5 mg Oral Q breakfast   potassium chloride SA  20 mEq Oral Daily   Continuous Infusions:  furosemide (LASIX) 200 mg in dextrose 5% 100 mL (2mg /mL) infusion 4 mg/hr (09/06/21 0333)   potassium PHOSPHATE IVPB (in mmol) 30 mmol (09/06/21 1102)    PRN Meds:.acetaminophen **OR** acetaminophen, albuterol, guaiFENesin-dextromethorphan, magnesium hydroxide, ondansetron **OR** ondansetron (ZOFRAN) IV, traZODone  Antimicrobials: Anti-infectives (From admission, onward)    None        I have personally reviewed the following labs and images: CBC: Recent Labs  Lab 09/02/21 0220 09/03/21 0412 09/04/21 0353 09/05/21 0412 09/06/21 0424  WBC  3.4* 4.1 3.9* 4.0 3.8*  HGB 15.0 15.2 14.6 14.4 14.2  HCT 41.6 43.4 39.8 39.7 39.7  MCV 83.5 83.1 82.6 82.9 82.5  PLT 153 160 159 153 153   BMP &GFR Recent Labs  Lab 09/02/21 0220 09/03/21 0412 09/04/21 0353 09/05/21 0412 09/06/21 0424  NA 132* 135 133* 132* 133*  K 3.7 3.7 3.3* 3.4* 3.6  CL 105 108 104 104 100  CO2 19* 21* 21* 24 24  GLUCOSE 108* 111* 105* 111* 109*  BUN 51* 47* 44* 41* 41*  CREATININE 1.58* 1.63* 1.55* 1.51* 1.59*  CALCIUM 8.1* 8.0* 8.0* 8.1* 7.9*  MG 2.0 2.1 2.1 1.9 1.9  PHOS 2.7 2.5 2.2* 2.6 2.2*   Estimated Creatinine Clearance: 51.7 mL/min (A) (by C-G formula based on SCr of 1.59 mg/dL (H)). Liver & Pancreas: Recent Labs  Lab 09/02/21 0220 09/03/21 0412 09/04/21 0353 09/05/21 0412 09/06/21 0424  AST 52* 53* 59* 70* 72*  ALT 50* 48* 42 43 40  ALKPHOS 127* 118 125 118 125  BILITOT 3.7* 3.9* 3.4* 3.4* 3.2*  PROT 6.3* 6.1* 6.1* 6.2* 6.1*  ALBUMIN 2.7* 2.6* 2.4* 2.4* 2.3*   No results for input(s): LIPASE, AMYLASE in the last 168 hours. Recent Labs  Lab 09/02/21 1527 09/03/21 0412  AMMONIA 30 22   Diabetic: No results for input(s): HGBA1C in the last 72 hours. No results for input(s): GLUCAP in the last 168 hours. Cardiac Enzymes: No results for input(s): CKTOTAL, CKMB, CKMBINDEX, TROPONINI in the last 168 hours.  No results for input(s): PROBNP in the last 8760 hours. Coagulation Profile: No results for input(s): INR, PROTIME in the last 168 hours.  Thyroid Function Tests: No results for input(s): TSH, T4TOTAL, FREET4, T3FREE, THYROIDAB in the last 72 hours. Lipid Profile: No results for input(s): CHOL, HDL, LDLCALC, TRIG, CHOLHDL, LDLDIRECT in the last 72 hours. Anemia Panel: No results for input(s): VITAMINB12, FOLATE, FERRITIN, TIBC, IRON, RETICCTPCT in the last 72 hours. Urine analysis:    Component Value Date/Time   COLORURINE YELLOW (A) 03/07/2021 1705   APPEARANCEUR CLEAR (A) 03/07/2021 1705   LABSPEC 1.010 03/07/2021 1705    PHURINE 5.0 03/07/2021 1705   GLUCOSEU >=500 (A) 03/07/2021 1705   HGBUR SMALL (A) 03/07/2021 1705   BILIRUBINUR NEGATIVE 03/07/2021 1705   KETONESUR NEGATIVE 03/07/2021 1705   PROTEINUR 30 (A) 03/07/2021 1705   NITRITE NEGATIVE 03/07/2021 1705   LEUKOCYTESUR NEGATIVE 03/07/2021 1705   Sepsis Labs: Invalid input(s): PROCALCITONIN, LACTICIDVEN  Microbiology: Recent Results (from the past 240 hour(s))  Resp Panel by RT-PCR (Flu A&B, Covid) Nasopharyngeal Swab     Status: None   Collection Time: 08/28/21  9:43 PM   Specimen: Nasopharyngeal Swab; Nasopharyngeal(NP) swabs in vial transport medium  Result Value Ref Range Status   SARS Coronavirus 2 by  RT PCR NEGATIVE NEGATIVE Final    Comment: (NOTE) SARS-CoV-2 target nucleic acids are NOT DETECTED.  The SARS-CoV-2 RNA is generally detectable in upper respiratory specimens during the acute phase of infection. The lowest concentration of SARS-CoV-2 viral copies this assay can detect is 138 copies/mL. A negative result does not preclude SARS-Cov-2 infection and should not be used as the sole basis for treatment or other patient management decisions. A negative result may occur with  improper specimen collection/handling, submission of specimen other than nasopharyngeal swab, presence of viral mutation(s) within the areas targeted by this assay, and inadequate number of viral copies(<138 copies/mL). A negative result must be combined with clinical observations, patient history, and epidemiological information. The expected result is Negative.  Fact Sheet for Patients:  BloggerCourse.com  Fact Sheet for Healthcare Providers:  SeriousBroker.it  This test is no t yet approved or cleared by the [place] FDA and  has been authorized for detection and/or diagnosis of SARS-CoV-2 by FDA under an Emergency Use Authorization (EUA). This EUA will remain  in effect (meaning this test can  be used) for the duration of the COVID-19 declaration under Section 564(b)(1) of the Act, 21 U.S.C.section 360bbb-3(b)(1), unless the authorization is terminated  or revoked sooner.       Influenza A by PCR NEGATIVE NEGATIVE Final   Influenza B by PCR NEGATIVE NEGATIVE Final    Comment: (NOTE) The Xpert Xpress SARS-CoV-2/FLU/RSV plus assay is intended as an aid in the diagnosis of influenza from Nasopharyngeal swab specimens and should not be used as a sole basis for treatment. Nasal washings and aspirates are unacceptable for Xpert Xpress SARS-CoV-2/FLU/RSV testing.  Fact Sheet for Patients: BloggerCourse.com  Fact Sheet for Healthcare Providers: SeriousBroker.it  This test is not yet approved or cleared by the [place] FDA and has been authorized for detection and/or diagnosis of SARS-CoV-2 by FDA under an Emergency Use Authorization (EUA). This EUA will remain in effect (meaning this test can be used) for the duration of the COVID-19 declaration under Section 564(b)(1) of the Act, 21 U.S.C. section 360bbb-3(b)(1), unless the authorization is terminated or revoked.  Performed at [facility], [address], [place], [place] [address]     Radiology Studies: No results found.    Gillis Santa, MD [facility]  If 7PM-7AM, please contact night-coverage [email or web address] 09/06/2021, 1:44 PM

## 2021-09-06 NOTE — Progress Notes (Signed)
Progress Note  Patient Name: Tyler Ochoa Date of Encounter: 09/06/2021  Primary Cardiologist: Agbor-Etang  Subjective   Dyspnea and lower extremity swelling continue to improve. No chest pain. Documented UOP ~ 1.7 L for the past 24 hours, net - 6.4 L for the admission. Weight 100.7-->98.3 kg over the past 24 hours. Renal function largely stable. Albumin remains low.he is uncertain if he would want to undergo a cardiac cath.   Inpatient Medications    Scheduled Meds:  aspirin EC  81 mg Oral Daily   atorvastatin  80 mg Oral QHS   dapagliflozin propanediol  10 mg Oral QAC breakfast   feeding supplement  237 mL Oral TID BM   heparin injection (subcutaneous)  5,000 Units Subcutaneous Q8H   metoprolol succinate  12.5 mg Oral Q breakfast   potassium chloride SA  20 mEq Oral Daily   Continuous Infusions:  furosemide (LASIX) 200 mg in dextrose 5% 100 mL (2mg /mL) infusion 4 mg/hr (09/06/21 0333)   PRN Meds: acetaminophen **OR** acetaminophen, albuterol, guaiFENesin-dextromethorphan, magnesium hydroxide, ondansetron **OR** ondansetron (ZOFRAN) IV, traZODone   Vital Signs    Vitals:   09/06/21 0007 09/06/21 0030 09/06/21 0411 09/06/21 0436  BP: 100/70 107/86 101/71   Pulse: (!) 101 98 98   Resp: 18 18 18    Temp: 98 F (36.7 C) 98.2 F (36.8 C) 98.4 F (36.9 C)   TempSrc:      SpO2: 100% 99% 95%   Weight:    98.3 kg  Height:        Intake/Output Summary (Last 24 hours) at 09/06/2021 0751 Last data filed at 09/06/2021 0439 Gross per 24 hour  Intake 993.34 ml  Output 2700 ml  Net -1706.66 ml    Filed Weights   09/04/21 0258 09/05/21 0554 09/06/21 0436  Weight: 99.2 kg 100.7 kg 98.3 kg    Telemetry    SR with rare PVCs and 1st degree AV block - Personally Reviewed  ECG    No new tracings - Personally Reviewed  Physical Exam   GEN: No acute distress.   Neck: JVD ~ 8 cm. Cardiac: RRR, no murmurs, rubs, or gallops.  Respiratory: Improving breath sounds.   GI: Soft, nontender, non-distended.   MS: Trace bilateral lower extremity edema; No deformity. Neuro:  Alert and oriented x 3; Nonfocal.  Psych: Normal affect.  Labs    Chemistry Recent Labs  Lab 09/04/21 0353 09/05/21 0412 09/06/21 0424  NA 133* 132* 133*  K 3.3* 3.4* 3.6  CL 104 104 100  CO2 21* 24 24  GLUCOSE 105* 111* 109*  BUN 44* 41* 41*  CREATININE 1.55* 1.51* 1.59*  CALCIUM 8.0* 8.1* 7.9*  PROT 6.1* 6.2* 6.1*  ALBUMIN 2.4* 2.4* 2.3*  AST 59* 70* 72*  ALT 42 43 40  ALKPHOS 125 118 125  BILITOT 3.4* 3.4* 3.2*  GFRNONAA 48* 50* 47*  ANIONGAP 8 4* 9      Hematology Recent Labs  Lab 09/04/21 0353 09/05/21 0412 09/06/21 0424  WBC 3.9* 4.0 3.8*  RBC 4.82 4.79 4.81  HGB 14.6 14.4 14.2  HCT 39.8 39.7 39.7  MCV 82.6 82.9 82.5  MCH 30.3 30.1 29.5  MCHC 36.7* 36.3* 35.8  RDW 17.1* 17.2* 16.8*  PLT 159 153 153     Cardiac EnzymesNo results for input(s): TROPONINI in the last 168 hours. No results for input(s): TROPIPOC in the last 168 hours.   BNP No results for input(s): BNP, PROBNP in the last 168  hours.    DDimer No results for input(s): DDIMER in the last 168 hours.   Radiology    No results found.  Cardiac Studies   2D echo 08/29/2021: 1. Left ventricular ejection fraction, by estimation, is <20%. The left  ventricle has severely decreased function. The left ventricle demonstrates  global hypokinesis. There is mild concentric left ventricular hypertrophy.  Left ventricular diastolic  parameters are consistent with Grade III diastolic dysfunction  (restrictive).   2. Right ventricular systolic function is moderately reduced. The right  ventricular size is mildly enlarged.   3. Left atrial size was mildly dilated.   4. Right atrial size was moderately dilated.   5. The mitral valve is normal in structure. Mild mitral valve  regurgitation.   6. The aortic valve is normal in structure. Aortic valve regurgitation is  not visualized. No aortic  stenosis is present.   7. The inferior vena cava is dilated in size with <50% respiratory  variability, suggesting right atrial pressure of 15 mmHg. __________  2D echo 01/07/2021:  1. Left ventricular ejection fraction, by estimation, is 25 to 30%. The  left ventricle has severely decreased function. The left ventricle  demonstrates global hypokinesis. There is severe concentric left  ventricular hypertrophy. Left ventricular  diastolic parameters are indeterminate.   2. Right ventricular systolic function is severely reduced. The right  ventricular size is mildly enlarged. Mildly increased right ventricular  wall thickness.   3. Left atrial size was severely dilated.   4. Right atrial size was severely dilated.   5. A small pericardial effusion is present. The pericardial effusion is  posterior and lateral to the left ventricle.   6. The mitral valve is normal in structure. Mild mitral valve  regurgitation.   7. The aortic valve is tricuspid. Aortic valve regurgitation is not  visualized.   8. Aortic dilatation noted. There is mild dilatation of the aortic root,  measuring 41 mm.   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 consider CMR.  Patient Profile     70 y.o. male with history of HFrEF of uncertain etiology and dates back to at least 01/2021, alcohol abuse with ascites and cirrhosis, CKD stage II-III, HTN, and HLD who we are seeing for acute on chronic HFrEF and elevated troponin.  Assessment & Plan    1. Acute on chronic HFrEF: -Remains volume up -Uncertain etiology and chronicity, dating back to at least 01/2021 by echo -Remains on Lasix gtt with good UOP and symptomatic improvement  -Continue current GDMT including Farxiga and Toprol XL -Throughout the admission, we have not been able to start ACEi/ARB/ARNI/MRA  or BiDil secondary to AKI and relative hypotension -Look to start GDMT if BP remains improved over the next 24 hours  -Previously cMRI has been recommended to evaluate for infiltrative cardiomyopathy, follow up as outpatient  -Recommend R/LHC, as long as renal function remains stable, though he is uncertain if he would want to proceed with this time and wants time to think on it  2. Elevated troponin: -Mildly elevated and peaking at 420 -Not consistent with ACS -No indication for heparin gtt -He will need R/LHC as above pending renal function trend, if he agrees   3. Ascites with liver cirrhosis with hypoalbuminemia: -Likely in the setting of alcohol abuse -Cessation recommended  -Third spacing  -Per primary service   4. Lower extremity swelling: -Multifactorial including cardiomyopathy, cirrhosis with  hypoalbuminemia and third spacing -CHF management as above -Remaining per IM  5. Acute on CKD stage III: -Renal function stable -Monitor   6. Hypokalemia: -Improving  For questions or updates, please contact [facility] Please consult [email or web address] for contact info under Cardiology/STEMI.    Signed, Eula Listen, PA-C [facility] Pager: [phone] 09/06/2021, 7:51 AM

## 2021-09-06 NOTE — TOC Progression Note (Signed)
Transition of Care [facility]) - Progression Note    Patient Details  Name: ARBY DAHIR MRN: [id number] Date of Birth: 14-Oct-1951  [facility]) CM/SW Contact  Liliana Cline, LCSW Phone Number: 09/06/2021, 11:21 AM  Clinical Narrative:   Asked MD and RN for COVID test on patient.    Expected Discharge Plan: Skilled Nursing Facility Barriers to Discharge: Continued Medical Work up  Expected Discharge Plan and Services Expected Discharge Plan: Skilled Nursing Facility In-house Referral: Clinical Social Work   Post Acute Care Choice: Skilled Nursing Facility Living arrangements for the past 2 months: Single Family Home                                       Social Determinants of Health (SDOH) Interventions    Readmission Risk Interventions No flowsheet data found.

## 2021-09-07 DIAGNOSIS — I5023 Acute on chronic systolic (congestive) heart failure: Secondary | ICD-10-CM | POA: Diagnosis not present

## 2021-09-07 LAB — CBC
HCT: 42.8 % (ref 39.0–52.0)
Hemoglobin: 15.5 g/dL (ref 13.0–17.0)
MCH: 30.2 pg (ref 26.0–34.0)
MCHC: 36.2 g/dL — ABNORMAL HIGH (ref 30.0–36.0)
MCV: 83.4 fL (ref 80.0–100.0)
Platelets: 136 10*3/uL — ABNORMAL LOW (ref 150–400)
RBC: 5.13 MIL/uL (ref 4.22–5.81)
RDW: 17.1 % — ABNORMAL HIGH (ref 11.5–15.5)
WBC: 4.5 10*3/uL (ref 4.0–10.5)
nRBC: 0 % (ref 0.0–0.2)

## 2021-09-07 LAB — COMPREHENSIVE METABOLIC PANEL
ALT: 41 U/L (ref 0–44)
AST: 88 U/L — ABNORMAL HIGH (ref 15–41)
Albumin: 2.5 g/dL — ABNORMAL LOW (ref 3.5–5.0)
Alkaline Phosphatase: 141 U/L — ABNORMAL HIGH (ref 38–126)
Anion gap: 9 (ref 5–15)
BUN: 42 mg/dL — ABNORMAL HIGH (ref 8–23)
CO2: 25 mmol/L (ref 22–32)
Calcium: 8.2 mg/dL — ABNORMAL LOW (ref 8.9–10.3)
Chloride: 98 mmol/L (ref 98–111)
Creatinine, Ser: 1.46 mg/dL — ABNORMAL HIGH (ref 0.61–1.24)
GFR, Estimated: 52 mL/min — ABNORMAL LOW (ref >60)
Glucose, Bld: 134 mg/dL — ABNORMAL HIGH (ref 70–99)
Potassium: 4 mmol/L (ref 3.5–5.1)
Sodium: 132 mmol/L — ABNORMAL LOW (ref 135–145)
Total Bilirubin: 3 mg/dL — ABNORMAL HIGH (ref 0.3–1.2)
Total Protein: 6.5 g/dL (ref 6.5–8.1)

## 2021-09-07 LAB — PHOSPHORUS: Phosphorus: 2.7 mg/dL (ref 2.5–4.6)

## 2021-09-07 LAB — SARS CORONAVIRUS 2 (TAT 6-24 HRS): SARS Coronavirus 2: NEGATIVE

## 2021-09-07 LAB — MAGNESIUM: Magnesium: 1.9 mg/dL (ref 1.7–2.4)

## 2021-09-07 NOTE — H&P (View-Only) (Signed)
Progress Note  Patient Name: Tyler Ochoa Date of Encounter: 09/07/2021  [facility] Cardiologist: Agbor-Etang  Subjective   Feeling better today with minimal shortness of breath and leg swelling.  No chest pain.  Inpatient Medications    Scheduled Meds:  aspirin EC  81 mg Oral Daily   atorvastatin  80 mg Oral QHS   dapagliflozin propanediol  10 mg Oral QAC breakfast   feeding supplement  237 mL Oral TID BM   heparin injection (subcutaneous)  5,000 Units Subcutaneous Q8H   metoprolol succinate  12.5 mg Oral Q breakfast   potassium chloride SA  20 mEq Oral Daily   Continuous Infusions:  furosemide (LASIX) 200 mg in dextrose 5% 100 mL (2mg /mL) infusion 4 mg/hr (09/06/21 1854)   PRN Meds: acetaminophen **OR** acetaminophen, albuterol, guaiFENesin-dextromethorphan, magnesium hydroxide, ondansetron **OR** ondansetron (ZOFRAN) IV, traZODone   Vital Signs    Vitals:   09/06/21 2000 09/07/21 0032 09/07/21 0553 09/07/21 0724  BP: 107/85 106/81 109/75 107/76  Pulse: (!) 104 100 100 79  Resp: 18 18 (!) 24 20  Temp:  99.6 F (37.6 C) 98.9 F (37.2 C) 98.7 F (37.1 C)  TempSrc:      SpO2: 95% 98% 95% 95%  Weight:   97.5 kg   Height:        Intake/Output Summary (Last 24 hours) at 09/07/2021 0920 Last data filed at 09/07/2021 0913 Gross per 24 hour  Intake 1376.72 ml  Output 3850 ml  Net -2473.28 ml   Last 3 Weights 09/07/2021 09/06/2021 09/05/2021  Weight (lbs) 214 lb 14.4 oz 216 lb 11.4 oz 222 lb 0.1 oz  Weight (kg) 97.478 kg 98.3 kg 100.7 kg      Telemetry    Normal sinus rhythm with PVCs - Personally Reviewed  ECG    No new tracing. Physical Exam   GEN: No acute distress.   Neck: JVP approximately 8 cm with positive HJR. Cardiac: RRR, 2/6 systolic murmur. Respiratory: Clear to auscultation bilaterally. GI: Soft, nontender, non-distended  MS: 1+ ankle edema bilaterally; No deformity. Neuro:  Nonfocal  Psych: Normal affect   Labs    High  Sensitivity Troponin:   Recent Labs  Lab 08/28/21 1506 08/28/21 2120  TROPONINIHS 420* 327*     Chemistry Recent Labs  Lab 09/05/21 0412 09/06/21 0424 09/07/21 0414  NA 132* 133* 132*  K 3.4* 3.6 4.0  CL 104 100 98  CO2 24 24 25   GLUCOSE 111* 109* 134*  BUN 41* 41* 42*  CREATININE 1.51* 1.59* 1.46*  CALCIUM 8.1* 7.9* 8.2*  MG 1.9 1.9 1.9  PROT 6.2* 6.1* 6.5  ALBUMIN 2.4* 2.3* 2.5*  AST 70* 72* 88*  ALT 43 40 41  ALKPHOS 118 125 141*  BILITOT 3.4* 3.2* 3.0*  GFRNONAA 50* 47* 52*  ANIONGAP 4* 9 9    Lipids No results for input(s): CHOL, TRIG, HDL, LABVLDL, LDLCALC, CHOLHDL in the last 168 hours.  Hematology Recent Labs  Lab 09/05/21 0412 09/06/21 0424 09/07/21 0414  WBC 4.0 3.8* 4.5  RBC 4.79 4.81 5.13  HGB 14.4 14.2 15.5  HCT 39.7 39.7 42.8  MCV 82.9 82.5 83.4  MCH 30.1 29.5 30.2  MCHC 36.3* 35.8 36.2*  RDW 17.2* 16.8* 17.1*  PLT 153 153 136*   Thyroid No results for input(s): TSH, FREET4 in the last 168 hours.  BNPNo results for input(s): BNP, PROBNP in the last 168 hours.  DDimer No results for input(s): DDIMER in the last 168  hours.   Radiology    No results found.  Cardiac Studies   TTE (08/29/2021):  1. Left ventricular ejection fraction, by estimation, is <20%. The left  ventricle has severely decreased function. The left ventricle demonstrates  global hypokinesis. There is mild concentric left ventricular hypertrophy.  Left ventricular diastolic  parameters are consistent with Grade III diastolic dysfunction  (restrictive).   2. Right ventricular systolic function is moderately reduced. The right  ventricular size is mildly enlarged.   3. Left atrial size was mildly dilated.   4. Right atrial size was moderately dilated.   5. The mitral valve is normal in structure. Mild mitral valve  regurgitation.   6. The aortic valve is normal in structure. Aortic valve regurgitation is  not visualized. No aortic stenosis is present.   7. The inferior  vena cava is dilated in size with <50% respiratory  variability, suggesting right atrial pressure of 15 mmHg.  Patient Profile     70 y.o. male with history of HFrEF of uncertain etiology and dates back to at least 01/2021, alcohol abuse with ascites and cirrhosis, CKD stage II-III, HTN, and HLD who we are seeing for acute on chronic HFrEF and elevated troponin.  Assessment & Plan    Acute on chronic HFrEF: Volume status improving though some edema and JVD persist.  Renal function improving with continued diuresis. -Continue furosemide infusion at 4 mg/h. -Continue metoprolol succinate 12.5 mg daily. -Continue to pegol flows and 10 mg daily. -Plan to add ARB in the next 1 to 2 days if blood pressure and renal function tolerate. -Consider adding spironolactone when renal function levels off. -Plan for right and left heart catheterization with possible PCI tomorrow.  Demand ischemia: No angina reported.  Suspect mild troponin elevation reflects supply-demand mismatch. [facility] tomorrow. -Continue ASA and statin pending catheterization.  Cirrhosis with ascites and hypoalbuminemia: -Encourage alcohol cessation. -Right heart catheterization tomorrow to assess PA pressures. -Anticipate adding spironolactone when renal function levels off.  Acute kidney injury superimposed on chronic kidney disease stage III: Renal function gradually improving with continued diuresis. -Continue diuresis with careful monitoring of renal function. -Avoid nephrotoxic drugs.  Shared Decision Making/Informed Consent The risks [stroke (1 in 1000), death (1 in 1000), kidney failure [usually temporary] (1 in 500), bleeding (1 in 200), allergic reaction [possibly serious] (1 in 200)], benefits (diagnostic support and management of coronary artery disease) and alternatives of a cardiac catheterization were discussed in detail with Tyler Ochoa and he is willing to proceed.    For questions or updates, please contact  [facility] Please consult [email or web address] for contact info under [facility].    Signed, Tyler Kendall, MD  09/07/2021, 9:20 AM

## 2021-09-07 NOTE — Progress Notes (Signed)
Progress Note  Patient Name: Tyler Ochoa Date of Encounter: 09/07/2021  [facility] Cardiologist: Agbor-Etang  Subjective   Feeling better today with minimal shortness of breath and leg swelling.  No chest pain.  Inpatient Medications    Scheduled Meds:  aspirin EC  81 mg Oral Daily   atorvastatin  80 mg Oral QHS   dapagliflozin propanediol  10 mg Oral QAC breakfast   feeding supplement  237 mL Oral TID BM   heparin injection (subcutaneous)  5,000 Units Subcutaneous Q8H   metoprolol succinate  12.5 mg Oral Q breakfast   potassium chloride SA  20 mEq Oral Daily   Continuous Infusions:  furosemide (LASIX) 200 mg in dextrose 5% 100 mL (2mg /mL) infusion 4 mg/hr (09/06/21 1854)   PRN Meds: acetaminophen **OR** acetaminophen, albuterol, guaiFENesin-dextromethorphan, magnesium hydroxide, ondansetron **OR** ondansetron (ZOFRAN) IV, traZODone   Vital Signs    Vitals:   09/06/21 2000 09/07/21 0032 09/07/21 0553 09/07/21 0724  BP: 107/85 106/81 109/75 107/76  Pulse: (!) 104 100 100 79  Resp: 18 18 (!) 24 20  Temp:  99.6 F (37.6 C) 98.9 F (37.2 C) 98.7 F (37.1 C)  TempSrc:      SpO2: 95% 98% 95% 95%  Weight:   97.5 kg   Height:        Intake/Output Summary (Last 24 hours) at 09/07/2021 0920 Last data filed at 09/07/2021 0913 Gross per 24 hour  Intake 1376.72 ml  Output 3850 ml  Net -2473.28 ml   Last 3 Weights 09/07/2021 09/06/2021 09/05/2021  Weight (lbs) 214 lb 14.4 oz 216 lb 11.4 oz 222 lb 0.1 oz  Weight (kg) 97.478 kg 98.3 kg 100.7 kg      Telemetry    Normal sinus rhythm with PVCs - Personally Reviewed  ECG    No new tracing. Physical Exam   GEN: No acute distress.   Neck: JVP approximately 8 cm with positive HJR. Cardiac: RRR, 2/6 systolic murmur. Respiratory: Clear to auscultation bilaterally. GI: Soft, nontender, non-distended  MS: 1+ ankle edema bilaterally; No deformity. Neuro:  Nonfocal  Psych: Normal affect   Labs    High  Sensitivity Troponin:   Recent Labs  Lab 08/28/21 1506 08/28/21 2120  TROPONINIHS 420* 327*     Chemistry Recent Labs  Lab 09/05/21 0412 09/06/21 0424 09/07/21 0414  NA 132* 133* 132*  K 3.4* 3.6 4.0  CL 104 100 98  CO2 24 24 25   GLUCOSE 111* 109* 134*  BUN 41* 41* 42*  CREATININE 1.51* 1.59* 1.46*  CALCIUM 8.1* 7.9* 8.2*  MG 1.9 1.9 1.9  PROT 6.2* 6.1* 6.5  ALBUMIN 2.4* 2.3* 2.5*  AST 70* 72* 88*  ALT 43 40 41  ALKPHOS 118 125 141*  BILITOT 3.4* 3.2* 3.0*  GFRNONAA 50* 47* 52*  ANIONGAP 4* 9 9    Lipids No results for input(s): CHOL, TRIG, HDL, LABVLDL, LDLCALC, CHOLHDL in the last 168 hours.  Hematology Recent Labs  Lab 09/05/21 0412 09/06/21 0424 09/07/21 0414  WBC 4.0 3.8* 4.5  RBC 4.79 4.81 5.13  HGB 14.4 14.2 15.5  HCT 39.7 39.7 42.8  MCV 82.9 82.5 83.4  MCH 30.1 29.5 30.2  MCHC 36.3* 35.8 36.2*  RDW 17.2* 16.8* 17.1*  PLT 153 153 136*   Thyroid No results for input(s): TSH, FREET4 in the last 168 hours.  BNPNo results for input(s): BNP, PROBNP in the last 168 hours.  DDimer No results for input(s): DDIMER in the last 168  hours.   Radiology    No results found.  Cardiac Studies   TTE (08/29/2021):  1. Left ventricular ejection fraction, by estimation, is <20%. The left  ventricle has severely decreased function. The left ventricle demonstrates  global hypokinesis. There is mild concentric left ventricular hypertrophy.  Left ventricular diastolic  parameters are consistent with Grade III diastolic dysfunction  (restrictive).   2. Right ventricular systolic function is moderately reduced. The right  ventricular size is mildly enlarged.   3. Left atrial size was mildly dilated.   4. Right atrial size was moderately dilated.   5. The mitral valve is normal in structure. Mild mitral valve  regurgitation.   6. The aortic valve is normal in structure. Aortic valve regurgitation is  not visualized. No aortic stenosis is present.   7. The inferior  vena cava is dilated in size with <50% respiratory  variability, suggesting right atrial pressure of 15 mmHg.  Patient Profile     70 y.o. male with history of HFrEF of uncertain etiology and dates back to at least 01/2021, alcohol abuse with ascites and cirrhosis, CKD stage II-III, HTN, and HLD who we are seeing for acute on chronic HFrEF and elevated troponin.  Assessment & Plan    Acute on chronic HFrEF: Volume status improving though some edema and JVD persist.  Renal function improving with continued diuresis. -Continue furosemide infusion at 4 mg/h. -Continue metoprolol succinate 12.5 mg daily. -Continue to pegol flows and 10 mg daily. -Plan to add ARB in the next 1 to 2 days if blood pressure and renal function tolerate. -Consider adding spironolactone when renal function levels off. -Plan for right and left heart catheterization with possible PCI tomorrow.  Demand ischemia: No angina reported.  Suspect mild troponin elevation reflects supply-demand mismatch. [facility] tomorrow. -Continue ASA and statin pending catheterization.  Cirrhosis with ascites and hypoalbuminemia: -Encourage alcohol cessation. -Right heart catheterization tomorrow to assess PA pressures. -Anticipate adding spironolactone when renal function levels off.  Acute kidney injury superimposed on chronic kidney disease stage III: Renal function gradually improving with continued diuresis. -Continue diuresis with careful monitoring of renal function. -Avoid nephrotoxic drugs.  Shared Decision Making/Informed Consent The risks [stroke (1 in 1000), death (1 in 1000), kidney failure [usually temporary] (1 in 500), bleeding (1 in 200), allergic reaction [possibly serious] (1 in 200)], benefits (diagnostic support and management of coronary artery disease) and alternatives of a cardiac catheterization were discussed in detail with [name] and he is willing to proceed.    For questions or updates, please contact  [facility] Please consult [email or web address] for contact info under [facility].    Signed, Yvonne Kendall, MD  09/07/2021, 9:20 AM

## 2021-09-07 NOTE — Progress Notes (Signed)
Physical Therapy Treatment Patient Details Name: Tyler Ochoa MRN: [id number] DOB: 11/09/1950 Today's Date: 09/07/2021   History of Present Illness Pt is a 70 y/o M with PMH: HTN, HLD, HFrRF, and renal insufficiency who presented to the ED d/t wrosening SOB and weakness with concerns for fluid overload from HF clinic. Pt with h/o noncompliance including not f/u with cardiologist and not getting cardiac testing after last hospitalization in April 2022. Cardiology consult reports that increased troponin is 2/2 CHF.    PT Comments    Cardiology cleared pt for participation in tx via secure chat. Pt seen for PT tx with pt agreeable to session. Pt requires min assist for supine<>sit with PT assisting LLE on/off of bed. Pt is able to complete sit<>stand from EOB with mod assist with good ability to push to standing with BUE with cuing & good return demo of anterior weight shifting. During 2nd standing attempt pt is able to take ~2-3 steps to R along EOB with min<>mod assist with heavy reliance on BUE. Pt demonstrates decreased foot clearance & step length & is limited by increasing BLE knee pain. Pt reports "I gave you all I had". Will continue to follow pt acutely to progress mobility as able.     Recommendations for follow up therapy are one component of a multi-disciplinary discharge planning process, led by the attending physician.  Recommendations may be updated based on patient status, additional functional criteria and insurance authorization.  Follow Up Recommendations  Skilled nursing-short term rehab (<3 hours/day)     Assistance Recommended at Discharge Frequent or constant Supervision/Assistance  Equipment Recommendations   (TBD in next venue)    Recommendations for Other Services       Precautions / Restrictions Precautions Precautions: Fall Restrictions Weight Bearing Restrictions: No     Mobility  Bed Mobility Overal bed mobility: Needs Assistance Bed Mobility: Supine  to Sit;Sit to Supine     Supine to sit: Min assist;HOB elevated Sit to supine: Min assist;HOB elevated   General bed mobility comments: assistance to move LLE on/off of bed    Transfers Overall transfer level: Needs assistance Equipment used: Rolling walker (2 wheels) Transfers: Sit to/from Stand Sit to Stand: Mod assist           General transfer comment: Cuing for hand placement with pt transferring sit>stand x 2 with mod assist with good ability to push with BUE on EOB & cuing for increased anterior weight shift.    Ambulation/Gait                 Stairs             Wheelchair Mobility    Modified Rankin (Stroke Patients Only)       Balance Overall balance assessment: Needs assistance Sitting-balance support: Feet supported;No upper extremity supported Sitting balance-Leahy Scale: Good     Standing balance support: Bilateral upper extremity supported;During functional activity Standing balance-Leahy Scale:  (fair<>poor) Standing balance comment: CGA & BUE support on RW                            Cognition Arousal/Alertness: Awake/alert Behavior During Therapy: WFL for tasks assessed/performed;Flat affect Overall Cognitive Status: Within Functional Limits for tasks assessed                                 General Comments: Pt received sitting in  room with all lights off, requesting all lights off at end of session.        Exercises      General Comments General comments (skin integrity, edema, etc.): SPO2 >90% on room air, HR 107 bpm during session      Pertinent Vitals/Pain Pain Score: 8  Pain Location: 5/10 at rest, up to 8/10 with activity in BLE knees Pain Descriptors / Indicators: Aching;Sore Pain Intervention(s): Monitored during session;Limited activity within patient's tolerance;Repositioned    Home Living                          Prior Function            PT Goals (current goals can  now be found in the care plan section) Acute Rehab PT Goals Patient Stated Goal: decreased knee pain, go to rehab PT Goal Formulation: With patient/family Time For Goal Achievement: 09/13/21 Potential to Achieve Goals: Fair Progress towards PT goals: Progressing toward goals    Frequency    Min 2X/week      PT Plan Current plan remains appropriate    Co-evaluation              AM-PAC PT "6 Clicks" Mobility   Outcome Measure  Help needed turning from your back to your side while in a flat bed without using bedrails?: A Little Help needed moving from lying on your back to sitting on the side of a flat bed without using bedrails?: A Little Help needed moving to and from a bed to a chair (including a wheelchair)?: A Lot Help needed standing up from a chair using your arms (e.g., wheelchair or bedside chair)?: A Lot Help needed to walk in hospital room?: A Lot Help needed climbing 3-5 steps with a railing? : Total 6 Click Score: 13    End of Session Equipment Utilized During Treatment: Gait belt Activity Tolerance: Patient limited by pain Patient left: in bed;with call bell/phone within reach;with bed alarm set Nurse Communication: Mobility status PT Visit Diagnosis: Difficulty in walking, not elsewhere classified (R26.2);Pain;Muscle weakness (generalized) (M62.81) Pain - Right/Left:  (bilateral) Pain - part of body: Knee     Time: 1540-1603 PT Time Calculation (min) (ACUTE ONLY): 23 min  Charges:  $Therapeutic Activity: 23-37 mins                     Aleda Grana, PT, DPT 09/07/21, 4:10 PM    Sandi Mariscal 09/07/2021, 4:08 PM

## 2021-09-07 NOTE — Progress Notes (Signed)
PROGRESS NOTE  Tyler Ochoa [id number] DOB: [birth year]   PCP: Manson Allan, MD  Patient is from:.  [place] rolling walker and cane.  Lives with his girlfriend.  DOA: 08/28/2021 LOS: 10  Chief complaints:  Chief Complaint  Patient presents with   Weakness   Ascites     Brief Narrative / Interim history: 70 year old male with PMH of systolic CHF, chronic edema, osteoarthritis, debility, HTN and HLD presenting with generalized weakness, dry cough, DOE, BLE edema and orthopnea, and admitted with acute on chronic systolic CHF, possible non-STEMI and AKI.  BNP elevated to 3200.  Troponin elevated to 420 and improved to 327.  Started on IV Lasix and IV heparin.  Cardiology consulted.  TTE with LVEF <20% (25 to 30% in 01/2021), G3 DD and moderate RAE.  No further cardiac or pulmonary evaluation per cardiologist.  Therapy recommended SNF.  Subjective: No significant event overnight, patient was awake and alert, patient stated that he still has swelling in his feet which seems to be stable, not worsening.  Patient denied any chest palpitation, no any other active issues.    Objective: Vitals:   09/07/21 0032 09/07/21 0553 09/07/21 0724 09/07/21 1107  BP: 106/81 109/75 107/76 95/65  Pulse: 100 100 79 97  Resp: 18 (!) 24 20 20   Temp: 99.6 F (37.6 C) 98.9 F (37.2 C) 98.7 F (37.1 C) 98 F (36.7 C)  TempSrc:      SpO2: 98% 95% 95% 98%  Weight:  97.5 kg    Height:        Intake/Output Summary (Last 24 hours) at 09/07/2021 1508 Last data filed at 09/07/2021 1501 Gross per 24 hour  Intake 1777.7 ml  Output 3000 ml  Net -1222.3 ml   Filed Weights   09/05/21 0554 09/06/21 0436 09/07/21 0553  Weight: 100.7 kg 98.3 kg 97.5 kg    Examination:  GENERAL: No apparent distress.  Nontoxic. HEENT: MMM.  Vision and hearing grossly intact.  NECK: Supple.  No apparent JVD.  RESP: ctab save for mild rales at bases CVS:  RRR. Heart sounds normal.  ABD/GI/GU: BS+. Abd soft,  mildly distended due to ascites, nontender.  MSK/EXT:  Moves extremities. No apparent deformity.  2-3+ BLE edema around ankle and dorsum of feet, seems to be improving gradually SKIN: no apparent skin lesion or wound NEURO: Awake and alert. Oriented appropriately.  No apparent focal neuro deficit. PSYCH: Calm. Normal affect.   Procedures:  None  Microbiology summarized: [id number] and influenza PCR nonreactive.  Assessment & Plan: Acute on chronic combined CHF: TTE with LVEF of <20% (25 to 30% in 01/2021), G3 DD and moderate RAE.  Reportedly had generalized weakness, DOE, 3 pillow orthopnea and BLE edema on presentation although  patient denies significant acute change.  BNP 3200>> 4400.  CXR with stable cardiomegaly.  Elevated troponin likely from demand ischemia as well. -No further cardiac evaluation from cardiology standpoint, needs outpt f/u with dr. Humphrey Rolls -s/p IV Lasix 40 mg daily, it was decreased due to elevated creatinine.  10/25 started Lasix IV infusion for gradual continuous diuresis.   -GDM, pt was on Entresto which was held due to low bp,  continue Farxiga, decrease Toprol-XL from 50 to 12.5 mg p.o. daily due to low blood pressure -Monitor fluid status, renal functions and electrolytes. -low Sodium diet and fluid restriction 1.5 L per day. -Palliative medicine consulted for goal of care discussion. Patient was seen by cardiology Dr. Humphrey Rolls, patient is noncompliant with follow-ups, patient wants  to switch cardiologist, spoke to Dr. Welton Flakes and he is okay with that. Discussed with cardiologist [facility] who will follow patient and will schedule appointment as an outpatient as well.     Liver cirrhosis with ascites found on CT abdomen pelvis,  Unknown cause of liver cirrhosis could be due to congestion secondary to CHF. Continue diuretic as above  ammonia level wnl   Elevated troponin without significant delta: Likely demand ischemia.  No RWMA on TTE. -Continue statin, aspirin and  beta-blocker Cardiology following, plan for cardiac cath tomorrow a.m. Keep n.p.o. after midnight Follow cardiology for disposition plan, patient can be discharged in 1 to 2 days when cleared by cardiology   AKI/azotemia: Cardiorenal?  Base creatinine appears to be mid 1s, up to 1.46 today -Continue monitoring - diuresis as above  Hypokalemia, potassium repleted. Hypophosphatemia, phos repleted.  Elevated liver enzymes/hyperbilirubinemia/coagulopathy: CK within normal.  Denies EtOH.  Likely congestive hepatopathy as improving w/ diuresis. Appears to have hx of elevated bilirubin Recent Labs  Lab 09/03/21 0412 09/04/21 0353 09/05/21 0412 09/06/21 0424 09/07/21 0414  AST 53* 59* 70* 72* 88*  ALT 48* 42 43 40 41  ALKPHOS 118 125 118 125 141*  BILITOT 3.9* 3.4* 3.4* 3.2* 3.0*  PROT 6.1* 6.1* 6.2* 6.1* 6.5  ALBUMIN 2.6* 2.4* 2.4* 2.3* 2.5*  -Continue monitoring LFTs - RUQ u/s shows suspected liver cirrhosis, mild ascites and right renal mass, recommended CT or MRI CT scan abdomen pelvis shows liver cirrhosis, ascites could not rule out renal mass due to noncontrast study, recommended CT scan with contrast versus MRI with contrast.   Essential hypertension: Normotensive. -Cardiac meds as above  Hyperlipidemia:  -Continue statin.  Ambulatory dysfunction/knee osteoarthritis -Therapy recommended SNF, patient interested, TOC following, insurance authorization received.  BLE edema, due to chf, Liver cirrhosis and low albumin, third spacing. Dopplers neg for dvt Continue to wear TED stockings, and diuretics as above  Thrombocytopenia: Stable, resolved Recent Labs  Lab 09/02/21 0220 09/03/21 0412 09/04/21 0353 09/05/21 0412 09/06/21 0424 09/07/21 0414  PLT 153 160 159 153 153 136*  -Continue monitoring  Right renal mass, patient was recommended to follow with PCP to repeat CT abdomen pelvis with contrast when renal functions improved versus MRI as an outpatient and  follow-up with urology as an outpatient  PT and OT eval done, recommend SNF placement.  Discussed with case Production designer, theatre/television/film and social worker, Firefighter received today.  Patient can be discharged to SNF when cleared by cardiology.   Goal of care counseling: Patient with advanced CHF and other comorbidity as above.  Poor long-term prognosis.  Hospice candidate.  However, patient still full code and prefers to remain full code despite risk and benefit discussion.  -Palliative medicine consulted.  Body mass index is 28.02 kg/m.   Pressure Injury 08/29/21 Buttocks Medial Stage 2 -  Partial thickness loss of dermis presenting as a shallow open injury with a red, pink wound bed without slough. (Active)  08/29/21 2109  Location: Buttocks  Location Orientation: Medial  Staging: Stage 2 -  Partial thickness loss of dermis presenting as a shallow open injury with a red, pink wound bed without slough.  Wound Description (Comments):   Present on Admission: Yes   DVT prophylaxis:  Place TED hose Start: 09/01/21 1055 heparin injection 5,000 Units Start: 08/29/21 2200  Code Status: Full code Family Communication: Patient and/or RN. Available if any question.  Level of care: Progressive Cardiac Status is: Inpatient  Remains inpatient appropriate because: Safe  disposition/SNF in 1-2 days     Consultants:  Cardiology   Sch Meds:  Scheduled Meds:  aspirin EC  81 mg Oral Daily   atorvastatin  80 mg Oral QHS   dapagliflozin propanediol  10 mg Oral QAC breakfast   feeding supplement  237 mL Oral TID BM   heparin injection (subcutaneous)  5,000 Units Subcutaneous Q8H   metoprolol succinate  12.5 mg Oral Q breakfast   potassium chloride SA  20 mEq Oral Daily   Continuous Infusions:  furosemide (LASIX) 200 mg in dextrose 5% 100 mL (2mg /mL) infusion 4 mg/hr (09/07/21 1505)    PRN Meds:.acetaminophen **OR** acetaminophen, albuterol, guaiFENesin-dextromethorphan, magnesium hydroxide, ondansetron  **OR** ondansetron (ZOFRAN) IV, traZODone  Antimicrobials: Anti-infectives (From admission, onward)    None        I have personally reviewed the following labs and images: CBC: Recent Labs  Lab 09/03/21 0412 09/04/21 0353 09/05/21 0412 09/06/21 0424 09/07/21 0414  WBC 4.1 3.9* 4.0 3.8* 4.5  HGB 15.2 14.6 14.4 14.2 15.5  HCT 43.4 39.8 39.7 39.7 42.8  MCV 83.1 82.6 82.9 82.5 83.4  PLT 160 159 153 153 136*   BMP &GFR Recent Labs  Lab 09/03/21 0412 09/04/21 0353 09/05/21 0412 09/06/21 0424 09/07/21 0414  NA 135 133* 132* 133* 132*  K 3.7 3.3* 3.4* 3.6 4.0  CL 108 104 104 100 98  CO2 21* 21* 24 24 25   GLUCOSE 111* 105* 111* 109* 134*  BUN 47* 44* 41* 41* 42*  CREATININE 1.63* 1.55* 1.51* 1.59* 1.46*  CALCIUM 8.0* 8.0* 8.1* 7.9* 8.2*  MG 2.1 2.1 1.9 1.9 1.9  PHOS 2.5 2.2* 2.6 2.2* 2.7   Estimated Creatinine Clearance: 56.3 mL/min (A) (by C-G formula based on SCr of 1.46 mg/dL (H)). Liver & Pancreas: Recent Labs  Lab 09/03/21 0412 09/04/21 0353 09/05/21 0412 09/06/21 0424 09/07/21 0414  AST 53* 59* 70* 72* 88*  ALT 48* 42 43 40 41  ALKPHOS 118 125 118 125 141*  BILITOT 3.9* 3.4* 3.4* 3.2* 3.0*  PROT 6.1* 6.1* 6.2* 6.1* 6.5  ALBUMIN 2.6* 2.4* 2.4* 2.3* 2.5*   No results for input(s): LIPASE, AMYLASE in the last 168 hours. Recent Labs  Lab 09/02/21 1527 09/03/21 0412  AMMONIA 30 22   Diabetic: No results for input(s): HGBA1C in the last 72 hours. No results for input(s): GLUCAP in the last 168 hours. Cardiac Enzymes: No results for input(s): CKTOTAL, CKMB, CKMBINDEX, TROPONINI in the last 168 hours.  No results for input(s): PROBNP in the last 8760 hours. Coagulation Profile: No results for input(s): INR, PROTIME in the last 168 hours.  Thyroid Function Tests: No results for input(s): TSH, T4TOTAL, FREET4, T3FREE, THYROIDAB in the last 72 hours. Lipid Profile: No results for input(s): CHOL, HDL, LDLCALC, TRIG, CHOLHDL, LDLDIRECT in the last 72  hours. Anemia Panel: No results for input(s): VITAMINB12, FOLATE, FERRITIN, TIBC, IRON, RETICCTPCT in the last 72 hours. Urine analysis:    Component Value Date/Time   COLORURINE YELLOW (A) 03/07/2021 1705   APPEARANCEUR CLEAR (A) 03/07/2021 1705   LABSPEC 1.010 03/07/2021 1705   PHURINE 5.0 03/07/2021 1705   GLUCOSEU >=500 (A) 03/07/2021 1705   HGBUR SMALL (A) 03/07/2021 1705   BILIRUBINUR NEGATIVE 03/07/2021 [place] 03/07/2021 1705   PROTEINUR 30 (A) 03/07/2021 1705   NITRITE NEGATIVE 03/07/2021 1705   LEUKOCYTESUR NEGATIVE 03/07/2021 1705   Sepsis Labs: Invalid input(s): PROCALCITONIN, [place]  Microbiology: Recent Results (from the past 240 hour(s))  Resp Panel by RT-PCR (Flu A&B, Covid) Nasopharyngeal Swab     Status: None   Collection Time: 08/28/21  9:43 PM   Specimen: Nasopharyngeal Swab; Nasopharyngeal(NP) swabs in vial transport medium  Result Value Ref Range Status   SARS Coronavirus 2 by RT PCR NEGATIVE NEGATIVE Final    Comment: (NOTE) SARS-CoV-2 target nucleic acids are NOT DETECTED.  The SARS-CoV-2 RNA is generally detectable in upper respiratory specimens during the acute phase of infection. The lowest concentration of SARS-CoV-2 viral copies this assay can detect is 138 copies/mL. A negative result does not preclude SARS-Cov-2 infection and should not be used as the sole basis for treatment or other patient management decisions. A negative result may occur with  improper specimen collection/handling, submission of specimen other than nasopharyngeal swab, presence of viral mutation(s) within the areas targeted by this assay, and inadequate number of viral copies(<138 copies/mL). A negative result must be combined with clinical observations, patient history, and epidemiological information. The expected result is Negative.  Fact Sheet for Patients:  EntrepreneurPulse.com.au  Fact Sheet for Healthcare Providers:   IncredibleEmployment.be  This test is no t yet approved or cleared by the [place] FDA and  has been authorized for detection and/or diagnosis of SARS-CoV-2 by FDA under an Emergency Use Authorization (EUA). This EUA will remain  in effect (meaning this test can be used) for the duration of the COVID-19 declaration under Section 564(b)(1) of the Act, 21 U.S.C.section 360bbb-3(b)(1), unless the authorization is terminated  or revoked sooner.       Influenza A by PCR NEGATIVE NEGATIVE Final   Influenza B by PCR NEGATIVE NEGATIVE Final    Comment: (NOTE) The Xpert Xpress SARS-CoV-2/FLU/RSV plus assay is intended as an aid in the diagnosis of influenza from Nasopharyngeal swab specimens and should not be used as a sole basis for treatment. Nasal washings and aspirates are unacceptable for Xpert Xpress SARS-CoV-2/FLU/RSV testing.  Fact Sheet for Patients: EntrepreneurPulse.com.au  Fact Sheet for Healthcare Providers: IncredibleEmployment.be  This test is not yet approved or cleared by the [place] FDA and has been authorized for detection and/or diagnosis of SARS-CoV-2 by FDA under an Emergency Use Authorization (EUA). This EUA will remain in effect (meaning this test can be used) for the duration of the COVID-19 declaration under Section 564(b)(1) of the Act, 21 U.S.C. section 360bbb-3(b)(1), unless the authorization is terminated or revoked.  Performed at [facility], [place], [place] [address]   SARS CORONAVIRUS 2 (TAT 6-24 HRS) Nasopharyngeal Nasopharyngeal Swab     Status: None   Collection Time: 09/06/21 12:00 PM   Specimen: Nasopharyngeal Swab  Result Value Ref Range Status   SARS Coronavirus 2 NEGATIVE NEGATIVE Final    Comment: (NOTE) SARS-CoV-2 target nucleic acids are NOT DETECTED.  The SARS-CoV-2 RNA is generally detectable in upper and lower respiratory specimens during  the acute phase of infection. Negative results do not preclude SARS-CoV-2 infection, do not rule out co-infections with other pathogens, and should not be used as the sole basis for treatment or other patient management decisions. Negative results must be combined with clinical observations, patient history, and epidemiological information. The expected result is Negative.  Fact Sheet for Patients: SugarRoll.be  Fact Sheet for Healthcare Providers: https://www.woods-mathews.com/  This test is not yet approved or cleared by the [place] FDA and  has been authorized for detection and/or diagnosis of SARS-CoV-2 by FDA under an Emergency Use Authorization (EUA). This EUA will remain  in effect (  meaning this test can be used) for the duration of the COVID-19 declaration under Se ction 564(b)(1) of the Act, 21 U.S.C. section 360bbb-3(b)(1), unless the authorization is terminated or revoked sooner.  Performed at [facility], [place] [address] [place], [place] [address]     Radiology Studies: No results found.    Val Riles, MD [facility]  If 7PM-7AM, please contact night-coverage [email or web address] 09/07/2021, 3:08 PM

## 2021-09-07 NOTE — Progress Notes (Addendum)
Occupational Therapy Treatment Patient Details Name: Tyler Ochoa MRN: [id number] DOB: Jun 29, 1951 Today's Date: 09/07/2021   History of present illness Pt is a 70 y/o M with PMH: HTN, HLD, HFrRF, and renal insufficiency who presented to the ED d/t wrosening SOB and weakness with concerns for fluid overload from HF clinic. Pt with h/o noncompliance including not f/u with cardiologist and not getting cardiac testing after last hospitalization in April 2022. Cardiology consult reports that increased troponin is 2/2 CHF.   OT comments  Pt. sitting in the room, in the dark upon arrival.  Pt. education was provided about energy conservation, work simplification techniques for LE ADLs, and A/E use. Pt. education was provided about A/E for LE ADLs. Nursing in during the session to assess catheter. Pt. was provided with a visual handout out for energy conservation, and work simplification techniques. Pt. Continues to benefit from OT services for ADL training, A/E training, and pt. education about energy conservation, work simplification, home modification, and DME. Pt. Continues to be appropriate for SNF level of care upon discharge, with follow-up OT services.    Recommendations for follow up therapy are one component of a multi-disciplinary discharge planning process, led by the attending physician.  Recommendations may be updated based on patient status, additional functional criteria and insurance authorization.    Follow Up Recommendations  Skilled nursing-short term rehab (<3 hours/day)    Assistance Recommended at Discharge    Equipment Recommendations  BSC;Tub/shower seat;Other (comment)    Recommendations for Other Services      Precautions / Restrictions Precautions Precautions: Fall Restrictions Weight Bearing Restrictions: No       Mobility Bed Mobility     Transfers   Pt. declined OOB activity secondary to catheter issues following PT.              Balance                             ADL either performed or assessed with clinical judgement   ADL                                         General ADL Comments: Pt. education was provided about A?E use for LE ADLs, and energy conservation/work simplification     Vision Patient Visual Report: No change from baseline     Perception     Praxis      Cognition Arousal/Alertness: Awake/alert Behavior During Therapy: WFL for tasks assessed/performed;Flat affect Overall Cognitive Status: Within Functional Limits for tasks assessed                                 General Comments: Pt received sitting in room with all lights off, requesting all lights off at end of session.          Exercises     Shoulder Instructions       General Comments     Pertinent Vitals/ Pain       Pain Assessment: No/denies pain Pain Score: 8  Pain Location: 5/10 at rest, up to 8/10 with activity in BLE knees Pain Descriptors / Indicators: Aching;Sore Pain Intervention(s): Monitored during session;Limited activity within patient's tolerance;Repositioned  Home Living  Prior Functioning/Environment              Frequency  Min 1X/week        Progress Toward Goals  OT Goals(current goals can now be found in the care plan section)  Progress towards OT goals: Progressing toward goals  Acute Rehab OT Goals OT Goal Formulation: With patient Potential to Achieve Goals: Good  Plan Discharge plan remains appropriate    Co-evaluation                 AM-PAC OT "6 Clicks" Daily Activity     Outcome Measure   Help from another person eating meals?: None Help from another person taking care of personal grooming?: None Help from another person toileting, which includes using toliet, bedpan, or urinal?: A Lot Help from another person bathing (including washing, rinsing, drying)?: A Lot Help from  another person to put on and taking off regular upper body clothing?: A Little Help from another person to put on and taking off regular lower body clothing?: A Lot 6 Click Score: 17    End of Session Equipment Utilized During Treatment: Gait belt;Rolling walker (2 wheels)  OT Visit Diagnosis: Unsteadiness on feet (R26.81);Muscle weakness (generalized) (M62.81);Pain Pain - part of body: Knee   Activity Tolerance Patient tolerated treatment well   Patient Left Other (comment)   Nurse Communication Mobility status        Time: 1620-1700 OT Time Calculation (min): 40 min  Charges: OT General Charges $OT Visit: 1 Visit OT Treatments $Self Care/Home Management : 8-22 mins   Olegario Messier, MS, OTR/L   Olegario Messier 09/07/2021, 5:10 PM

## 2021-09-07 NOTE — Care Management Important Message (Signed)
Important Message  Patient Details  Name: Tyler Ochoa MRN: [id number] Date of Birth: January 22, 1951   Medicare Important Message Given:  Yes     [name] 09/07/2021, 3:23 PM

## 2021-09-08 ENCOUNTER — Encounter
Admission: EM | Disposition: A | Discharge status: Skilled Nursing Facility | Payer: Self-pay | Source: Home / Self Care | Attending: Student

## 2021-09-08 ENCOUNTER — Ambulatory Visit: Payer: Medicare HMO | Admitting: Family

## 2021-09-08 DIAGNOSIS — I2510 Atherosclerotic heart disease of native coronary artery without angina pectoris: Secondary | ICD-10-CM | POA: Diagnosis not present

## 2021-09-08 DIAGNOSIS — I248 Other forms of acute ischemic heart disease: Secondary | ICD-10-CM | POA: Diagnosis not present

## 2021-09-08 DIAGNOSIS — I5023 Acute on chronic systolic (congestive) heart failure: Secondary | ICD-10-CM | POA: Diagnosis not present

## 2021-09-08 DIAGNOSIS — N179 Acute kidney failure, unspecified: Secondary | ICD-10-CM | POA: Diagnosis not present

## 2021-09-08 HISTORY — PX: RIGHT/LEFT HEART CATH AND CORONARY ANGIOGRAPHY: CATH118266

## 2021-09-08 LAB — COMPREHENSIVE METABOLIC PANEL
ALT: 43 U/L (ref 0–44)
AST: 121 U/L — ABNORMAL HIGH (ref 15–41)
Albumin: 2.1 g/dL — ABNORMAL LOW (ref 3.5–5.0)
Alkaline Phosphatase: 153 U/L — ABNORMAL HIGH (ref 38–126)
Anion gap: 9 (ref 5–15)
BUN: 41 mg/dL — ABNORMAL HIGH (ref 8–23)
CO2: 26 mmol/L (ref 22–32)
Calcium: 8.1 mg/dL — ABNORMAL LOW (ref 8.9–10.3)
Chloride: 96 mmol/L — ABNORMAL LOW (ref 98–111)
Creatinine, Ser: 1.24 mg/dL (ref 0.61–1.24)
GFR, Estimated: >60 mL/min (ref >60)
Glucose, Bld: 124 mg/dL — ABNORMAL HIGH (ref 70–99)
Potassium: 3.9 mmol/L (ref 3.5–5.1)
Sodium: 131 mmol/L — ABNORMAL LOW (ref 135–145)
Total Bilirubin: 2.8 mg/dL — ABNORMAL HIGH (ref 0.3–1.2)
Total Protein: 6.1 g/dL — ABNORMAL LOW (ref 6.5–8.1)

## 2021-09-08 LAB — CBC
HCT: 45.8 % (ref 39.0–52.0)
Hemoglobin: 16.5 g/dL (ref 13.0–17.0)
MCH: 30.3 pg (ref 26.0–34.0)
MCHC: 36 g/dL (ref 30.0–36.0)
MCV: 84 fL (ref 80.0–100.0)
Platelets: 108 10*3/uL — ABNORMAL LOW (ref 150–400)
RBC: 5.45 MIL/uL (ref 4.22–5.81)
RDW: 17.3 % — ABNORMAL HIGH (ref 11.5–15.5)
WBC: 4.4 10*3/uL (ref 4.0–10.5)
nRBC: 0 % (ref 0.0–0.2)

## 2021-09-08 LAB — PROTIME-INR
INR: 1.4 — ABNORMAL HIGH (ref 0.8–1.2)
Prothrombin Time: 17.5 seconds — ABNORMAL HIGH (ref 11.4–15.2)

## 2021-09-08 LAB — GLUCOSE, CAPILLARY: Glucose-Capillary: 160 mg/dL — ABNORMAL HIGH (ref 70–99)

## 2021-09-08 LAB — TSH: TSH: 2.864 u[IU]/mL (ref 0.350–4.500)

## 2021-09-08 LAB — FERRITIN: Ferritin: 368 ng/mL — ABNORMAL HIGH (ref 24–336)

## 2021-09-08 SURGERY — RIGHT/LEFT HEART CATH AND CORONARY ANGIOGRAPHY
Anesthesia: Moderate Sedation

## 2021-09-08 MED ORDER — LIDOCAINE HCL (PF) 1 % IJ SOLN
INTRAMUSCULAR | Status: DC | PRN
  Administered 2021-09-08: 2 mL
  Administered 2021-09-08: 5 mL

## 2021-09-08 MED ORDER — SODIUM CHLORIDE 0.9 % IV SOLN: 250.0000 mL | INTRAVENOUS | Status: DC | PRN

## 2021-09-08 MED ORDER — SODIUM CHLORIDE 0.9% FLUSH
3.0000 mL | INTRAVENOUS | Status: DC | PRN
Start: 2021-09-08 — End: 2021-09-08

## 2021-09-08 MED ORDER — HEPARIN SODIUM (PORCINE) 1000 UNIT/ML IJ SOLN
INTRAMUSCULAR | Status: AC
  Filled 2021-09-08: qty 1

## 2021-09-08 MED ORDER — SODIUM CHLORIDE 0.9% FLUSH: 3.0000 mL | Freq: Two times a day (BID) | INTRAVENOUS | Status: DC

## 2021-09-08 MED ORDER — HEPARIN (PORCINE) IN NACL 1000-0.9 UT/500ML-% IV SOLN
INTRAVENOUS | Status: AC
  Filled 2021-09-08: qty 1000

## 2021-09-08 MED ORDER — HEPARIN (PORCINE) IN NACL 1000-0.9 UT/500ML-% IV SOLN
INTRAVENOUS | Status: DC | PRN
  Administered 2021-09-08: 1000 mL

## 2021-09-08 MED ORDER — LIDOCAINE HCL 1 % IJ SOLN
INTRAMUSCULAR | Status: AC
  Filled 2021-09-08: qty 20

## 2021-09-08 MED ORDER — VERAPAMIL HCL 2.5 MG/ML IV SOLN
INTRAVENOUS | Status: DC | PRN
  Administered 2021-09-08: 2.5 mg via INTRA_ARTERIAL

## 2021-09-08 MED ORDER — SODIUM CHLORIDE 0.9% FLUSH
3.0000 mL | Freq: Two times a day (BID) | INTRAVENOUS | Status: DC
  Administered 2021-09-08 – 2021-09-09 (×2): 3 mL via INTRAVENOUS

## 2021-09-08 MED ORDER — SODIUM CHLORIDE 0.9 % IV SOLN: INTRAVENOUS | Status: DC

## 2021-09-08 MED ORDER — SODIUM CHLORIDE 0.9% FLUSH: 3.0000 mL | INTRAVENOUS | Status: DC | PRN

## 2021-09-08 MED ORDER — ENOXAPARIN SODIUM 40 MG/0.4ML IJ SOSY
40.0000 mg | PREFILLED_SYRINGE | INTRAMUSCULAR | Status: DC
  Administered 2021-09-09: 40 mg via SUBCUTANEOUS
  Filled 2021-09-08: qty 0.4

## 2021-09-08 MED ORDER — IOHEXOL 350 MG/ML SOLN
INTRAVENOUS | Status: DC | PRN
  Administered 2021-09-08: 26 mL

## 2021-09-08 MED ORDER — FENTANYL CITRATE (PF) 100 MCG/2ML IJ SOLN
INTRAMUSCULAR | Status: AC
  Filled 2021-09-08: qty 2

## 2021-09-08 MED ORDER — MIDAZOLAM HCL 2 MG/2ML IJ SOLN
INTRAMUSCULAR | Status: AC
  Filled 2021-09-08: qty 2

## 2021-09-08 MED ORDER — VERAPAMIL HCL 2.5 MG/ML IV SOLN
INTRAVENOUS | Status: AC
  Filled 2021-09-08: qty 2

## 2021-09-08 MED ORDER — HEPARIN SODIUM (PORCINE) 1000 UNIT/ML IJ SOLN
INTRAMUSCULAR | Status: DC | PRN
  Administered 2021-09-08: 5000 [IU] via INTRAVENOUS

## 2021-09-08 MED ORDER — ASPIRIN 81 MG PO CHEW: 81.0000 mg | CHEWABLE_TABLET | ORAL | Status: DC

## 2021-09-08 MED ORDER — HYDRALAZINE HCL 20 MG/ML IJ SOLN: 10.0000 mg | INTRAMUSCULAR | Status: AC | PRN

## 2021-09-08 SURGICAL SUPPLY — 13 items
CATH 5F 110X4 TIG (CATHETERS) ×1 IMPLANT
CATH BALLN WEDGE 5F 110CM (CATHETERS) ×1 IMPLANT
CATH INFINITI 5F MPA2 125 (CATHETERS) ×1 IMPLANT
CATH INFINITI 5FR JR4 125CM (CATHETERS) ×1 IMPLANT
DEVICE RAD TR BAND REGULAR (VASCULAR PRODUCTS) ×1 IMPLANT
DRAPE BRACHIAL (DRAPES) ×1 IMPLANT
GLIDESHEATH SLEND SS 6F .021 (SHEATH) ×2 IMPLANT
KIT SYRINGE INJ CVI SPIKEX1 (MISCELLANEOUS) ×1 IMPLANT
PACK CARDIAC CATH (CUSTOM PROCEDURE TRAY) ×2 IMPLANT
PROTECTION STATION PRESSURIZED (MISCELLANEOUS) ×2
SET ATX SIMPLICITY (MISCELLANEOUS) ×1 IMPLANT
STATION PROTECTION PRESSURIZED (MISCELLANEOUS) IMPLANT
WIRE HI TORQ VERSACORE J 260CM (WIRE) ×1 IMPLANT

## 2021-09-08 NOTE — Brief Op Note (Signed)
BRIEF CARDIAC CATHETERIZATION NOTE  DATE: 09/08/2021  TIME: 10:08 AM  PATIENT:  Tyler Ochoa  70 y.o. 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  There is mild-moderate LCx and RCA disease. Upper normal to mildly elevated left and right heart filling pressures. Mild-moderate pulmonary hypertension.  RECOMMENDATIONS: Discontinue furosemide infusion; anticipate transitioning to oral diuretics tomorrow. Escalate GDMT as tolerated. Consider outpatient viability study to determine if LAD revascularization may be beneficial. Aggressive secondary prevention of coronary artery disease.  Yvonne Kendall, MD [facility]

## 2021-09-08 NOTE — Progress Notes (Signed)
PROGRESS NOTE    Tyler Ochoa  [id number] DOB: [birth year] DOA: 08/28/2021 PCP: Manson Allan, MD   Brief Narrative: Taken from prior notes. 70 year old male with PMH of systolic CHF, chronic edema, osteoarthritis, debility, HTN and HLD presenting with generalized weakness, dry cough, DOE, BLE edema and orthopnea, and admitted with acute on chronic systolic CHF, possible non-STEMI and AKI.  BNP elevated to 3200.  Troponin elevated to 420 and improved to 327.  Initially started on IV Lasix and heparin infusion, IV Lasix later transitioned to Lasix infusion due to worsening renal function.  TTE with LVEF <20% (25 to 30% in 01/2021), G3 DD and moderate RAE.  His cardiologist, Dr. Humphrey Rolls was consulted and initially there was no plan for further work-up.  Later patient asked to switch cardiologist and [facility] was consulted.  Patient was taken to Cath Lab for right and left cardiac catheterization today, on 09/08/2021 which showed significant multivessel CAD including chronic total occlusion of the mid LAD that is well collateralized as well as moderate disease of up to 70% involving the LCx and RCA.  Cardiology is recommending optimizing the treatment as tolerated.  This stopped the Lasix infusion and he will be transitioned to p.o. Lasix from tomorrow.  CT abdomen and pelvis concerning of liver cirrhosis with ascites.  Child pugh class B and meld score of 30 which makes next 90-day mortality at 27 to 32%.  Ammonia levels within normal limit.  Patient is high risk for deterioration and death based on his underlying comorbidities and history of being noncompliant.  Palliative care was consulted and this will be an ongoing discussion.  Currently will remain at full code.  Subjective: Patient was seen and examined after cardiac catheterization today.  He feels little irritated.  Denies any chest pain or shortness of breath.  Girlfriend at bedside.  Assessment & Plan:   Active Problems:   NSTEMI  (non-ST elevated myocardial infarction) (HCC)   Pressure injury of skin   HFrEF (heart failure with reduced ejection fraction) (HCC)   Liver function abnormality   Hypervolemia   Acute HFrEF (heart failure with reduced ejection fraction) (HCC)   Acute renal failure (HCC)  Acute on chronic HFrEF.  Repeat EF of less than 20%, it was 25 to 30% according to echo done in March 2022.  Elevated BNP.  Underwent cardiac catheterization today with [place] [facility] with EF of less than 20%.  Multivessel disease.  They are recommending optimizing the medical management as his blood pressure allows.  Appears euvolemic.  Lasix infusion discontinued by cardiology today. -Continue with low-dose Toprol and Farxiga. -Cardiology is deciding to start him on p.o. diuretic from tomorrow. -Continue to hold Entresto due to softer blood pressure. -Continue with daily weight and BMP -Strict intake and output  Elevated troponin.  With a flat and later downward trending curve, most likely secondary to demand ischemia.  Cardiac catheterization with multivessel disease.  No chest pain. -Continue with low-dose beta-blocker, aspirin and statin  Liver cirrhosis with ascites.  Found on CT abdomen pelvis.  Ammonia levels within normal limit.  Patient denies alcohol use.  Elevated alkaline phosphatase and T bili with hypoalbuminemia.  Child Pugh class B with MELD-sodium score of 30.  Which makes 90-day mortality at 27 to 32% -Continue with diuretics as mentioned above for CHF. -Will need outpatient GI follow-up.  AKI/azotemia.  Some improvement in creatinine.  Cardiorenal versus hepatorenal. -Monitor renal function. -Avoid nephrotoxins  Right renal mass.  Found on CT abdomen.  They are recommending repeat CT scan with contrast versus MRI with contrast. -Patient need to follow-up with urology as an outpatient for further recommendations.  Physical deconditioning.  PT is recommending rehab.  Pressure injuries.  Pressure Injury  08/29/21 Buttocks Medial Stage 2 -  Partial thickness loss of dermis presenting as a shallow open injury with a red, pink wound bed without slough. (Active)  08/29/21 2109  Location: Buttocks  Location Orientation: Medial  Staging: Stage 2 -  Partial thickness loss of dermis presenting as a shallow open injury with a red, pink wound bed without slough.  Wound Description (Comments):   Present on Admission: Yes    Objective: Vitals:   09/08/21 1145 09/08/21 1200 09/08/21 1215 09/08/21 1230  BP:  93/67 (!) 89/63 (!) 86/53  Pulse: (!) 127 (!) 122 (!) 125 (!) 124  Resp: 20 (!) 27    Temp:      TempSrc:      SpO2: 94% 95% 100% 95%  Weight:      Height:        Intake/Output Summary (Last 24 hours) at 09/08/2021 1439 Last data filed at 09/08/2021 0800 Gross per 24 hour  Intake 736.03 ml  Output 2750 ml  Net -2013.97 ml   Filed Weights   09/06/21 0436 09/07/21 0553 09/08/21 0500  Weight: 98.3 kg 97.5 kg 98.2 kg    Examination:  General exam: Appears calm and comfortable  Respiratory system: Clear to auscultation. Respiratory effort normal. Cardiovascular system: S1 & S2 heard, RRR.  Gastrointestinal system: Soft, nontender, nondistended, bowel sounds positive. Central nervous system: Alert and oriented. No focal neurological deficits. Extremities: No edema, no cyanosis, pulses intact and symmetrical. Psychiatry: Judgement and insight appear normal.   DVT prophylaxis: Lovenox Code Status: Full Family Communication: Discussed with girlfriend at bedside Disposition Plan:  Status is: Inpatient  Remains inpatient appropriate because: Of the severity of illness   Level of care: Progressive Cardiac  All the records are reviewed and case discussed with Care Management/Social Worker. Management plans discussed with the patient, nursing and they are in agreement.  Consultants:  Cardiology  Procedures:  Right and left cardiac catheterization  Antimicrobials:   Data  Reviewed: I have personally reviewed following labs and imaging studies  CBC: Recent Labs  Lab 09/04/21 0353 09/05/21 0412 09/06/21 0424 09/07/21 0414 09/08/21 0443  WBC 3.9* 4.0 3.8* 4.5 4.4  HGB 14.6 14.4 14.2 15.5 16.5  HCT 39.8 39.7 39.7 42.8 45.8  MCV 82.6 82.9 82.5 83.4 84.0  PLT 159 153 153 136* 123XX123*   Basic Metabolic Panel: Recent Labs  Lab 09/03/21 0412 09/04/21 0353 09/05/21 0412 09/06/21 0424 09/07/21 0414 09/08/21 0443  NA 135 133* 132* 133* 132* 131*  K 3.7 3.3* 3.4* 3.6 4.0 3.9  CL 108 104 104 100 98 96*  CO2 21* 21* 24 24 25 26   GLUCOSE 111* 105* 111* 109* 134* 124*  BUN 47* 44* 41* 41* 42* 41*  CREATININE 1.63* 1.55* 1.51* 1.59* 1.46* 1.24  CALCIUM 8.0* 8.0* 8.1* 7.9* 8.2* 8.1*  MG 2.1 2.1 1.9 1.9 1.9  --   PHOS 2.5 2.2* 2.6 2.2* 2.7  --    GFR: Estimated Creatinine Clearance: 66.3 mL/min (by C-G formula based on SCr of 1.24 mg/dL). Liver Function Tests: Recent Labs  Lab 09/04/21 0353 09/05/21 0412 09/06/21 0424 09/07/21 0414 09/08/21 0443  AST 59* 70* 72* 88* 121*  ALT 42 43 40 41 43  ALKPHOS 125 118 125 141* 153*  BILITOT 3.4*  3.4* 3.2* 3.0* 2.8*  PROT 6.1* 6.2* 6.1* 6.5 6.1*  ALBUMIN 2.4* 2.4* 2.3* 2.5* 2.1*   No results for input(s): LIPASE, AMYLASE in the last 168 hours. Recent Labs  Lab 09/02/21 1527 09/03/21 0412  AMMONIA 30 22   Coagulation Profile: Recent Labs  Lab 09/08/21 1224  INR 1.4*   Cardiac Enzymes: No results for input(s): CKTOTAL, CKMB, CKMBINDEX, TROPONINI in the last 168 hours. BNP (last 3 results) No results for input(s): PROBNP in the last 8760 hours. HbA1C: No results for input(s): HGBA1C in the last 72 hours. CBG: No results for input(s): GLUCAP in the last 168 hours. Lipid Profile: No results for input(s): CHOL, HDL, LDLCALC, TRIG, CHOLHDL, LDLDIRECT in the last 72 hours. Thyroid Function Tests: Recent Labs    09/08/21 0443  TSH 2.864   Anemia Panel: Recent Labs    09/08/21 0443  FERRITIN  368*   Sepsis Labs: No results for input(s): PROCALCITON, LATICACIDVEN in the last 168 hours.  Recent Results (from the past 240 hour(s))  SARS CORONAVIRUS 2 (TAT 6-24 HRS) Nasopharyngeal Nasopharyngeal Swab     Status: None   Collection Time: 09/06/21 12:00 PM   Specimen: Nasopharyngeal Swab  Result Value Ref Range Status   SARS Coronavirus 2 NEGATIVE NEGATIVE Final    Comment: (NOTE) SARS-CoV-2 target nucleic acids are NOT DETECTED.  The SARS-CoV-2 RNA is generally detectable in upper and lower respiratory specimens during the acute phase of infection. Negative results do not preclude SARS-CoV-2 infection, do not rule out co-infections with other pathogens, and should not be used as the sole basis for treatment or other patient management decisions. Negative results must be combined with clinical observations, patient history, and epidemiological information. The expected result is Negative.  Fact Sheet for Patients: HairSlick.no  Fact Sheet for Healthcare Providers: quierodirigir.com  This test is not yet approved or cleared by the [place] FDA and  has been authorized for detection and/or diagnosis of SARS-CoV-2 by FDA under an Emergency Use Authorization (EUA). This EUA will remain  in effect (meaning this test can be used) for the duration of the COVID-19 declaration under Se ction 564(b)(1) of the Act, 21 U.S.C. section 360bbb-3(b)(1), unless the authorization is terminated or revoked sooner.  Performed at [facility], [address] [place], [place] [address]      Radiology Studies: CARDIAC CATHETERIZATION  Result Date: 09/08/2021 Conclusions: Multivessel coronary artery disease, as detailed below, including chronic total occlusion of the mid LAD, as well as moderate LCx and RCA disease of up to 70%. Upper normal to mildly elevated left and right heart filling pressures. Mild to moderate pulmonary  hypertension. Mildly reduced Fick cardiac output/index. Recommendations: Discontinue furosemide infusion; anticipate transitioning to oral diuretic regimen tomorrow. Escalate goal-directed medical therapy as tolerated. Once optimized from heart failure standpoint, consider viability study to determine if coronary revascularization may be beneficial. Secondary prevention of coronary artery disease. Yvonne Kendall, MD [facility]   Scheduled Meds:  aspirin EC  81 mg Oral Daily   atorvastatin  80 mg Oral QHS   dapagliflozin propanediol  10 mg Oral QAC breakfast   [START ON 09/09/2021] enoxaparin (LOVENOX) injection  40 mg Subcutaneous Q24H   feeding supplement  237 mL Oral TID BM   metoprolol succinate  12.5 mg Oral Q breakfast   sodium chloride flush  3 mL Intravenous Q12H   Continuous Infusions:  sodium chloride       LOS: 11 days   Time spent: 45  minutes. More than 50% of the time was spent in counseling/coordination of care  Lorella Nimrod, MD [facility]  If 7PM-7AM, please contact night-coverage [email or web address]  09/08/2021, 2:39 PM   This record has been created using Systems analyst. Errors have been sought and corrected,but may not always be located. Such creation errors do not reflect on the standard of care.

## 2021-09-08 NOTE — Progress Notes (Signed)
Patient with heart of 140. Dr. Okey Dupre notified. Dr. Okey Dupre to see patient. New orders received for EKG, discontinue lasix and to give morning dose of metoprolol.

## 2021-09-08 NOTE — Interval H&P Note (Signed)
History and Physical Interval Note:  09/08/2021 8:47 AM  Tyler Ochoa  has presented today for surgery, with the diagnosis of acute HFrEF.  The various methods of treatment have been discussed with the patient and family. After consideration of risks, benefits and other options for treatment, the patient has consented to  Procedure(s): RIGHT/LEFT HEART CATH AND CORONARY ANGIOGRAPHY (N/A) as a surgical intervention.  The patient's history has been reviewed, patient examined, no change in status, stable for surgery.  I have reviewed the patient's chart and labs.  Questions were answered to the patient's satisfaction.    Cath Lab Visit (complete for each Cath Lab visit)  Clinical Evaluation Leading to the Procedure:   ACS: No.  Non-ACS:    Anginal/Heart Failure Classification: NYHA class IV  Anti-ischemic medical therapy: Minimal Therapy (1 class of medications)  Non-Invasive Test Results: No non-invasive testing performed (LVEF < 20% by echo -> high risk)  Prior CABG: No previous CABG  Theodus Ran

## 2021-09-08 NOTE — Progress Notes (Signed)
Progress Note  Patient Name: Tyler Ochoa Date of Encounter: 09/08/2021  [facility] Cardiologist: Rockey Situ  Subjective   No chest pain, shortness of breath, or palpitations.  Felt cold during/after catheterization.  Noted to be more tachycardic upon returning to recovery area post catheterization the patient is without new complaints.  Inpatient Medications    Scheduled Meds:  aspirin EC  81 mg Oral Daily   atorvastatin  80 mg Oral QHS   dapagliflozin propanediol  10 mg Oral QAC breakfast   [START ON 09/09/2021] enoxaparin (LOVENOX) injection  40 mg Subcutaneous Q24H   feeding supplement  237 mL Oral TID BM   metoprolol succinate  12.5 mg Oral Q breakfast   sodium chloride flush  3 mL Intravenous Q12H   Continuous Infusions:  sodium chloride     PRN Meds: sodium chloride, acetaminophen **OR** acetaminophen, albuterol, guaiFENesin-dextromethorphan, hydrALAZINE, magnesium hydroxide, ondansetron **OR** ondansetron (ZOFRAN) IV, sodium chloride flush, traZODone   Vital Signs    Vitals:   09/08/21 1115 09/08/21 1130 09/08/21 1145 09/08/21 1200  BP:  119/82  93/67  Pulse:  (!) 133 (!) 127 (!) 122  Resp: (!) 22 (!) 24 20 (!) 27  Temp:      TempSrc:      SpO2: 95% 95% 94% 95%  Weight:      Height:        Intake/Output Summary (Last 24 hours) at 09/08/2021 1218 Last data filed at 09/08/2021 0800 Gross per 24 hour  Intake 1457.03 ml  Output 2750 ml  Net -1292.97 ml   Last 3 Weights 09/08/2021 09/07/2021 09/06/2021  Weight (lbs) 216 lb 7.9 oz 214 lb 14.4 oz 216 lb 11.4 oz  Weight (kg) 98.2 kg 97.478 kg 98.3 kg      Telemetry    Normal sinus rhythm and sinus tachycardia with PVCs. - Personally Reviewed  ECG    Sinus tachycardia with PVCs, rightward axis, and nonspecific T wave abnormalities. - Personally Reviewed  Physical Exam   GEN: No acute distress.   Neck: No JVD Cardiac: Tachycardic but regular without murmurs, rubs, or gallops. Respiratory: Clear to  auscultation bilaterally. GI: Soft, nontender, non-distended  MS: No edema; No deformity. Neuro:  Nonfocal  Psych: Flat affect  Labs    High Sensitivity Troponin:   Recent Labs  Lab 08/28/21 1506 08/28/21 2120  TROPONINIHS 420* 327*     Chemistry Recent Labs  Lab 09/05/21 0412 09/06/21 0424 09/07/21 0414 09/08/21 0443  NA 132* 133* 132* 131*  K 3.4* 3.6 4.0 3.9  CL 104 100 98 96*  CO2 24 24 25 26   GLUCOSE 111* 109* 134* 124*  BUN 41* 41* 42* 41*  CREATININE 1.51* 1.59* 1.46* 1.24  CALCIUM 8.1* 7.9* 8.2* 8.1*  MG 1.9 1.9 1.9  --   PROT 6.2* 6.1* 6.5 6.1*  ALBUMIN 2.4* 2.3* 2.5* 2.1*  AST 70* 72* 88* 121*  ALT 43 40 41 43  ALKPHOS 118 125 141* 153*  BILITOT 3.4* 3.2* 3.0* 2.8*  GFRNONAA 50* 47* 52* >60  ANIONGAP 4* 9 9 9     Lipids No results for input(s): CHOL, TRIG, HDL, LABVLDL, LDLCALC, CHOLHDL in the last 168 hours.  Hematology Recent Labs  Lab 09/06/21 0424 09/07/21 0414 09/08/21 0443  WBC 3.8* 4.5 4.4  RBC 4.81 5.13 5.45  HGB 14.2 15.5 16.5  HCT 39.7 42.8 45.8  MCV 82.5 83.4 84.0  MCH 29.5 30.2 30.3  MCHC 35.8 36.2* 36.0  RDW 16.8* 17.1* 17.3*  PLT 153 136* 108*   Thyroid  Recent Labs  Lab 09/08/21 0443  TSH 2.864    BNPNo results for input(s): BNP, PROBNP in the last 168 hours.  DDimer No results for input(s): DDIMER in the last 168 hours.   Radiology    CARDIAC CATHETERIZATION  Result Date: 09/08/2021 Conclusions: Multivessel coronary artery disease, as detailed below, including chronic total occlusion of the mid LAD, as well as moderate LCx and RCA disease of up to 70%. Upper normal to mildly elevated left and right heart filling pressures. Mild to moderate pulmonary hypertension. Mildly reduced Fick cardiac output/index. Recommendations: Discontinue furosemide infusion; anticipate transitioning to oral diuretic regimen tomorrow. Escalate goal-directed medical therapy as tolerated. Once optimized from heart failure standpoint, consider  viability study to determine if coronary revascularization may be beneficial. Secondary prevention of coronary artery disease. Nelva Bush, MD [facility]   Cardiac Studies   See catheterization above.  TTE (08/29/2021):  1. Left ventricular ejection fraction, by estimation, is <20%. The left  ventricle has severely decreased function. The left ventricle demonstrates  global hypokinesis. There is mild concentric left ventricular hypertrophy.  Left ventricular diastolic  parameters are consistent with Grade III diastolic dysfunction  (restrictive).   2. Right ventricular systolic function is moderately reduced. The right  ventricular size is mildly enlarged.   3. Left atrial size was mildly dilated.   4. Right atrial size was moderately dilated.   5. The mitral valve is normal in structure. Mild mitral valve  regurgitation.   6. The aortic valve is normal in structure. Aortic valve regurgitation is  not visualized. No aortic stenosis is present.   7. The inferior vena cava is dilated in size with <50% respiratory  variability, suggesting right atrial pressure of 15 mmHg.   Patient Profile     70 y.o. male with HFrEF of uncertain etiology dating back to at least March, 2022, alcohol abuse with cirrhosis and ascites, chronic kidney disease stage II-III, hypertension, and hyperlipidemia, whom we have been asked to see due to acute on chronic HFrEF and elevated troponin.  Assessment & Plan    Acute on chronic HFrEF: Symptomatically, from a symptom standpoint, patient is stable.  Minimal no significant edema or JVD noted on exam today.  Catheterization showed upper normal to mildly elevated left and right heart filling pressures in the setting of mildly reduced Fick cardiac output. -Discontinue Lasix infusion with plans to initiate oral diuretic therapy tomorrow. -Continue Farxiga and metoprolol succinate. -If blood pressure and renal function allow, would like to add low-dose ARB  as soon as tomorrow with ultimate goal of transitioning to [place]. -Consider adding spironolactone if renal function and potassium allow. -Check TSH and ferritin.  Coronary artery disease with demand ischemia: Catheterization today showed significant multivessel CAD including chronic total occlusion of the mid LAD that is well collateralized as well as moderate disease of up to 70% involving the LCx and RCA. -Continue aspirin and statin therapy. -Once patient has been optimized from heart failure standpoint, viability study should be considered to determine if revascularization may provide benefit, particularly if LVEF remains significantly depressed.  Cirrhosis with hypoalbuminemia: Albumin trending down, 2.1 today.  Bilirubin remains mildly elevated at 2.8. -Maintain net even fluid balance today; will discontinue furosemide infusion.  Acute kidney injury superimposed on chronic kidney disease: Creatinine continues to improve.  We will need to watch this closely with contrast exposure today. -Maintain net even fluid balance. -Avoid nephrotoxic agents.  Disposition: Recommend inpatient monitoring  for at least 1 more day with hopes of transitioning to oral diuretics tomorrow and potentially even adding ARB.    For questions or updates, please contact [facility] Please consult [email or web address] for contact info under [facility].    Signed, Yvonne Kendall, MD  09/08/2021, 12:18 PM

## 2021-09-09 ENCOUNTER — Encounter: Payer: Self-pay | Admitting: Internal Medicine

## 2021-09-09 DIAGNOSIS — I2510 Atherosclerotic heart disease of native coronary artery without angina pectoris: Secondary | ICD-10-CM

## 2021-09-09 DIAGNOSIS — I5021 Acute systolic (congestive) heart failure: Secondary | ICD-10-CM | POA: Diagnosis not present

## 2021-09-09 DIAGNOSIS — R945 Abnormal results of liver function studies: Secondary | ICD-10-CM | POA: Diagnosis not present

## 2021-09-09 DIAGNOSIS — R531 Weakness: Secondary | ICD-10-CM | POA: Diagnosis not present

## 2021-09-09 DIAGNOSIS — E8770 Fluid overload, unspecified: Secondary | ICD-10-CM | POA: Diagnosis not present

## 2021-09-09 LAB — CBC
HCT: 46.5 % (ref 39.0–52.0)
Hemoglobin: 16.1 g/dL (ref 13.0–17.0)
MCH: 28.2 pg (ref 26.0–34.0)
MCHC: 34.6 g/dL (ref 30.0–36.0)
MCV: 81.6 fL (ref 80.0–100.0)
Platelets: 66 10*3/uL — ABNORMAL LOW (ref 150–400)
RBC: 5.7 MIL/uL (ref 4.22–5.81)
RDW: 18 % — ABNORMAL HIGH (ref 11.5–15.5)
WBC: 4.5 10*3/uL (ref 4.0–10.5)
nRBC: 0 % (ref 0.0–0.2)

## 2021-09-09 LAB — RESP PANEL BY RT-PCR (FLU A&B, COVID) ARPGX2
Influenza A by PCR: NEGATIVE
Influenza B by PCR: NEGATIVE
SARS Coronavirus 2 by RT PCR: NEGATIVE

## 2021-09-09 LAB — COMPREHENSIVE METABOLIC PANEL
ALT: 43 U/L (ref 0–44)
AST: 145 U/L — ABNORMAL HIGH (ref 15–41)
Albumin: 2.1 g/dL — ABNORMAL LOW (ref 3.5–5.0)
Alkaline Phosphatase: 147 U/L — ABNORMAL HIGH (ref 38–126)
Anion gap: 9 (ref 5–15)
BUN: 47 mg/dL — ABNORMAL HIGH (ref 8–23)
CO2: 27 mmol/L (ref 22–32)
Calcium: 8.1 mg/dL — ABNORMAL LOW (ref 8.9–10.3)
Chloride: 97 mmol/L — ABNORMAL LOW (ref 98–111)
Creatinine, Ser: 1.32 mg/dL — ABNORMAL HIGH (ref 0.61–1.24)
GFR, Estimated: 59 mL/min — ABNORMAL LOW (ref >60)
Glucose, Bld: 115 mg/dL — ABNORMAL HIGH (ref 70–99)
Potassium: 4.1 mmol/L (ref 3.5–5.1)
Sodium: 133 mmol/L — ABNORMAL LOW (ref 135–145)
Total Bilirubin: 2.8 mg/dL — ABNORMAL HIGH (ref 0.3–1.2)
Total Protein: 6.1 g/dL — ABNORMAL LOW (ref 6.5–8.1)

## 2021-09-09 MED ORDER — MAGNESIUM HYDROXIDE 400 MG/5ML PO SUSP: 30.0000 mL | Freq: Every day | ORAL | 0 refills | Status: DC | PRN

## 2021-09-09 MED ORDER — FUROSEMIDE 40 MG PO TABS
40.0000 mg | ORAL_TABLET | Freq: Every day | ORAL | Status: DC
  Administered 2021-09-09: 40 mg via ORAL
  Filled 2021-09-09 (×2): qty 1

## 2021-09-09 MED ORDER — FUROSEMIDE 40 MG PO TABS: 40.0000 mg | ORAL_TABLET | Freq: Every day | ORAL | 5 refills | Status: DC

## 2021-09-09 MED ORDER — ENSURE ENLIVE PO LIQD: 237.0000 mL | Freq: Three times a day (TID) | ORAL | 12 refills | Status: DC

## 2021-09-09 NOTE — TOC Transition Note (Signed)
Transition of Care [facility]) - CM/SW Discharge Note   Patient Details  Name: Tyler Ochoa MRN: [id number] Date of Birth: 09-18-1951  [facility] of [facility]) CM/SW Contact:  Chapman Fitch, RN Phone Number: 09/09/2021, 2:16 PM   Clinical Narrative:     Patient will DC to:[facility] Anticipated DC date:09/09/21  Family notified:Grace Personal assistant by:EMS  Per MD patient ready for DC to . RN, patient, patient's family, and facility notified of DC. Discharge Summary sent to facility. RN given number for report. DC packet on chart. Ambulance transport requested for patient.  TOC signing off.  Bevelyn Ngo [facility] [phone]     Barriers to Discharge: Continued Medical Work up   Patient Goals and CMS Choice Patient states their goals for this hospitalization and ongoing recovery are:: to go to SNF   Choice offered to / list presented to : Patient  Discharge Placement                       Discharge Plan and Services In-house Referral: Clinical Social Work   Post Acute Care Choice: Skilled Nursing Facility                               Social Determinants of Health (SDOH) Interventions     Readmission Risk Interventions No flowsheet data found.

## 2021-09-09 NOTE — Progress Notes (Signed)
Progress Note  Patient Name: Tyler Ochoa Date of Encounter: 09/09/2021  [facility] Cardiologist: None   Subjective   Denies shortness of breath, edema is much better.  Plan for DC to skilled nursing facility later today.  Blood pressures are below, with systolics in the 90s.  Inpatient Medications    Scheduled Meds:  aspirin EC  81 mg Oral Daily   atorvastatin  80 mg Oral QHS   dapagliflozin propanediol  10 mg Oral QAC breakfast   enoxaparin (LOVENOX) injection  40 mg Subcutaneous Q24H   feeding supplement  237 mL Oral TID BM   furosemide  40 mg Oral Daily   sodium chloride flush  3 mL Intravenous Q12H   Continuous Infusions:  sodium chloride     PRN Meds: sodium chloride, acetaminophen **OR** acetaminophen, albuterol, guaiFENesin-dextromethorphan, magnesium hydroxide, ondansetron **OR** ondansetron (ZOFRAN) IV, sodium chloride flush, traZODone   Vital Signs    Vitals:   09/09/21 0124 09/09/21 0510 09/09/21 0753 09/09/21 1120  BP: 102/78 104/76 93/60 91/60   Pulse: 99 (!) 102 99 (!) 105  Resp: 18 20 17 17   Temp: (!) 97.3 F (36.3 C) 97.6 F (36.4 C) 97.6 F (36.4 C) 98 F (36.7 C)  TempSrc:   Oral Oral  SpO2: 95% 99% 98% 99%  Weight:   95.9 kg   Height:        Intake/Output Summary (Last 24 hours) at 09/09/2021 1332 Last data filed at 09/09/2021 0154 Gross per 24 hour  Intake 597 ml  Output 970 ml  Net -373 ml   Last 3 Weights 09/09/2021 09/08/2021 09/07/2021  Weight (lbs) 211 lb 8 oz 216 lb 7.9 oz 214 lb 14.4 oz  Weight (kg) 95.936 kg 98.2 kg 97.478 kg      Telemetry    Sinus tachycardia, heart rate 101- Personally Reviewed  ECG     - Personally Reviewed  Physical Exam   GEN: No acute distress.  Soft-spoken, weak. Neck: No JVD Cardiac: Regular, tachycardic, distant. Respiratory: Clear to auscultation bilaterally. GI: Soft, nontender, non-distended  MS: No edema; No deformity. Neuro:  Nonfocal  Psych: Normal affect   Labs    High  Sensitivity Troponin:   Recent Labs  Lab 08/28/21 1506 08/28/21 2120  TROPONINIHS 420* 327*     Chemistry Recent Labs  Lab 09/05/21 0412 09/06/21 0424 09/07/21 0414 09/08/21 0443 09/09/21 0623  NA 132* 133* 132* 131* 133*  K 3.4* 3.6 4.0 3.9 4.1  CL 104 100 98 96* 97*  CO2 24 24 25 26 27   GLUCOSE 111* 109* 134* 124* 115*  BUN 41* 41* 42* 41* 47*  CREATININE 1.51* 1.59* 1.46* 1.24 1.32*  CALCIUM 8.1* 7.9* 8.2* 8.1* 8.1*  MG 1.9 1.9 1.9  --   --   PROT 6.2* 6.1* 6.5 6.1* 6.1*  ALBUMIN 2.4* 2.3* 2.5* 2.1* 2.1*  AST 70* 72* 88* 121* 145*  ALT 43 40 41 43 43  ALKPHOS 118 125 141* 153* 147*  BILITOT 3.4* 3.2* 3.0* 2.8* 2.8*  GFRNONAA 50* 47* 52* >60 59*  ANIONGAP 4* 9 9 9 9     Lipids No results for input(s): CHOL, TRIG, HDL, LABVLDL, LDLCALC, CHOLHDL in the last 168 hours.  Hematology Recent Labs  Lab 09/07/21 0414 09/08/21 0443 09/09/21 0623  WBC 4.5 4.4 4.5  RBC 5.13 5.45 5.70  HGB 15.5 16.5 16.1  HCT 42.8 45.8 46.5  MCV 83.4 84.0 81.6  MCH 30.2 30.3 28.2  MCHC 36.2* 36.0 34.6  RDW 17.1* 17.3* 18.0*  PLT 136* 108* 66*   Thyroid  Recent Labs  Lab 09/08/21 0443  TSH 2.864    BNPNo results for input(s): BNP, PROBNP in the last 168 hours.  DDimer No results for input(s): DDIMER in the last 168 hours.   Radiology    CARDIAC CATHETERIZATION  Result Date: 09/08/2021 Conclusions: Multivessel coronary artery disease, as detailed below, including chronic total occlusion of the mid LAD, as well as moderate LCx and RCA disease of up to 70%. Upper normal to mildly elevated left and right heart filling pressures. Mild to moderate pulmonary hypertension. Mildly reduced Fick cardiac output/index. Recommendations: Discontinue furosemide infusion; anticipate transitioning to oral diuretic regimen tomorrow. Escalate goal-directed medical therapy as tolerated. Once optimized from heart failure standpoint, consider viability study to determine if coronary revascularization may  be beneficial. Secondary prevention of coronary artery disease. Nelva Bush, MD [facility]   Cardiac Studies   Lhc as above.  Echo 08/29/2021  1. Left ventricular ejection fraction, by estimation, is <20%. The left  ventricle has severely decreased function. The left ventricle demonstrates  global hypokinesis. There is mild concentric left ventricular hypertrophy.  Left ventricular diastolic  parameters are consistent with Grade III diastolic dysfunction  (restrictive).   2. Right ventricular systolic function is moderately reduced. The right  ventricular size is mildly enlarged.   3. Left atrial size was mildly dilated.   4. Right atrial size was moderately dilated.   5. The mitral valve is normal in structure. Mild mitral valve  regurgitation.   6. The aortic valve is normal in structure. Aortic valve regurgitation is  not visualized. No aortic stenosis is present.   7. The inferior vena cava is dilated in size with <50% respiratory  variability, suggesting right atrial pressure of 15 mmHg.   Patient Profile     70 y.o. male history of multivessel CAD, HFrEF, cirrhosis, CKD 3, hypertension presenting with shortness of breath and edema.  Being seen for CAD and CHF  Assessment & Plan    CAD (CTO mid LAD, moderate LCx, RCA) -Denies chest pain -Plan operation viability work-up via CMR -Continue aspirin, Lipitor 80.  2.  Ischemic cardiomyopathy, EF less than 20% -Low blood pressures preventing use of GDMT, systolic in the 0000000 -Start Lasix 40 mg daily. -Stop Toprol-XL -Continue Farxiga.  Titrate CHF meds as BP permits.  3.  Cirrhosis, CKD -Avoid nephrotoxins, management per GI/medicine.  Patient can be discharged to rehab facility from cardiac perspective on current medications and recommendations as above.  Close follow-up as outpatient recommended.  Total encounter time 35 minutes  Greater than 50% was spent in counseling and coordination of care with the  patient     Signed, Kate Sable, MD  09/09/2021, 1:32 PM

## 2021-09-09 NOTE — TOC Progression Note (Signed)
Transition of Care [facility]) - Progression Note    Patient Details  Name: MAVRICK MCQUIGG MRN: [id number] Date of Birth: 1951-10-24  Transition of Care [facility] CM/SW Contact  Chapman Fitch, RN Phone Number: 09/09/2021, 10:27 AM  Clinical Narrative:     Insurance auth for SNF valid through today.  MD notified    Expected Discharge Plan: Skilled Nursing Facility Barriers to Discharge: Continued Medical Work up  Expected Discharge Plan and Services Expected Discharge Plan: Skilled Nursing Facility In-house Referral: Clinical Social Work   Post Acute Care Choice: Skilled Nursing Facility Living arrangements for the past 2 months: Single Family Home                                       Social Determinants of Health (SDOH) Interventions    Readmission Risk Interventions No flowsheet data found.

## 2021-09-09 NOTE — Discharge Summary (Signed)
Physician Discharge Summary  Tyler Ochoa [id number] DOB: 1951-07-07 DOA: 08/28/2021  PCP: Manson Allan, MD  Admit date: 08/28/2021 Discharge date: 09/09/2021  Admitted From: Home Disposition: SNF  Recommendations for Outpatient Follow-up:  Follow up with PCP in 1-2 weeks Follow-up with cardiology in 1 to 2 weeks Please obtain BMP/CBC in one week Please follow up on the following pending results: None  Home Health: No Equipment/Devices: Discharge Condition: Stable CODE STATUS: Full Diet recommendation: Heart Healthy / Carb Modified   Brief/Interim Summary: 70 year old male with PMH of systolic CHF, chronic edema, osteoarthritis, debility, HTN and HLD presenting with generalized weakness, dry cough, DOE, BLE edema and orthopnea, and admitted with acute on chronic systolic CHF, possible non-STEMI and AKI.  BNP elevated to 3200.  Troponin elevated to 420 and improved to 327.  Initially started on IV Lasix and heparin infusion, IV Lasix later transitioned to Lasix infusion due to worsening renal function.   TTE with LVEF <20% (25 to 30% in 01/2021), G3 DD and moderate RAE.  His cardiologist, Dr. Humphrey Rolls was consulted and initially there was no plan for further work-up.  Later patient asked to switch cardiologist and [facility] was consulted.  Patient was taken to Cath Lab for right and left cardiac catheterization on 09/08/2021 which showed significant multivessel CAD including chronic total occlusion of the mid LAD that is well collateralized as well as moderate disease of up to 70% involving the LCx and RCA.  Cardiology is recommending optimization of medical management as tolerated.  His Lasix infusion was discontinued and he can start p.o. Lasix at 40 mg daily after discharge.  Mild increase in creatinine again today.  His home dose of Toprol and Entresto was discontinued due to softer blood pressure.  He needs a close follow-up with cardiology and they will slowly reintroduce medications for  optimization of medical management. He will continue with [place] and follow-up with his primary care provider for better control of diabetes.  He was also found to have liver cirrhosis on CT abdomen and pelvis, ammonia levels within normal limits.  Elevated liver enzymes with hypoalbuminemia.  He denies any alcohol use.Child Pugh class B with MELD-sodium score of 30.  Which makes 90-day mortality at 27 to 32%.  Concern of cardiohepatic etiology.  Patient did had AKI with azotemia which improved initially with mild worsening of creatinine after the cardiac catheterization.  Patient will need a repeat BMP within a week.  He was also found to have a right renal mass on CT abdomen and pelvis.  Patient will need a repeat CT scan with contrast RN designated MRI with contrast which can be done as an outpatient.  He will also need an outpatient urology appointment for further management.  Our physical therapist evaluated him and they were recommending rehab where he is being discharged for further management.  Patient was also found to have a stage II pressure injury on buttocks which were present on admission.  He can continue with gel dressing and frequent position changes to prevent exacerbation.  He will continue with rest of his home medications except as mentioned above and will follow-up with his providers.  Discharge Diagnoses:  Active Problems:   NSTEMI (non-ST elevated myocardial infarction) (HCC)   Pressure injury of skin   HFrEF (heart failure with reduced ejection fraction) (HCC)   Liver function abnormality   Hypervolemia   Acute HFrEF (heart failure with reduced ejection fraction) (HCC)   Acute renal failure (HCC)   Weakness  Discharge Instructions  Discharge Instructions     Diet - low sodium heart healthy   Complete by: As directed    Discharge wound care:   Complete by: As directed    Place gel dressing and frequently change position.   Increase activity slowly    Complete by: As directed       Allergies as of 09/09/2021   No Known Allergies      Medication List     STOP taking these medications    metoprolol succinate 50 MG 24 hr tablet Commonly known as: Toprol XL   sacubitril-valsartan 24-26 MG Commonly known as: ENTRESTO       TAKE these medications    acetaminophen 500 MG tablet Commonly known as: TYLENOL Take 500-1,000 mg by mouth every 6 (six) hours as needed for mild pain or moderate pain.   aspirin EC 81 MG tablet Take 1 tablet (81 mg total) by mouth daily. Swallow whole.   atorvastatin 80 MG tablet Commonly known as: LIPITOR Take 1 tablet (80 mg total) by mouth at bedtime.   dapagliflozin propanediol 10 MG Tabs tablet Commonly known as: Farxiga Take 1 tablet (10 mg total) by mouth daily before breakfast.   feeding supplement Liqd Take 237 mLs by mouth 3 (three) times daily between meals.   furosemide 40 MG tablet Commonly known as: Lasix Take 1 tablet (40 mg total) by mouth daily. What changed: how much to take   magnesium hydroxide 400 MG/5ML suspension Commonly known as: MILK OF MAGNESIA Take 30 mLs by mouth daily as needed for mild constipation.   potassium chloride SA 20 MEQ tablet Commonly known as: KLOR-CON Take 1 tablet (20 mEq total) by mouth daily.   ProAir HFA 108 (90 Base) MCG/ACT inhaler Generic drug: albuterol Inhale 1-2 puffs into the lungs every 6 (six) hours as needed for wheezing.               Discharge Care Instructions  (From admission, onward)           Start     Ordered   09/09/21 0000  Discharge wound care:       Comments: Place gel dressing and frequently change position.   09/09/21 1340            Contact information for follow-up providers     Queen Slough, MD. Schedule an appointment as soon as possible for a visit in 1 week(s).   Specialty: Student Contact information: [address] [place] [address] [phone]         Yvonne Kendall,  MD. Schedule an appointment as soon as possible for a visit in 1 week(s).   Specialty: Cardiology Contact information: [address] [place] [address] [phone]              Contact information for after-discharge care     Destination     [facility] .   Service: Skilled Nursing Contact information: [address] [place] [address] [phone]                    No Known Allergies  Consultations: Cardiology  Procedures/Studies: CT ABDOMEN PELVIS WO CONTRAST  Result Date: 09/01/2021 CLINICAL DATA:  Lower extremity edema, ascites, renal mass suspected EXAM: CT ABDOMEN AND PELVIS WITHOUT CONTRAST TECHNIQUE: Multidetector CT imaging of the abdomen and pelvis was performed following the standard protocol without IV contrast. COMPARISON:  Abdominal ultrasound, 09/01/2021 FINDINGS: Lower  chest: Cardiomegaly. Coronary artery calcifications. Rounded subpleural opacity of the dependent right lower lobe, measuring 2.2 cm (series 3, image 9). Trace bilateral pleural effusions. Hepatobiliary: Coarse, nodular morphology of the liver. No solid liver abnormality is seen. Faintly calcified gallstones and sludge in the dependent gallbladder. Mild thickening of the gallbladder wall with pericholecystic fluid, nonspecific in the setting of ascites. Pancreas: Unremarkable. No pancreatic ductal dilatation or surrounding inflammatory changes. Spleen: Normal in size without significant abnormality. Adrenals/Urinary Tract: Adrenal glands are unremarkable. A suspected right upper pole renal mass is not clearly appreciated by noncontrast CT (series 6, image 41, series 5, image 66). Exophytic cyst of the superior pole of the left kidney. No urinary tract calculi or hydronephrosis bladder wall thickening. Stomach/Bowel: Stomach is within normal limits. Appendix appears normal. No evidence of bowel wall thickening, distention, or  inflammatory changes. Vascular/Lymphatic: Aortic atherosclerosis. No enlarged abdominal or pelvic lymph nodes. Reproductive: Mild prostatomegaly. Other: Severe anasarca. Small volume ascites throughout the abdomen and pelvis. Musculoskeletal: No acute or significant osseous findings. IMPRESSION: 1. A suspected right upper pole renal mass is not clearly appreciated by noncontrast CT. Noncontrast CT is of limited value for the detection and characterization of renal masses. Recommend multiphasic contrast enhanced CT or MRI on a nonemergent basis, when clinically appropriate for characterization. 2. Rounded subpleural opacity of the dependent right lower lobe, measuring 2.2 cm. This is nonspecific, however this appearance suggests pulmonary infarction. Differential considerations include both airspace disease and parenchymal pulmonary nodule. Consider contrast enhanced CT angiogram of the chest to further evaluate. 3. Coarse, nodular morphology of the liver, suggestive of cirrhosis. 4. Small volume ascites throughout the abdomen and pelvis. 5. Severe anasarca. 6. Cholelithiasis. Mild thickening of the gallbladder wall with pericholecystic fluid, nonspecific in the setting of ascites. 7. Mild prostatomegaly. 8. Coronary artery disease. Aortic Atherosclerosis (ICD10-I70.0). Electronically Signed   By: Delanna Ahmadi M.D.   On: 09/01/2021 18:20   CARDIAC CATHETERIZATION  Result Date: 09/08/2021 Conclusions: Multivessel coronary artery disease, as detailed below, including chronic total occlusion of the mid LAD, as well as moderate LCx and RCA disease of up to 70%. Upper normal to mildly elevated left and right heart filling pressures. Mild to moderate pulmonary hypertension. Mildly reduced Fick cardiac output/index. Recommendations: Discontinue furosemide infusion; anticipate transitioning to oral diuretic regimen tomorrow. Escalate goal-directed medical therapy as tolerated. Once optimized from heart failure  standpoint, consider viability study to determine if coronary revascularization may be beneficial. Secondary prevention of coronary artery disease. Nelva Bush, MD [facility]  US Venous Img Lower Bilateral (DVT)  Result Date: 08/29/2021 CLINICAL DATA:  Extremity swelling EXAM: BILATERAL LOWER EXTREMITY VENOUS DOPPLER ULTRASOUND TECHNIQUE: Gray-scale sonography with compression, as well as color and duplex ultrasound, were performed to evaluate the deep venous system(s) from the level of the common femoral vein through the popliteal and proximal calf veins. COMPARISON:  None. FINDINGS: VENOUS Normal compressibility of the common femoral, superficial femoral, and popliteal veins, as well as the visualized calf veins. Visualized portions of profunda femoral vein and great saphenous vein unremarkable. No filling defects to suggest DVT on grayscale or color Doppler imaging. Doppler waveforms show normal direction of venous flow, normal respiratory plasticity and response to augmentation. Limited views of the contralateral common femoral vein are unremarkable. OTHER Soft tissue edema bilaterally. Limitations: none IMPRESSION: Negative. Electronically Signed   By: Valentino Saxon M.D.   On: 08/29/2021 18:37   DG Chest Portable 1 View  Result Date: 08/28/2021 CLINICAL DATA:  Increasing weakness  x2 weeks. EXAM: PORTABLE CHEST 1 VIEW COMPARISON:  February 02, 2021 FINDINGS: The cardiac silhouette is moderately enlarged and unchanged in size. Both lungs are clear. Degenerative changes are seen throughout the thoracic spine. IMPRESSION: Stable cardiomegaly without acute or active cardiopulmonary disease. Electronically Signed   By: Virgina Norfolk M.D.   On: 08/28/2021 22:13   ECHOCARDIOGRAM COMPLETE  Result Date: 08/29/2021    ECHOCARDIOGRAM REPORT   Patient Name:   Tyler Ochoa Date of Exam: 08/29/2021 Medical Rec #:  [id number]        Height:       74.0 in Accession #:    [id number]       Weight:        224.0 lb Date of Birth:  May 26, 1951         BSA:          2.281 m Patient Age:    37 years         BP:           130/93 mmHg Patient Gender: M                HR:           101 bpm. Exam Location:  [facility] Procedure: 2D Echo and Intracardiac Opacification Agent Indications:     NSTEMI  History:         Patient has prior history of Echocardiogram examinations. CHF,                  Signs/Symptoms:Edema; Risk Factors:Hypertension, Dyslipidemia                  and CKD.  Sonographer:     L Thornton-Maynard Referring Phys:  [id number] A MANSY Diagnosing Phys: Donnelly Angelica IMPRESSIONS  1. Left ventricular ejection fraction, by estimation, is <20%. The left ventricle has severely decreased function. The left ventricle demonstrates global hypokinesis. There is mild concentric left ventricular hypertrophy. Left ventricular diastolic parameters are consistent with Grade III diastolic dysfunction (restrictive).  2. Right ventricular systolic function is moderately reduced. The right ventricular size is mildly enlarged.  3. Left atrial size was mildly dilated.  4. Right atrial size was moderately dilated.  5. The mitral valve is normal in structure. Mild mitral valve regurgitation.  6. The aortic valve is normal in structure. Aortic valve regurgitation is not visualized. No aortic stenosis is present.  7. The inferior vena cava is dilated in size with <50% respiratory variability, suggesting right atrial pressure of 15 mmHg. FINDINGS  Left Ventricle: Left ventricular ejection fraction, by estimation, is <20%. The left ventricle has severely decreased function. The left ventricle demonstrates global hypokinesis. Definity contrast agent was given IV to delineate the left ventricular endocardial borders. The left ventricular internal cavity size was normal in size. There is mild concentric left ventricular hypertrophy. Left ventricular diastolic parameters are consistent with Grade III diastolic dysfunction (restrictive). Right  Ventricle: The right ventricular size is mildly enlarged. No increase in right ventricular wall thickness. Right ventricular systolic function is moderately reduced. Left Atrium: Left atrial size was mildly dilated. Right Atrium: Right atrial size was moderately dilated. Pericardium: Trivial pericardial effusion is present. Mitral Valve: The mitral valve is normal in structure. Mild mitral valve regurgitation. Tricuspid Valve: The tricuspid valve is normal in structure. Tricuspid valve regurgitation is mild. Aortic Valve: The aortic valve is normal in structure. Aortic valve regurgitation is not visualized. No aortic stenosis is present. Aortic valve mean gradient measures 3.0  mmHg. Aortic valve peak gradient measures 4.2 mmHg. Aortic valve area, by VTI measures 1.78 cm. Pulmonic Valve: The pulmonic valve was normal in structure. Pulmonic valve regurgitation is mild. Aorta: The aortic root was not well visualized. Venous: The inferior vena cava is dilated in size with less than 50% respiratory variability, suggesting right atrial pressure of 15 mmHg. IAS/Shunts: The interatrial septum was not assessed.  LEFT VENTRICLE PLAX 2D LVIDd:         4.90 cm      Diastology LVIDs:         4.55 cm      LV e' medial:    3.87 cm/s LV PW:         1.30 cm      LV E/e' medial:  16.2 LV IVS:        1.00 cm      LV e' lateral:   5.93 cm/s LVOT diam:     2.10 cm      LV E/e' lateral: 10.6 LV SV:         25 LV SV Index:   11 LVOT Area:     3.46 cm  LV Volumes (MOD) LV vol d, MOD A2C: 148.0 ml LV vol d, MOD A4C: 144.0 ml LV vol s, MOD A2C: 100.0 ml LV vol s, MOD A4C: 125.0 ml LV SV MOD A2C:     48.0 ml LV SV MOD A4C:     144.0 ml LV SV MOD BP:      33.7 ml RIGHT VENTRICLE RV S prime:     5.18 cm/s TAPSE (M-mode): 0.6 cm LEFT ATRIUM             Index LA diam:        3.50 cm 1.53 cm/m LA Vol (A2C):   75.9 ml 33.28 ml/m LA Vol (A4C):   84.2 ml 36.92 ml/m LA Biplane Vol: 81.3 ml 35.65 ml/m  AORTIC VALVE                    PULMONIC  VALVE AV Area (Vmax):    1.98 cm     PV Vmax:          0.69 m/s AV Area (Vmean):   1.76 cm     PV Peak grad:     1.9 mmHg AV Area (VTI):     1.78 cm     PR End Diast Vel: 8.07 msec AV Vmax:           102.00 cm/s AV Vmean:          80.000 cm/s AV VTI:            0.143 m AV Peak Grad:      4.2 mmHg AV Mean Grad:      3.0 mmHg LVOT Vmax:         58.45 cm/s LVOT Vmean:        40.650 cm/s LVOT VTI:          0.073 m LVOT/AV VTI ratio: 0.51  AORTA Ao Root diam: 3.60 cm MV E velocity: 62.60 cm/s  TRICUSPID VALVE                            TR Peak grad:   26.2 mmHg                            TR Vmax:  256.00 cm/s                             SHUNTS                            Systemic VTI:  0.07 m                            Systemic Diam: 2.10 cm Donnelly Angelica Electronically signed by Donnelly Angelica Signature Date/Time: 08/29/2021/12:05:56 PM    Final    US Abdomen Limited RUQ (LIVER/GB)  Result Date: 09/01/2021 CLINICAL DATA:  Abnormal liver function test EXAM: ULTRASOUND ABDOMEN LIMITED RIGHT UPPER QUADRANT COMPARISON:  None. FINDINGS: Gallbladder: Gallbladder sludge and nonshadowing but discrete appearing filling defects favoring calculi measuring up to 9 mm. Borderline gallbladder wall thickening but no pericholecystic edema or focal tenderness. Common bile duct: Diameter: 3 mm Liver: Suggested surface lobulation of the liver with large fissures. No focal liver mass. Portal vein is patent on color Doppler imaging with normal direction of blood flow towards the liver. Other: 3 cm lesion in the upper pole right kidney. Trace ascites in the abdomen. IMPRESSION: 1. Suspected liver cirrhosis.  Trace ascites. 2. Gallbladder sludge/calculi. 3. Right upper pole renal mass measuring 3.2 cm, not seen in February 2022. Recommend enhanced CT or MRI if possible. Electronically Signed   By: Jorje Guild M.D.   On: 09/01/2021 10:01    Subjective: Patient was seen and examined today.  Lying comfortably in bed.  Denies any  chest pain or shortness of breath.  Significant other at bedside.  Discharge Exam: Vitals:   09/09/21 0753 09/09/21 1120  BP: 93/60 91/60  Pulse: 99 (!) 105  Resp: 17 17  Temp: 97.6 F (36.4 C) 98 F (36.7 C)  SpO2: 98% 99%   Vitals:   09/09/21 0124 09/09/21 0510 09/09/21 0753 09/09/21 1120  BP: 102/78 104/76 93/60 91/60   Pulse: 99 (!) 102 99 (!) 105  Resp: 18 20 17 17   Temp: (!) 97.3 F (36.3 C) 97.6 F (36.4 C) 97.6 F (36.4 C) 98 F (36.7 C)  TempSrc:   Oral Oral  SpO2: 95% 99% 98% 99%  Weight:   95.9 kg   Height:        General: Pt is alert, awake, not in acute distress Cardiovascular: RRR, S1/S2 +, no rubs, no gallops Respiratory: CTA bilaterally, no wheezing, no rhonchi Abdominal: Soft, NT, ND, bowel sounds + Extremities: no edema, no cyanosis   The results of significant diagnostics from this hospitalization (including imaging, microbiology, ancillary and laboratory) are listed below for reference.    Microbiology: Recent Results (from the past 240 hour(s))  SARS CORONAVIRUS 2 (TAT 6-24 HRS) Nasopharyngeal Nasopharyngeal Swab     Status: None   Collection Time: 09/06/21 12:00 PM   Specimen: Nasopharyngeal Swab  Result Value Ref Range Status   SARS Coronavirus 2 NEGATIVE NEGATIVE Final    Comment: (NOTE) SARS-CoV-2 target nucleic acids are NOT DETECTED.  The SARS-CoV-2 RNA is generally detectable in upper and lower respiratory specimens during the acute phase of infection. Negative results do not preclude SARS-CoV-2 infection, do not rule out co-infections with other pathogens, and should not be used as the sole basis for treatment or other patient management decisions. Negative results must be combined with clinical observations, patient history, and epidemiological information. The expected result  is Negative.  Fact Sheet for Patients: SugarRoll.be  Fact Sheet for Healthcare  Providers: https://www.woods-mathews.com/  This test is not yet approved or cleared by the [place] FDA and  has been authorized for detection and/or diagnosis of SARS-CoV-2 by FDA under an Emergency Use Authorization (EUA). This EUA will remain  in effect (meaning this test can be used) for the duration of the COVID-19 declaration under Se ction 564(b)(1) of the Act, 21 U.S.C. section 360bbb-3(b)(1), unless the authorization is terminated or revoked sooner.  Performed at [facility], [place] [address], [place], [place] [address]   Resp Panel by RT-PCR (Flu A&B, Covid) Nasopharyngeal Swab     Status: None   Collection Time: 09/09/21 12:40 PM   Specimen: Nasopharyngeal Swab; Nasopharyngeal(NP) swabs in vial transport medium  Result Value Ref Range Status   SARS Coronavirus 2 by RT PCR NEGATIVE NEGATIVE Final    Comment: (NOTE) SARS-CoV-2 target nucleic acids are NOT DETECTED.  The SARS-CoV-2 RNA is generally detectable in upper respiratory specimens during the acute phase of infection. The lowest concentration of SARS-CoV-2 viral copies this assay can detect is 138 copies/mL. A negative result does not preclude SARS-Cov-2 infection and should not be used as the sole basis for treatment or other patient management decisions. A negative result may occur with  improper specimen collection/handling, submission of specimen other than nasopharyngeal swab, presence of viral mutation(s) within the areas targeted by this assay, and inadequate number of viral copies(<138 copies/mL). A negative result must be combined with clinical observations, patient history, and epidemiological information. The expected result is Negative.  Fact Sheet for Patients:  EntrepreneurPulse.com.au  Fact Sheet for Healthcare Providers:  IncredibleEmployment.be  This test is no t yet approved or cleared by the [place] FDA and  has been authorized  for detection and/or diagnosis of SARS-CoV-2 by FDA under an Emergency Use Authorization (EUA). This EUA will remain  in effect (meaning this test can be used) for the duration of the COVID-19 declaration under Section 564(b)(1) of the Act, 21 U.S.C.section 360bbb-3(b)(1), unless the authorization is terminated  or revoked sooner.       Influenza A by PCR NEGATIVE NEGATIVE Final   Influenza B by PCR NEGATIVE NEGATIVE Final    Comment: (NOTE) The Xpert Xpress SARS-CoV-2/FLU/RSV plus assay is intended as an aid in the diagnosis of influenza from Nasopharyngeal swab specimens and should not be used as a sole basis for treatment. Nasal washings and aspirates are unacceptable for Xpert Xpress SARS-CoV-2/FLU/RSV testing.  Fact Sheet for Patients: EntrepreneurPulse.com.au  Fact Sheet for Healthcare Providers: IncredibleEmployment.be  This test is not yet approved or cleared by the [place] FDA and has been authorized for detection and/or diagnosis of SARS-CoV-2 by FDA under an Emergency Use Authorization (EUA). This EUA will remain in effect (meaning this test can be used) for the duration of the COVID-19 declaration under Section 564(b)(1) of the Act, 21 U.S.C. section 360bbb-3(b)(1), unless the authorization is terminated or revoked.  Performed at [facility], [address] [place], [place] [address]      Labs: BNP (last 3 results) Recent Labs    02/05/21 0415 08/28/21 1506 08/30/21 0448  BNP 1,011.5* 3,231.8* [id number]*   Basic Metabolic Panel: Recent Labs  Lab 09/03/21 0412 09/04/21 0353 09/05/21 0412 09/06/21 0424 09/07/21 0414 09/08/21 0443 09/09/21 0623  NA 135 133* 132* 133* 132* 131* 133*  K 3.7 3.3* 3.4* 3.6 4.0 3.9 4.1  CL 108 104 104 100 98  96* 97*  CO2 21* 21* 24 24 25 26 27   GLUCOSE 111* 105* 111* 109* 134* 124* 115*  BUN 47* 44* 41* 41* 42* 41* 47*  CREATININE 1.63* 1.55* 1.51* 1.59* 1.46* 1.24  1.32*  CALCIUM 8.0* 8.0* 8.1* 7.9* 8.2* 8.1* 8.1*  MG 2.1 2.1 1.9 1.9 1.9  --   --   PHOS 2.5 2.2* 2.6 2.2* 2.7  --   --    Liver Function Tests: Recent Labs  Lab 09/05/21 0412 09/06/21 0424 09/07/21 0414 09/08/21 0443 09/09/21 0623  AST 70* 72* 88* 121* 145*  ALT 43 40 41 43 43  ALKPHOS 118 125 141* 153* 147*  BILITOT 3.4* 3.2* 3.0* 2.8* 2.8*  PROT 6.2* 6.1* 6.5 6.1* 6.1*  ALBUMIN 2.4* 2.3* 2.5* 2.1* 2.1*   No results for input(s): LIPASE, AMYLASE in the last 168 hours. Recent Labs  Lab 09/02/21 1527 09/03/21 0412  AMMONIA 30 22   CBC: Recent Labs  Lab 09/05/21 0412 09/06/21 0424 09/07/21 0414 09/08/21 0443 09/09/21 0623  WBC 4.0 3.8* 4.5 4.4 4.5  HGB 14.4 14.2 15.5 16.5 16.1  HCT 39.7 39.7 42.8 45.8 46.5  MCV 82.9 82.5 83.4 84.0 81.6  PLT 153 153 136* 108* 66*   Cardiac Enzymes: No results for input(s): CKTOTAL, CKMB, CKMBINDEX, TROPONINI in the last 168 hours. BNP: Invalid input(s): POCBNP CBG: Recent Labs  Lab 09/08/21 1725  GLUCAP 160*   D-Dimer No results for input(s): DDIMER in the last 72 hours. Hgb A1c No results for input(s): HGBA1C in the last 72 hours. Lipid Profile No results for input(s): CHOL, HDL, LDLCALC, TRIG, CHOLHDL, LDLDIRECT in the last 72 hours. Thyroid function studies Recent Labs    09/08/21 0443  TSH 2.864   Anemia work up Recent Labs    09/08/21 0443  FERRITIN 368*   Urinalysis    Component Value Date/Time   COLORURINE YELLOW (A) 03/07/2021 1705   APPEARANCEUR CLEAR (A) 03/07/2021 1705   LABSPEC 1.010 03/07/2021 1705   PHURINE 5.0 03/07/2021 1705   GLUCOSEU >=500 (A) 03/07/2021 1705   HGBUR SMALL (A) 03/07/2021 1705   [place] 03/07/2021 1705   KETONESUR NEGATIVE 03/07/2021 1705   PROTEINUR 30 (A) 03/07/2021 1705   NITRITE NEGATIVE 03/07/2021 1705   LEUKOCYTESUR NEGATIVE 03/07/2021 1705   Sepsis Labs Invalid input(s): PROCALCITONIN,  WBC,  LACTICIDVEN Microbiology Recent Results (from the  past 240 hour(s))  SARS CORONAVIRUS 2 (TAT 6-24 HRS) Nasopharyngeal Nasopharyngeal Swab     Status: None   Collection Time: 09/06/21 12:00 PM   Specimen: Nasopharyngeal Swab  Result Value Ref Range Status   SARS Coronavirus 2 NEGATIVE NEGATIVE Final    Comment: (NOTE) SARS-CoV-2 target nucleic acids are NOT DETECTED.  The SARS-CoV-2 RNA is generally detectable in upper and lower respiratory specimens during the acute phase of infection. Negative results do not preclude SARS-CoV-2 infection, do not rule out co-infections with other pathogens, and should not be used as the sole basis for treatment or other patient management decisions. Negative results must be combined with clinical observations, patient history, and epidemiological information. The expected result is Negative.  Fact Sheet for Patients: SugarRoll.be  Fact Sheet for Healthcare Providers: https://www.woods-mathews.com/  This test is not yet approved or cleared by the [place] FDA and  has been authorized for detection and/or diagnosis of SARS-CoV-2 by FDA under an Emergency Use Authorization (EUA). This EUA will remain  in effect (meaning this test can be used) for the duration of the COVID-19  declaration under Se ction 564(b)(1) of the Act, 21 U.S.C. section 360bbb-3(b)(1), unless the authorization is terminated or revoked sooner.  Performed at [facility], [place] [address] [place], [place] [address]   Resp Panel by RT-PCR (Flu A&B, Covid) Nasopharyngeal Swab     Status: None   Collection Time: 09/09/21 12:40 PM   Specimen: Nasopharyngeal Swab; Nasopharyngeal(NP) swabs in vial transport medium  Result Value Ref Range Status   SARS Coronavirus 2 by RT PCR NEGATIVE NEGATIVE Final    Comment: (NOTE) SARS-CoV-2 target nucleic acids are NOT DETECTED.  The SARS-CoV-2 RNA is generally detectable in upper respiratory specimens during the acute phase of infection. The  lowest concentration of SARS-CoV-2 viral copies this assay can detect is 138 copies/mL. A negative result does not preclude SARS-Cov-2 infection and should not be used as the sole basis for treatment or other patient management decisions. A negative result may occur with  improper specimen collection/handling, submission of specimen other than nasopharyngeal swab, presence of viral mutation(s) within the areas targeted by this assay, and inadequate number of viral copies(<138 copies/mL). A negative result must be combined with clinical observations, patient history, and epidemiological information. The expected result is Negative.  Fact Sheet for Patients:  EntrepreneurPulse.com.au  Fact Sheet for Healthcare Providers:  IncredibleEmployment.be  This test is no t yet approved or cleared by the [place] FDA and  has been authorized for detection and/or diagnosis of SARS-CoV-2 by FDA under an Emergency Use Authorization (EUA). This EUA will remain  in effect (meaning this test can be used) for the duration of the COVID-19 declaration under Section 564(b)(1) of the Act, 21 U.S.C.section 360bbb-3(b)(1), unless the authorization is terminated  or revoked sooner.       Influenza A by PCR NEGATIVE NEGATIVE Final   Influenza B by PCR NEGATIVE NEGATIVE Final    Comment: (NOTE) The Xpert Xpress SARS-CoV-2/FLU/RSV plus assay is intended as an aid in the diagnosis of influenza from Nasopharyngeal swab specimens and should not be used as a sole basis for treatment. Nasal washings and aspirates are unacceptable for Xpert Xpress SARS-CoV-2/FLU/RSV testing.  Fact Sheet for Patients: EntrepreneurPulse.com.au  Fact Sheet for Healthcare Providers: IncredibleEmployment.be  This test is not yet approved or cleared by the [place] FDA and has been authorized for detection and/or diagnosis of SARS-CoV-2 by FDA under  an Emergency Use Authorization (EUA). This EUA will remain in effect (meaning this test can be used) for the duration of the COVID-19 declaration under Section 564(b)(1) of the Act, 21 U.S.C. section 360bbb-3(b)(1), unless the authorization is terminated or revoked.  Performed at [facility], [address] [place], [place] [address]     Time coordinating discharge: Over 30 minutes  SIGNED:  Lorella Nimrod, MD  [facility] 09/09/2021, 1:42 PM  If 7PM-7AM, please contact night-coverage [email or web address]  This record has been created using Systems analyst. Errors have been sought and corrected,but may not always be located. Such creation errors do not reflect on the standard of care.

## 2021-09-17 NOTE — Progress Notes (Deleted)
Patient ID: Tyler Ochoa, male    DOB: Jan 30, 1951, 70 y.o.   MRN: [id number]  HPI  Mr Yeakle is a 70 y/o male with a history of hyperlipidemia, HTN, CKD and chronic heart failure.   Echo report from 08/29/21 reviewed and showed an EF of <20% with mild MR. Echo report from 01/07/21 reviewed and showed an EF of 25-30% along with severe LVH and mild MR.   RHC/LHC done 09/08/21 and showed: Multivessel coronary artery disease, as detailed below, including chronic total occlusion of the mid LAD, as well as moderate LCx and RCA disease of up to 70%. Upper normal to mildly elevated left and right heart filling pressures. Mild to moderate pulmonary hypertension. Mildly reduced Fick cardiac output/index.  Admitted 08/28/21 due to acute on chronic HF. Initially given IV lasix, then transitioned to lasix gtt due to renal function and then changed to oral diuretics.    Cardiology & palliative care consults obtained. Cath completed. Diagnosed with liver cirrhosis. Found to have right renal mass. PT evaluation done.         Discharged after 12 days to SNF.    He presents today for a follow-up visit with a chief complaint of   Past Medical History:  Diagnosis Date   CHF (congestive heart failure) (HCC)    Chronic kidney disease    Hyperlipidemia    Hypertension    Peripheral edema     Social History   Tobacco Use   Smoking status: Never   Smokeless tobacco: Never  Substance Use Topics   Alcohol use: Not on file   No Known Allergies    Review of Systems  Constitutional:  Negative for appetite change (decreased) and fatigue.  HENT:  Negative for congestion and postnasal drip.   Eyes: Negative.   Respiratory:  Positive for cough (productive) and shortness of breath. Negative for chest tightness.   Cardiovascular:  Negative for chest pain, palpitations and leg swelling.  Gastrointestinal:  Negative for abdominal distention and abdominal pain.  Endocrine: Negative.   Genitourinary:  Negative.   Musculoskeletal:  Positive for arthralgias (knee pain). Negative for back pain.  Skin: Negative.   Allergic/Immunologic: Negative.   Neurological:  Negative for dizziness and light-headedness.  Hematological:  Negative for adenopathy. Does not bruise/bleed easily.  Psychiatric/Behavioral:  Positive for sleep disturbance (sleeping on 2 pillows). Negative for dysphoric mood. The patient is not nervous/anxious.      Physical Exam Vitals and nursing note reviewed. Exam conducted with a chaperone present (significant other).  Constitutional:      Appearance: Normal appearance.  HENT:     Head: Normocephalic and atraumatic.  Cardiovascular:     Rate and Rhythm: Normal rate and regular rhythm.  Pulmonary:     Effort: Pulmonary effort is normal. No respiratory distress.     Breath sounds: No wheezing or rales.  Abdominal:     General: There is no distension.     Tenderness: There is no abdominal tenderness.  Musculoskeletal:        General: No tenderness.     Cervical back: Normal range of motion and neck supple.     Right lower leg: No edema.     Left lower leg: No edema.  Skin:    General: Skin is warm and dry.  Neurological:     General: No focal deficit present.     Mental Status: He is alert and oriented to person, place, and time.  Psychiatric:  Mood and Affect: Mood normal.        Behavior: Behavior normal.        Thought Content: Thought content normal.    Assessment & Plan:  1: Chronic heart failure with reduced ejection fraction- - NYHA class II - euvolemic today - weighing daily; reminded to call for an overnight weight gain of > 2 pounds or a weekly weight gain of > 5 pounds - weight 232 pounds from last visit here 6 weeks ago - drinking ensure every couple of days - on GDMT of dapagliflozin, metoprolol succinate, entresto  - BNP 08/30/21 was 4339.5  2: HTN- - BP  - has seen PCP at [facility] in the past - BMP 09/09/21 reviewed and  showed sodium 133, potassium 4.1, creatinine 1.32 and GFR 59    Medication list reviewed.

## 2021-09-18 ENCOUNTER — Ambulatory Visit: Payer: Medicare HMO | Admitting: Family

## 2021-09-24 ENCOUNTER — Telehealth: Payer: Self-pay

## 2021-09-24 NOTE — Telephone Encounter (Signed)
Patient's girlfriend, Mordecai Rasmussen, called [place] reporting that the nursing facility the patient is living in is neglecting his care and the family want to bring him home. She requested assistance obtaining a hospital bed, wheelchair, portable toilet and home health help in order to take the patient out of nursing facility where he currently resides.  We called patient's PCP, Dr Chipper Herb, to relay the situation and try to schedule the patient an appt with PCP as this clinic is unable to resolve the patient's requests.  PCP acknowledged, and stated the patient would need to be seen in office to determine the patient's needs and that patient's girlfriend needs to call and schedule the patient's appt herself.  Updated Mordecai Rasmussen with PCP phone number and instructed her to call and schedule an appt with Dr. Chipper Herb and that they stated they will see the patient as soon as possible.  Suanne Marker, RN [facility]

## 2021-09-28 ENCOUNTER — Ambulatory Visit: Payer: Medicare HMO | Admitting: Family

## 2021-09-28 NOTE — Progress Notes (Signed)
Cardiology Office Note    Date:  09/29/2021   ID:  RENEE WALKENHORST, DOB 1950-12-01, MRN [id number]  PCP:  Manson Allan, MD  Cardiologist:  Kate Sable, MD  Electrophysiologist:  None   Chief Complaint: Hospital follow-up  History of Present Illness:   Tyler Ochoa is a 70 y.o. male with history of CAD, HFrEF secondary to ICM dating back to at least 01/2021, alcohol use with cirrhosis and ascites, CKD stage II-III, HTN, HLD, renal mass, pulmonary nodule, and osteoarthritis who presents for hospital follow-up as outlined below.  He underwent outpatient echo, as ordered by his PCP on 01/07/2021 which demonstrated an EF of 25 to 30%, global hypokinesis, severe concentric LVH, indeterminate LV diastolic function parameters, severely reduced RV systolic function with a mildly enlarged RV cavity size and mildly increased RV wall thickness, severe biatrial enlargement, small pericardial effusion noted posterior and lateral to the left ventricle, mild mitral regurgitation, and a mildly dilated aortic root measuring 41 mm.  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  Following this, he was admitted to the hospital in late 01/2021 with acute on chronic HFrEF with possible component of cardiorenal syndrome.  He was placed on IV dopamine and IV Lasix as directed by outside cardiology group.  Further work-up of his cardiomyopathy was not pursued at that time.  He was admitted to [facility] from 10/21 through 11/2 with acute on chronic HFrEF and anasarca complicated by demand ischemia, liver cirrhosis noted on CT abdomen/pelvis, transaminitis, hypoalbuminemia, AKI, a right renal mass, pulmonary nodule, and stage II pressure injury on the buttocks.  BNP 4339.  Initial and peak high-sensitivity troponin 420.  Echo demonstrated an EF of less than 20%, global hypokinesis, mild concentric LVH, grade 3 diastolic dysfunction,  moderately reduced RV systolic function with mildly enlarged RV cavity size, mildly dilated left atrium, moderately dilated right atrium, mild mitral regurgitation, and an estimated right atrial pressure of 15 mmHg.  Initially, the patient was consulted on by outside cardiology group with no plans for further work-up.  Given this, the patient requested to switch his cardiology group to [facility].  He was IV diuresed with symptomatic improvement.  R/LHC on 09/08/2021 showed multivessel CAD including CTO of the mid LAD as well as moderate LCx and RCA disease of up to 70%.  Upper normal to mildly elevated left and right heart filling pressures, mild to moderate pulmonary hypertension, and mildly reduced cardiac output/index.  It was recommended to optimize the patient from a heart failure perspective followed by consideration for viability study to determine if coronary revascularization may be beneficial.  Hypotension precluded escalation of GDMT, and prior to discharge Toprol-XL was discontinued.  From a cardiac perspective, discharge medications included aspirin 81 mg, atorvastatin 80 mg, Farxiga 10 mg, furosemide 40 mg daily, KCl 20 mEq daily.  He comes in accompanied by his girlfriend and a nurse from his living facility.  He is overall doing reasonably well from a cardiac perspective.  No angina.  He notes stable mild dyspnea with overexertion.  No lower extremity swelling, abdominal distention, orthopnea, PND, early satiety.  He does not feel like he is holding onto fluid.  He is too weak to stand for a weight in the office today.  He is tolerating the above cardiac medications without issues.  He is watching his salt intake.  No further alcohol use.  No presyncope, or syncope.  Labs independently reviewed: 09/2021 -  potassium 4.1, BUN 47, serum creatinine 1.32, albumin 2.1, AST 145, ALT normal, Hgb 16.1, PLT 66, INR 1.4, TSH normal 08/2021 -magnesium 1.9 02/2021 - A1c 6.6  Past Medical History:   Diagnosis Date   CHF (congestive heart failure) ([place])    Chronic kidney disease    Hyperlipidemia    Hypertension    Peripheral edema     Past Surgical History:  Procedure Laterality Date   RIGHT/LEFT HEART CATH AND CORONARY ANGIOGRAPHY N/A 09/08/2021   Procedure: RIGHT/LEFT HEART CATH AND CORONARY ANGIOGRAPHY;  Surgeon: Nelva Bush, MD;  Location: [place] [facility];  Service: Cardiovascular;  Laterality: N/A;    Current Medications: Current Meds  Medication Sig   acetaminophen (TYLENOL) 500 MG tablet Take 500-1,000 mg by mouth every 6 (six) hours as needed for mild pain or moderate pain.   aspirin EC 81 MG tablet Take 1 tablet (81 mg total) by mouth daily. Swallow whole.   atorvastatin (LIPITOR) 80 MG tablet Take 1 tablet (80 mg total) by mouth at bedtime.   dapagliflozin propanediol (FARXIGA) 10 MG TABS tablet Take 1 tablet (10 mg total) by mouth daily before breakfast.   feeding supplement (ENSURE ENLIVE / ENSURE PLUS) LIQD Take 237 mLs by mouth 3 (three) times daily between meals.   furosemide (LASIX) 40 MG tablet Take 1 tablet (40 mg total) by mouth daily.   magnesium hydroxide (MILK OF MAGNESIA) 400 MG/5ML suspension Take 30 mLs by mouth daily as needed for mild constipation.   potassium chloride SA (KLOR-CON) 20 MEQ tablet Take 1 tablet (20 mEq total) by mouth daily.   PROAIR HFA 108 (90 Base) MCG/ACT inhaler Inhale 1-2 puffs into the lungs every 6 (six) hours as needed for wheezing.    Allergies:   Patient has no known allergies.   Social History   Socioeconomic History   Marital status: Single    Spouse name: Not on file   Number of children: Not on file   Years of education: Not on file   Highest education level: Not on file  Occupational History   Not on file  Tobacco Use   Smoking status: Never   Smokeless tobacco: Never  Vaping Use   Vaping Use: Never used  Substance and Sexual Activity   Alcohol use: Not on file   Drug use: Not on file   Sexual  activity: Not on file  Other Topics Concern   Not on file  Social History Narrative   ** Merged History Encounter **       Social Determinants of Health   Financial Resource Strain: Not on file  Food Insecurity: Not on file  Transportation Needs: Not on file  Physical Activity: Not on file  Stress: Not on file  Social Connections: Not on file     Family History:  The patient's family history is not on file.  ROS:   Review of Systems  Constitutional:  Positive for malaise/fatigue. Negative for chills, diaphoresis, fever and weight loss.  HENT:  Negative for congestion.   Eyes:  Negative for discharge and redness.  Respiratory:  Negative for cough, sputum production, shortness of breath and wheezing.   Cardiovascular:  Negative for chest pain, palpitations, orthopnea, claudication, leg swelling and PND.  Gastrointestinal:  Negative for abdominal pain, heartburn, nausea and vomiting.  Musculoskeletal:  Negative for falls and myalgias.  Skin:  Negative for rash.  Neurological:  Positive for weakness. Negative for dizziness, tingling, tremors, sensory change, speech change, focal weakness and loss  of consciousness.  Endo/Heme/Allergies:  Does not bruise/bleed easily.  Psychiatric/Behavioral:  Negative for substance abuse. The patient is not nervous/anxious.   All other systems reviewed and are negative.   EKGs/Labs/Other Studies Reviewed:    Studies reviewed were summarized above. The additional studies were reviewed today:  [facility] 09/08/2021: Conclusions: Multivessel coronary artery disease, as detailed below, including chronic total occlusion of the mid LAD, as well as moderate LCx and RCA disease of up to 70%. Upper normal to mildly elevated left and right heart filling pressures. Mild to moderate pulmonary hypertension. Mildly reduced Fick cardiac output/index.   Recommendations: Discontinue furosemide infusion; anticipate transitioning to oral diuretic regimen  tomorrow. Escalate goal-directed medical therapy as tolerated. Once optimized from heart failure standpoint, consider viability study to determine if coronary revascularization may be beneficial. Secondary prevention of coronary artery disease. __________  2D echo 08/29/2021: 1. Left ventricular ejection fraction, by estimation, is <20%. The left  ventricle has severely decreased function. The left ventricle demonstrates  global hypokinesis. There is mild concentric left ventricular hypertrophy.  Left ventricular diastolic  parameters are consistent with Grade III diastolic dysfunction  (restrictive).   2. Right ventricular systolic function is moderately reduced. The right  ventricular size is mildly enlarged.   3. Left atrial size was mildly dilated.   4. Right atrial size was moderately dilated.   5. The mitral valve is normal in structure. Mild mitral valve  regurgitation.   6. The aortic valve is normal in structure. Aortic valve regurgitation is  not visualized. No aortic stenosis is present.   7. The inferior vena cava is dilated in size with <50% respiratory  variability, suggesting right atrial pressure of 15 mmHg. __________  2D echo 01/07/2021: 1. Left ventricular ejection fraction, by estimation, is 25 to 30%. The  left ventricle has severely decreased function. The left ventricle  demonstrates global hypokinesis. There is severe concentric left  ventricular hypertrophy. Left ventricular  diastolic parameters are indeterminate.   2. Right ventricular systolic function is severely reduced. The right  ventricular size is mildly enlarged. Mildly increased right ventricular  wall thickness.   3. Left atrial size was severely dilated.   4. Right atrial size was severely dilated.   5. A small pericardial effusion is present. The pericardial effusion is  posterior and lateral to the left ventricle.   6. The mitral valve is normal in structure. Mild mitral valve   regurgitation.   7. The aortic valve is tricuspid. Aortic valve regurgitation is not  visualized.   8. Aortic dilatation noted. There is mild dilatation of the aortic root,  measuring 41 mm.   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 consider CMR.     EKG:  EKG is ordered today.  The EKG ordered today demonstrates sinus tachycardia, 105 bpm, rare PVC, poor R wave progression along the precordial leads, possible prior anterior infarct, nonspecific ST-T changes  Recent Labs: 08/30/2021: B Natriuretic Peptide 4,339.5 09/07/2021: Magnesium 1.9 09/08/2021: TSH 2.864 09/09/2021: ALT 43; BUN 47; Creatinine, Ser 1.32; Hemoglobin 16.1; Platelets 66; Potassium 4.1; Sodium 133  Recent Lipid Panel No results found for: CHOL, TRIG, HDL, CHOLHDL, VLDL, LDLCALC, LDLDIRECT  PHYSICAL EXAM:    VS:  BP 102/66 (BP Location: Right Arm, Patient Position: Sitting, Cuff Size: Normal)   Pulse (!) 105   Ht 6\' 2"  (1.88 m)   SpO2 96%   BMI 27.15 kg/m  BMI: Body mass index is 27.15 kg/m.  Physical Exam Vitals reviewed.  Constitutional:      Appearance: He is well-developed.  HENT:     Head: Normocephalic and atraumatic.  Eyes:     General:        Right eye: No discharge.        Left eye: No discharge.  Neck:     Vascular: No JVD.  Cardiovascular:     Rate and Rhythm: Normal rate and regular rhythm.     Heart sounds: Normal heart sounds, S1 normal and S2 normal. Heart sounds not distant. No midsystolic click and no opening snap. No murmur heard.   No friction rub.  Pulmonary:     Effort: Pulmonary effort is normal. No respiratory distress.     Breath sounds: Normal breath sounds. No decreased breath sounds, wheezing or rales.  Chest:     Chest wall: No tenderness.  Abdominal:     General: There is no distension.     Palpations:  Abdomen is soft.     Tenderness: There is no abdominal tenderness.  Musculoskeletal:     Cervical back: Normal range of motion.     Right lower leg: Edema present.     Left lower leg: Edema present.     Comments: Trace bilateral ankle edema  Skin:    General: Skin is warm and dry.     Nails: There is no clubbing.  Neurological:     Mental Status: He is alert and oriented to person, place, and time.  Psychiatric:        Speech: Speech normal.        Behavior: Behavior normal.        Thought Content: Thought content normal.        Judgment: Judgment normal.    Wt Readings from Last 3 Encounters:  09/09/21 211 lb 8 oz (95.9 kg)  08/03/21 232 lb (105.2 kg)  05/05/21 217 lb 8 oz (98.7 kg)     ASSESSMENT & PLAN:   CAD involving the native coronary arteries without angina: He is doing well without symptoms suggestive of chest pain.  LHC demonstrated multivessel CAD as outlined above.  Pursue cardiac MRI to evaluate for viability.  Continue ASA 81 mg and atorvastatin 80 mg.  Post-cath instructions.  Aggressive risk factor modification.  HFrEF secondary to ICM: He appears euvolemic and well compensated.  Patient unable to stand for weight in the office today.  Relative hypotension has precluded escalation of GDMT.  Plan for cardiac MRI to evaluate for possible infiltrative cardiomyopathy as noted on echo in 01/2021, and as outlined above to assess myocardial viability with recommendation to pursue revascularization if indicated.  Following optimization of medical therapy, and potential intervention, would recommend repeat limited echo to reassess LV systolic function.  If his cardiomyopathy persists at that time with an LVEF of less than 35% would recommend referral to EP as well as to the advanced heart failure service.  Continue Farxiga, Lasix, and KCl.  Evidence-based medical therapy options are limited.  Unable to escalate GDMT with addition of beta-blocker, ACE inhibitor/ARB/ARNI secondary to  relative hypotension.  Defer addition of MRA secondary to underlying CKD.  With underlying cirrhosis, Corlanor appears to be contraindicated.  CHF education.  Cirrhosis with ascites and hypoalbuminemia: No further alcohol use.  Follow-up with PCP.  May benefit from referral to GI/hepatology.  CKD stage II-III: Stable on hospital discharge.  Follow-up with PCP.  HTN: Blood pressure is soft, though  stable in the office today.  Relative hypotension precludes escalation of GDMT as outlined above.  HLD: No recent lipid panel available for review.  Check LFT and lipid panel.  Currently on atorvastatin 80 mg.  Incidental findings noted on CT including renal mass and pulmonary nodule: Patient was notified of these findings in the office today and has been advised to follow-up with his PCP for further imaging as recommended by radiology.   Disposition: F/u with Dr. Azucena Cecil or an APP after cardiac MRI.   Medication Adjustments/Labs and Tests Ordered: Current medicines are reviewed at length with the patient today.  Concerns regarding medicines are outlined above. Medication changes, Labs and Tests ordered today are summarized above and listed in the Patient Instructions accessible in Encounters.   Signed, Eula Listen, PA-C 09/29/2021 9:44 AM     [facility] [address] [place], [place] [address] [phone]

## 2021-09-29 ENCOUNTER — Other Ambulatory Visit: Payer: Self-pay

## 2021-09-29 ENCOUNTER — Encounter: Payer: Self-pay | Admitting: Physician Assistant

## 2021-09-29 ENCOUNTER — Ambulatory Visit: Payer: Medicare HMO | Admitting: Physician Assistant

## 2021-09-29 VITALS — BP 102/66 | HR 105 | Ht 74.0 in

## 2021-09-29 DIAGNOSIS — I2510 Atherosclerotic heart disease of native coronary artery without angina pectoris: Secondary | ICD-10-CM | POA: Diagnosis not present

## 2021-09-29 DIAGNOSIS — I255 Ischemic cardiomyopathy: Secondary | ICD-10-CM

## 2021-09-29 DIAGNOSIS — I5020 Unspecified systolic (congestive) heart failure: Secondary | ICD-10-CM

## 2021-09-29 DIAGNOSIS — R9389 Abnormal findings on diagnostic imaging of other specified body structures: Secondary | ICD-10-CM

## 2021-09-29 DIAGNOSIS — R188 Other ascites: Secondary | ICD-10-CM

## 2021-09-29 DIAGNOSIS — K7460 Unspecified cirrhosis of liver: Secondary | ICD-10-CM | POA: Diagnosis not present

## 2021-09-29 DIAGNOSIS — E785 Hyperlipidemia, unspecified: Secondary | ICD-10-CM

## 2021-09-29 DIAGNOSIS — I10 Essential (primary) hypertension: Secondary | ICD-10-CM

## 2021-09-29 DIAGNOSIS — N182 Chronic kidney disease, stage 2 (mild): Secondary | ICD-10-CM

## 2021-09-29 NOTE — Patient Instructions (Signed)
Medication Instructions:  No changes at this time.   *If you need a refill on your cardiac medications before your next appointment, please call your pharmacy*   Lab Work: CMP, Lipid, Direct LDL today  If you have labs (blood work) drawn today and your tests are completely normal, you will receive your results only by: MyChart Message (if you have MyChart) OR A paper copy in the mail If you have any lab test that is abnormal or we need to change your treatment, we will call you to review the results.   Testing/Procedures: You are scheduled for Cardiac MRI on November 03, 2021*. Please arrive at the [facility] main entrance of [facility] at 1:30 pm (30-45 minutes prior to test start time). ?  [facility]  [address]  [place], [place] [address]  [phone]  Proceed to the [facility] Radiology Department (First Floor).  ?  Magnetic resonance imaging (MRI) is a painless test that produces images of the inside of the body without using X-rays. During an MRI, strong magnets and radio waves work together in a Data processing manager to form detailed images. MRI images may provide more details about a medical condition than X-rays, CT scans, and ultrasounds can provide.   You may be given earphones to listen for instructions.   HOLD Furosemide and Potassium the day of this test.   You may eat a light breakfast and take all other medications as ordered. If a contrast material will be used, an IV will be inserted into one of your veins. Contrast material will be injected into your IV.   You will be asked to remove all metal, including: Watch, jewelry, and other metal objects including hearing aids, hair pieces and dentures. (Braces and fillings normally are not a problem.)   If contrast material was used:  It will leave your body through your urine within a day. You may be told to drink plenty of fluids to help flush the contrast material out of your system.  TEST WILL  TAKE APPROXIMATELY 1 HOUR  PLEASE NOTIFY SCHEDULING AT LEAST 24 HOURS IN ADVANCE IF YOU ARE UNABLE TO KEEP YOUR APPOINTMENT.      Follow-Up: At [facility], you and your health needs are our priority.  As part of our continuing mission to provide you with exceptional heart care, we have created designated Provider Care Teams.  These Care Teams include your primary Cardiologist (physician) and Advanced Practice Providers (APPs -  Physician Assistants and Nurse Practitioners) who all work together to provide you with the care you need, when you need it.   Your next appointment:   After your cardiac MRI has been done  The format for your next appointment:   In Person  Provider:   Debbe Odea, MD or Eula Listen, PA-C

## 2021-09-30 ENCOUNTER — Telehealth: Payer: Self-pay | Admitting: Physician Assistant

## 2021-09-30 LAB — COMPREHENSIVE METABOLIC PANEL
ALT: 90 IU/L — ABNORMAL HIGH (ref 0–44)
AST: 152 IU/L — ABNORMAL HIGH (ref 0–40)
Albumin/Globulin Ratio: 0.8 — ABNORMAL LOW (ref 1.2–2.2)
Albumin: 3.1 g/dL — ABNORMAL LOW (ref 3.8–4.8)
Alkaline Phosphatase: 206 IU/L — ABNORMAL HIGH (ref 44–121)
BUN/Creatinine Ratio: 28 — ABNORMAL HIGH (ref 10–24)
BUN: 25 mg/dL (ref 8–27)
Bilirubin Total: 2.1 mg/dL — ABNORMAL HIGH (ref 0.0–1.2)
CO2: 20 mmol/L (ref 20–29)
Calcium: 9 mg/dL (ref 8.6–10.2)
Chloride: 97 mmol/L (ref 96–106)
Creatinine, Ser: 0.88 mg/dL (ref 0.76–1.27)
Globulin, Total: 3.8 g/dL (ref 1.5–4.5)
Glucose: 102 mg/dL — ABNORMAL HIGH (ref 70–99)
Potassium: 4.7 mmol/L (ref 3.5–5.2)
Sodium: 135 mmol/L (ref 134–144)
Total Protein: 6.9 g/dL (ref 6.0–8.5)
eGFR: 94 mL/min/{1.73_m2} (ref >59)

## 2021-09-30 LAB — LIPID PANEL
Chol/HDL Ratio: 4.7 ratio (ref 0.0–5.0)
Cholesterol, Total: 160 mg/dL (ref 100–199)
HDL: 34 mg/dL — ABNORMAL LOW (ref >39)
LDL Chol Calc (NIH): 112 mg/dL — ABNORMAL HIGH (ref 0–99)
Triglycerides: 69 mg/dL (ref 0–149)
VLDL Cholesterol Cal: 14 mg/dL (ref 5–40)

## 2021-09-30 LAB — LDL CHOLESTEROL, DIRECT: LDL Direct: 104 mg/dL — ABNORMAL HIGH (ref 0–99)

## 2021-09-30 NOTE — Telephone Encounter (Signed)
2nd attempt to call the patient regarding his lab results and provider recommendations. No answer- voice mail box is full.

## 2021-09-30 NOTE — Telephone Encounter (Signed)
Attempted to call the patient. No answer- voice mail box is full.   Will attempt to call the patient back later today.

## 2021-09-30 NOTE — Telephone Encounter (Signed)
Sondra Barges, PA-C  09/30/2021  9:59 AM EST     Random glucose is ok Renal function is normal Potassium at goal Liver function remains abnormal and looks a little worse, less likely congestive hepatopathy at this time as he was euvolemic yesterday in the office -LDL is elevated, though will need to stop Lipitor given abnormal LFT   Recommendations: -Follow up with PCP and GI for ongoing cirrhosis (can place a referral to GI if needed) -Stop Lipitor with noted worsening liver function

## 2021-10-02 ENCOUNTER — Encounter (HOSPITAL_COMMUNITY): Payer: Self-pay | Admitting: Radiology

## 2021-10-05 NOTE — Telephone Encounter (Signed)
Spoke with patient and reviewed his results and recommendations. Instructed him to stop his atorvastatin (Lipitor) and to follow up with his primary care provider and GI doctor. He reports upcoming appointment on 10/07/21. He verbalized understanding of our conversation, agreement with plan, and has no further questions at this time.

## 2021-10-30 ENCOUNTER — Telehealth (HOSPITAL_COMMUNITY): Payer: Self-pay | Admitting: *Deleted

## 2021-10-30 NOTE — Telephone Encounter (Signed)
Reaching out to patient to offer assistance regarding upcoming cardiac imaging study; pt verbalizes understanding of appt date/time, parking situation and where to check in, and verified current allergies; name and call back number provided for further questions should they arise  Kendelle Schweers RN Navigator Cardiac Imaging [facility] [phone] office [phone] cell  Patient denies metal or claustrophobia. 

## 2021-11-03 ENCOUNTER — Other Ambulatory Visit: Payer: Self-pay

## 2021-11-03 ENCOUNTER — Ambulatory Visit (HOSPITAL_COMMUNITY)
Admission: RE | Admit: 2021-11-03 | Discharge: 2021-11-03 | Disposition: A | Discharge status: Home / Self Care | Payer: Medicare HMO | Source: Ambulatory Visit | Attending: Physician Assistant | Admitting: Physician Assistant

## 2021-11-03 DIAGNOSIS — I255 Ischemic cardiomyopathy: Secondary | ICD-10-CM | POA: Diagnosis present

## 2021-11-03 DIAGNOSIS — I5020 Unspecified systolic (congestive) heart failure: Secondary | ICD-10-CM | POA: Insufficient documentation

## 2021-11-03 DIAGNOSIS — K7460 Unspecified cirrhosis of liver: Secondary | ICD-10-CM | POA: Diagnosis present

## 2021-11-03 DIAGNOSIS — R9389 Abnormal findings on diagnostic imaging of other specified body structures: Secondary | ICD-10-CM | POA: Diagnosis present

## 2021-11-03 DIAGNOSIS — N182 Chronic kidney disease, stage 2 (mild): Secondary | ICD-10-CM | POA: Insufficient documentation

## 2021-11-03 DIAGNOSIS — R188 Other ascites: Secondary | ICD-10-CM | POA: Insufficient documentation

## 2021-11-03 DIAGNOSIS — I10 Essential (primary) hypertension: Secondary | ICD-10-CM | POA: Insufficient documentation

## 2021-11-03 DIAGNOSIS — I2510 Atherosclerotic heart disease of native coronary artery without angina pectoris: Secondary | ICD-10-CM | POA: Insufficient documentation

## 2021-11-03 DIAGNOSIS — E785 Hyperlipidemia, unspecified: Secondary | ICD-10-CM | POA: Diagnosis present

## 2021-11-03 MED ORDER — GADOBUTROL 1 MMOL/ML IV SOLN
10.0000 mL | Freq: Once | INTRAVENOUS | Status: AC | PRN
  Administered 2021-11-03: 15:00:00 10 mL via INTRAVENOUS

## 2021-11-04 ENCOUNTER — Telehealth: Payer: Self-pay

## 2021-11-04 DIAGNOSIS — E854 Organ-limited amyloidosis: Secondary | ICD-10-CM

## 2021-11-04 NOTE — Progress Notes (Signed)
Patient ID: Tyler Ochoa, male    DOB: 03/21/1951, 70 y.o.   MRN: [id number]  HPI  Tyler Ochoa is a 70 y/o male with a history of hyperlipidemia, HTN, CKD and chronic heart failure.   Echo report from 08/29/21 reviewed and showed an EF of <20% along with mild LVH and mild Tyler. Echo report from 01/07/21 reviewed and showed an EF of 25-30% along with severe LVH and mild Tyler.   RHC/LHC done 09/08/21 and showed: Multivessel coronary artery disease, as detailed below, including chronic total occlusion of the mid LAD, as well as moderate LCx and RCA disease of up to 70%. Upper normal to mildly elevated left and right heart filling pressures. Mild to moderate pulmonary hypertension. Mildly reduced Fick cardiac output/index.  Cardiac MRI done 11/03/21: IMPRESSION: 1.  Moderate right, small left pleural effusions. 2.  Small, primarily inferior pericardial effusion. 3. Mildly dilated LV with moderate concentric LVH, global severe hypokinesis with EF 12%. 4.  Moderately dilated RV with severe systolic dysfunction, EF 18%. 5. Very extensive LGE throughout the left ventricular myocardium and patchy LGE in the RV, this appears most consistent with cardiac amyloidosis. 6. Elevated T1 and ECV percentage, this is most consistent with cardiac amyloidosis.  Admitted 08/28/21 due to weakness, dry cough, SOB and edema due to acute on chronic HF, possible NSTEMI and AKI. BNP and troponin both elevated. Initially given IV lasix lasix with subssequent transition to oral diuretics. Started on heparin infusion. Palliative care and cardiology consult obtained. RHC/LHC done. Liver cirrhosis noted on abdominal/pelvic CT. Right renal mass noted also on CT. Oral medications adjusted. PT evaluation done. Discharged after 12 days.   Tyler Ochoa presents today for a follow-up visit with a chief complaint of  moderate fatigue with little exertion. Tyler Ochoa describes this as chronic in nature having been present for several years. Tyler Ochoa has  associated cough, shortness of breath, rare palpitations, light-headedness, weakness and difficulty sleeping along with this. Tyler Ochoa denies any chest pain, pedal edema or abdominal distention.   Tyler Ochoa says that Tyler Ochoa has upcoming appointment in [place] to discuss his recent cardiac MRI and possible treatment of amyloidosis.   Past Medical History:  Diagnosis Date   CHF (congestive heart failure) (HCC)    Chronic kidney disease    Hyperlipidemia    Hypertension    Peripheral edema     Social History   Tobacco Use   Smoking status: Never   Smokeless tobacco: Never  Substance Use Topics   Alcohol use: Not on file   No Known Allergies   Prior to Admission medications   Medication Sig Start Date End Date Taking? Authorizing Provider  acetaminophen (TYLENOL) 500 MG tablet Take 500-1,000 mg by mouth every 6 (six) hours as needed for mild pain or moderate pain.   Yes [provider]  aspirin EC 81 MG tablet Take 1 tablet (81 mg total) by mouth daily. Swallow whole. 04/13/21  Yes Clarisa Kindred A, FNP  dapagliflozin propanediol (FARXIGA) 10 MG TABS tablet Take 1 tablet (10 mg total) by mouth daily before breakfast. 05/01/21  Yes Gollan, Tollie Pizza, MD  furosemide (LASIX) 40 MG tablet Take 1 tablet (40 mg total) by mouth daily. 09/09/21  Yes Arnetha Courser, MD  potassium chloride SA (KLOR-CON) 20 MEQ tablet Take 1 tablet (20 mEq total) by mouth daily. 04/13/21  Yes Yanni Ruberg A, FNP  PROAIR HFA 108 (90 Base) MCG/ACT inhaler Inhale 1-2 puffs into the lungs every 6 (six) hours as needed for  wheezing.   Yes [provider]  sacubitril-valsartan (ENTRESTO) 24-26 MG Take 1 tablet by mouth 2 (two) times daily.   Yes [provider]  feeding supplement (ENSURE ENLIVE / ENSURE PLUS) LIQD Take 237 mLs by mouth 3 (three) times daily between meals. Patient not taking: Reported on 11/05/2021 09/09/21   Arnetha Courser, MD  magnesium hydroxide (MILK OF MAGNESIA) 400 MG/5ML suspension Take 30 mLs by  mouth daily as needed for mild constipation. Patient not taking: Reported on 11/05/2021 09/09/21   Arnetha Courser, MD   Review of Systems  Constitutional:  Positive for fatigue (easily). Negative for appetite change.  HENT:  Negative for congestion, postnasal drip and sore throat.   Eyes: Negative.   Respiratory:  Positive for cough (productive) and shortness of breath. Negative for chest tightness.   Cardiovascular:  Positive for palpitations (rarely). Negative for chest pain and leg swelling.  Gastrointestinal:  Negative for abdominal distention and abdominal pain.  Endocrine: Negative.   Genitourinary: Negative.   Musculoskeletal:  Positive for arthralgias (knee pain). Negative for back pain.  Skin: Negative.   Allergic/Immunologic: Negative.   Neurological:  Positive for weakness (PT once a week) and light-headedness (on occasion). Negative for dizziness.  Hematological:  Negative for adenopathy. Does not bruise/bleed easily.  Psychiatric/Behavioral:  Positive for sleep disturbance (sleeping on 2 pillows in hospital bed with HOB elevated). Negative for dysphoric mood. The patient is not nervous/anxious.    Vitals:   11/05/21 1339 11/05/21 1344  BP: (!) 117/92   Pulse: (!) 109   Resp: 18   SpO2: 97%   Weight:  236 lb 4 oz (107.2 kg)  Height: 6\' 2"  (1.88 m) 6\' 2"  (1.88 m)   Wt Readings from Last 3 Encounters:  11/05/21 236 lb 4 oz (107.2 kg)  09/09/21 211 lb 8 oz (95.9 kg)  08/03/21 232 lb (105.2 kg)   Lab Results  Component Value Date   CREATININE 0.88 09/29/2021   CREATININE 1.32 (H) 09/09/2021   CREATININE 1.24 09/08/2021   Physical Exam Vitals and nursing note reviewed. Exam conducted with a chaperone present (significant other).  Constitutional:      Appearance: Normal appearance.  HENT:     Head: Normocephalic and atraumatic.  Cardiovascular:     Rate and Rhythm: Regular rhythm. Tachycardia present.  Pulmonary:     Effort: Pulmonary effort is normal. No  respiratory distress.     Breath sounds: No wheezing or rales.  Abdominal:     General: There is no distension.     Palpations: Abdomen is soft.     Tenderness: There is no abdominal tenderness.  Musculoskeletal:        General: No tenderness.     Cervical back: Normal range of motion and neck supple.     Right lower leg: Edema (1+ pitting) present.     Left lower leg: No tenderness. Edema (1+ pitting) present.  Skin:    General: Skin is warm and dry.  Neurological:     General: No focal deficit present.     Mental Status: Tyler Ochoa is alert and oriented to person, place, and time.  Psychiatric:        Mood and Affect: Mood normal.        Behavior: Behavior normal.        Thought Content: Thought content normal.    Assessment & Plan:  1: Chronic heart failure with reduced ejection fraction- - NYHA class III - fluid overloaded today with pedal edema -  advised to take an extra furosemide/potassium twice a week - weighing daily; reminded to call for an overnight weight gain of > 2 pounds or a weekly weight gain of > 5 pounds - weight up 4 pounds from last visit here 3 months ago - drinking ensure every couple of days - on GDMT of dapagliflozin & entresto; says that Tyler Ochoa occasionally took both of his entresto at the same time because Tyler Ochoa tended to forget the 2nd dose; explained the importance of needing to take it as 1 tablet BID - relative hypotension has limited GDMT - saw cardiology ([place]) 09/29/21; returns 11/23/20 - BNP 08/30/21 was 4339.5  2: HTN- - BP ok today (117/92) - has seen PCP at [facility] in the past - BMP 09/29/21 reviewed and showed sodium 135, potassium 4.7, creatinine 0.88 and GFR 94  3: Cardiac amyloidosis- - recent cardiac MRI done 11/03/21; EF 12% no thrombus - has appointment with Dr. Gala Romney on 1/13/2   Medication bottles reviewed.   Will not make a return appointment at this time as Tyler Ochoa will be going to the [facility] in [place]. Advised him that once Tyler Ochoa  finishes in [place], to contact us and we can make another appointment at that time. Patient was comfortable with this plan.

## 2021-11-04 NOTE — Telephone Encounter (Signed)
-----   Message from Sondra Barges, [place] sent at 11/04/2021 10:49 AM EST ----- Cardiac MRI showed evidence consistent with cardiac amyloidosis and some volume overload. He should continue Lasix and [place]. Regarding the latter, there have not been clinical trials with SGLT2 inhibitors in cardiac amyloid, but some studies have demonstrated tolerability. Please refer him to Dr. Gala Romney for evaluation and management of cardiac amyloidosis, with appointment as soon as possible. Case has been discussed with the patient's primary cardiologist. He should keep his follow up with [place] next month, unless he can be seen by the advanced heart failure clinic sooner. I can discuss this diagnosis/prognosis in more detail at his follow up. If he has not already done so, he also needs to follow up with his PCP as we discussed at his visit for further evaluation of abnormal imaging findings noted on his CT imaging during his admission.

## 2021-11-04 NOTE — Telephone Encounter (Signed)
Called patient and discussed the result note with him. I had patient write down instructions and diagnosis. He stated that he does not have transportation very easily to get to [place]. I emphasized the importance of this appointment. I will send application for [facility] [employer] in for patient after he comes into office and signs forms. He stated that he would try and come in tomorrow.

## 2021-11-05 ENCOUNTER — Other Ambulatory Visit: Payer: Self-pay

## 2021-11-05 ENCOUNTER — Encounter: Payer: Self-pay | Admitting: Family

## 2021-11-05 ENCOUNTER — Ambulatory Visit: Payer: Medicare HMO | Attending: Family | Admitting: Family

## 2021-11-05 VITALS — BP 117/92 | HR 109 | Resp 18 | Ht 74.0 in | Wt 236.2 lb

## 2021-11-05 DIAGNOSIS — J90 Pleural effusion, not elsewhere classified: Secondary | ICD-10-CM | POA: Insufficient documentation

## 2021-11-05 DIAGNOSIS — I3139 Other pericardial effusion (noninflammatory): Secondary | ICD-10-CM | POA: Insufficient documentation

## 2021-11-05 DIAGNOSIS — E854 Organ-limited amyloidosis: Secondary | ICD-10-CM | POA: Diagnosis not present

## 2021-11-05 DIAGNOSIS — I272 Pulmonary hypertension, unspecified: Secondary | ICD-10-CM | POA: Insufficient documentation

## 2021-11-05 DIAGNOSIS — I959 Hypotension, unspecified: Secondary | ICD-10-CM | POA: Insufficient documentation

## 2021-11-05 DIAGNOSIS — Z7984 Long term (current) use of oral hypoglycemic drugs: Secondary | ICD-10-CM | POA: Diagnosis not present

## 2021-11-05 DIAGNOSIS — N2889 Other specified disorders of kidney and ureter: Secondary | ICD-10-CM | POA: Diagnosis not present

## 2021-11-05 DIAGNOSIS — I43 Cardiomyopathy in diseases classified elsewhere: Secondary | ICD-10-CM | POA: Diagnosis not present

## 2021-11-05 DIAGNOSIS — N189 Chronic kidney disease, unspecified: Secondary | ICD-10-CM | POA: Insufficient documentation

## 2021-11-05 DIAGNOSIS — I130 Hypertensive heart and chronic kidney disease with heart failure and stage 1 through stage 4 chronic kidney disease, or unspecified chronic kidney disease: Secondary | ICD-10-CM | POA: Insufficient documentation

## 2021-11-05 DIAGNOSIS — I2510 Atherosclerotic heart disease of native coronary artery without angina pectoris: Secondary | ICD-10-CM | POA: Diagnosis not present

## 2021-11-05 DIAGNOSIS — I5022 Chronic systolic (congestive) heart failure: Secondary | ICD-10-CM | POA: Diagnosis not present

## 2021-11-05 DIAGNOSIS — E785 Hyperlipidemia, unspecified: Secondary | ICD-10-CM | POA: Diagnosis not present

## 2021-11-05 DIAGNOSIS — K7460 Unspecified cirrhosis of liver: Secondary | ICD-10-CM | POA: Insufficient documentation

## 2021-11-05 DIAGNOSIS — Z79899 Other long term (current) drug therapy: Secondary | ICD-10-CM | POA: Diagnosis not present

## 2021-11-05 DIAGNOSIS — I10 Essential (primary) hypertension: Secondary | ICD-10-CM | POA: Diagnosis not present

## 2021-11-05 MED ORDER — FUROSEMIDE 40 MG PO TABS: 40.0000 mg | ORAL_TABLET | Freq: Every day | ORAL | 5 refills | Status: DC

## 2021-11-05 NOTE — Patient Instructions (Addendum)
Resume weighing daily and call for an overnight weight gain of 3 pounds or more or a weekly weight gain of more than 5 pounds.    Take your entresto as 1 tablet in the morning and 1 tablet in the evening.    Twice a week, take an additional furosemide (lasix) and potassium    Call us when you are done with [facility]

## 2021-11-05 NOTE — Telephone Encounter (Signed)
Appt sch 11/20/21 w/Dr Bensimhon

## 2021-11-06 NOTE — Telephone Encounter (Signed)
Emailed completed application to [email or web address].

## 2021-11-19 ENCOUNTER — Telehealth: Payer: Self-pay | Admitting: Family

## 2021-11-19 NOTE — Telephone Encounter (Signed)
Notified patient that he was [place] for [place] farxiga patient assistance until 6/23.   Raelyn Racette, NT

## 2021-11-20 ENCOUNTER — Ambulatory Visit (HOSPITAL_COMMUNITY)
Admission: RE | Admit: 2021-11-20 | Discharge: 2021-11-20 | Disposition: A | Discharge status: Home / Self Care | Payer: Medicare HMO | Source: Ambulatory Visit | Attending: Internal Medicine | Admitting: Internal Medicine

## 2021-11-20 ENCOUNTER — Encounter (HOSPITAL_COMMUNITY): Payer: Self-pay | Admitting: Internal Medicine

## 2021-11-20 ENCOUNTER — Other Ambulatory Visit: Payer: Self-pay

## 2021-11-20 VITALS — BP 130/86 | HR 106 | Ht 74.0 in | Wt 240.0 lb

## 2021-11-20 DIAGNOSIS — Z7982 Long term (current) use of aspirin: Secondary | ICD-10-CM | POA: Insufficient documentation

## 2021-11-20 DIAGNOSIS — E785 Hyperlipidemia, unspecified: Secondary | ICD-10-CM | POA: Insufficient documentation

## 2021-11-20 DIAGNOSIS — Z7984 Long term (current) use of oral hypoglycemic drugs: Secondary | ICD-10-CM | POA: Diagnosis not present

## 2021-11-20 DIAGNOSIS — I491 Atrial premature depolarization: Secondary | ICD-10-CM | POA: Insufficient documentation

## 2021-11-20 DIAGNOSIS — R000 Tachycardia, unspecified: Secondary | ICD-10-CM | POA: Insufficient documentation

## 2021-11-20 DIAGNOSIS — K767 Hepatorenal syndrome: Secondary | ICD-10-CM | POA: Diagnosis not present

## 2021-11-20 DIAGNOSIS — R911 Solitary pulmonary nodule: Secondary | ICD-10-CM | POA: Insufficient documentation

## 2021-11-20 DIAGNOSIS — I130 Hypertensive heart and chronic kidney disease with heart failure and stage 1 through stage 4 chronic kidney disease, or unspecified chronic kidney disease: Secondary | ICD-10-CM | POA: Insufficient documentation

## 2021-11-20 DIAGNOSIS — I43 Cardiomyopathy in diseases classified elsewhere: Secondary | ICD-10-CM | POA: Insufficient documentation

## 2021-11-20 DIAGNOSIS — J90 Pleural effusion, not elsewhere classified: Secondary | ICD-10-CM | POA: Diagnosis not present

## 2021-11-20 DIAGNOSIS — R7989 Other specified abnormal findings of blood chemistry: Secondary | ICD-10-CM | POA: Insufficient documentation

## 2021-11-20 DIAGNOSIS — F1090 Alcohol use, unspecified, uncomplicated: Secondary | ICD-10-CM | POA: Insufficient documentation

## 2021-11-20 DIAGNOSIS — Z79899 Other long term (current) drug therapy: Secondary | ICD-10-CM | POA: Diagnosis not present

## 2021-11-20 DIAGNOSIS — I2510 Atherosclerotic heart disease of native coronary artery without angina pectoris: Secondary | ICD-10-CM

## 2021-11-20 DIAGNOSIS — I5023 Acute on chronic systolic (congestive) heart failure: Secondary | ICD-10-CM | POA: Insufficient documentation

## 2021-11-20 DIAGNOSIS — N182 Chronic kidney disease, stage 2 (mild): Secondary | ICD-10-CM | POA: Diagnosis not present

## 2021-11-20 DIAGNOSIS — E854 Organ-limited amyloidosis: Secondary | ICD-10-CM | POA: Insufficient documentation

## 2021-11-20 DIAGNOSIS — K7031 Alcoholic cirrhosis of liver with ascites: Secondary | ICD-10-CM | POA: Diagnosis not present

## 2021-11-20 DIAGNOSIS — N2889 Other specified disorders of kidney and ureter: Secondary | ICD-10-CM | POA: Insufficient documentation

## 2021-11-20 DIAGNOSIS — F1010 Alcohol abuse, uncomplicated: Secondary | ICD-10-CM

## 2021-11-20 DIAGNOSIS — I5020 Unspecified systolic (congestive) heart failure: Secondary | ICD-10-CM

## 2021-11-20 MED ORDER — TORSEMIDE 20 MG PO TABS: 40.0000 mg | ORAL_TABLET | Freq: Every day | ORAL | 3 refills | Status: DC

## 2021-11-20 NOTE — Progress Notes (Addendum)
Advanced Heart Failure Clinic Note   Referring Physician: PCP: Manson Allan, MD PCP-Cardiologist: Kate Sable, MD   HPI:  71 y.o. male with history of CAD, chronic systolic CHF/CM, alcohol use with cirrhosis and ascites, CKD II-III, HTN, HLD, recently discovered renal mass and right lung nodule (CT chest 10/22).  Has been referred by Christell Faith, PA with [facility] for management of cardiac amyloidosis.  Cardiac history as follows:  Echo at PCP's office 01/27/2021: EF 25-30%, severe concentric LVH, severely reduced RV, severe BAE, mild MR. Findings on echo concerning for infiltrative CM. cMRI recommended.  Later admitted 02/02/21 with acute systolic CHF c/b hepatorenal syndrome. Diuresed with low-dose dopamine and IV lasix.   Admitted to [facility] 10/22 with a/c CHF. BNP 4339. Echo with EF < 20%, moderately reduced RV, mild MR. Diuresed with lasix gtt. Hs troponin up to 420. R/LHC 09/08/21 with CTO mid LAD, moderate disease Lcx and RCA, RA 6, PA 55/20 (32), PCWP 16, Fick CO 5.2/CI 2.3, PA sat 69%. Plan to consider viability study at later date once stable. Initiation of GDMT limited by hypotension.   cMRI 11/03/21: LVEF 12%, severely reduced RV with RVEF 18%, extensive LGE throughout LV myocardium and patchy LGE in RV, elevated T1 and ECV. 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 Moderate right and small left pleural effusions.  Last seen by Darylene Price, NP on 12/29. Appeared volume up. Furosemide increased.   Reports he went to SNF for rehab after his last admission. Now getting PT at home. Has worked up to ambulating short distances with a walker. Does get short of breath with exertion with exertion. Occasional orthopnea, no PND. Leg edema comes and goes, worse if he sits with his feet down. Wearing compression stockings. Home weight has been between 235-236 lb the last few weeks. Denies CP. No palpitations or dizziness.   He is taking all medications as  prescribed.   FH: Mother had HF in her 27s. 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  Review of Systems: [y] = yes, [ ]  = no   General: Weight gain [ ] ; Weight loss [ ] ; Anorexia [ ] ; Fatigue [ ] ; Fever [ ] ; Chills [ ] ; Weakness [ ]   Cardiac: Chest pain/pressure [ ] ; Resting SOB [ ] ; Exertional SOB [ ] ; Orthopnea [ ] ; Pedal Edema [ ] ; Palpitations [ ] ; Syncope [ ] ; Presyncope [ ] ; Paroxysmal nocturnal dyspnea[ ]   Pulmonary: Cough [ ] ; Wheezing[ ] ; Hemoptysis[ ] ; Sputum [ ] ; Snoring [ ]   GI: Vomiting[ ] ; Dysphagia[ ] ; Melena[ ] ; Hematochezia [ ] ; Heartburn[ ] ; Abdominal pain [ ] ; Constipation [ ] ; Diarrhea [ ] ; BRBPR [ ]   GU: Hematuria[ ] ; Dysuria [ ] ; Nocturia[ ]   Vascular: Pain in legs with walking [ ] ; Pain in feet with lying flat [ ] ; Non-healing sores [ ] ; Stroke [ ] ; TIA [ ] ; Slurred speech [ ] ;  Neuro: Headaches[ ] ; Vertigo[ ] ; Seizures[ ] ; Paresthesias[ ] ;Blurred vision [ ] ; Diplopia [ ] ; Vision changes [ ]   Ortho/Skin: Arthritis [ ] ; Joint pain [ ] ; Muscle pain [ ] ; Joint swelling [ ] ; Back Pain [ ] ; Rash [ ]   Psych: Depression[ ] ; Anxiety[ ]   Heme: Bleeding problems [ ] ; Clotting disorders [ ] ; Anemia [ ]   Endocrine: Diabetes [ ] ; Thyroid dysfunction[ ]    Past Medical History:  Diagnosis Date   CHF (congestive heart failure) ([place])  Chronic kidney disease    Hyperlipidemia    Hypertension    Peripheral edema     Current Outpatient Medications  Medication Sig Dispense Refill   acetaminophen (TYLENOL) 500 MG tablet Take 500-1,000 mg by mouth every 6 (six) hours as needed for mild pain or moderate pain.     aspirin EC 81 MG tablet Take 1 tablet (81 mg total) by mouth daily. Swallow whole. 30 tablet 5   dapagliflozin propanediol (FARXIGA) 10 MG TABS tablet Take 1 tablet (10 mg total) by mouth daily before breakfast. 30 tablet 3   feeding supplement (ENSURE ENLIVE / ENSURE PLUS) LIQD Take 237 mLs by mouth  3 (three) times daily between meals. 237 mL 12   magnesium hydroxide (MILK OF MAGNESIA) 400 MG/5ML suspension Take 30 mLs by mouth daily as needed for mild constipation. 355 mL 0   potassium chloride SA (KLOR-CON) 20 MEQ tablet Take 1 tablet (20 mEq total) by mouth daily. 30 tablet 5   PROAIR HFA 108 (90 Base) MCG/ACT inhaler Inhale 1-2 puffs into the lungs every 6 (six) hours as needed for wheezing.     sacubitril-valsartan (ENTRESTO) 24-26 MG Take 1 tablet by mouth 2 (two) times daily.     No current facility-administered medications for this encounter.    No Known Allergies    Social History   Socioeconomic History   Marital status: Single    Spouse name: Not on file   Number of children: Not on file   Years of education: Not on file   Highest education level: Not on file  Occupational History   Not on file  Tobacco Use   Smoking status: Never   Smokeless tobacco: Never  Vaping Use   Vaping Use: Never used  Substance and Sexual Activity   Alcohol use: Not on file   Drug use: Not on file   Sexual activity: Not on file  Other Topics Concern   Not on file  Social History Narrative   ** Merged History Encounter **       Social Determinants of Health   Financial Resource Strain: Not on file  Food Insecurity: Not on file  Transportation Needs: Not on file  Physical Activity: Not on file  Stress: Not on file  Social Connections: Not on file  Intimate Partner Violence: Not on file     History reviewed. No pertinent family history.  Vitals:   11/20/21 1513  BP: 130/86  Pulse: (!) 106  SpO2: 97%  Weight: 108.9 kg (240 lb)  Height: 6\' 2"  (1.88 m)     PHYSICAL EXAM: General:  Chronically ill appearing. Sitting in wheelchair. HEENT: normal Neck: supple. JVP to jaw. Carotids 2+ bilat; no bruits. No lymphadenopathy or thyromegaly appreciated. Cor: PMI nondisplaced. Regular rhythm, tachy. No rubs, gallops or murmurs. Lungs: clear Abdomen: soft, nontender,  nondistended. No hepatosplenomegaly. No bruits or masses. Good bowel sounds. Extremities: no cyanosis, clubbing, rash, 2+ edema to knees. TED hose on. Neuro: alert & oriented x 3, cranial nerves grossly intact. moves all 4 extremities w/o difficulty. Affect pleasant.  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 Moderate right and small left pleural effusions. -NYHA III. Appears volume overloaded.  -Stop Furosemide. Start 40 mg Torsemide daily. -Continue Entresto 24/26 mg BID -Continue Farxiga 10 mg daily -Held off on spiro for now. Consider at subsequent f/u depending on K.  -No beta blocker with a/c CHF -BMET 1 week -Workup for amyloidosis (see below)  2. Cardiac amyloidosis: -LGE pattern on cMRI appears consistent with cardiac amyloidosis -Check UPEP and myeloma panel -Genetic testing -PYP scan  3. CAD: -CTO LAD with collaterals, moderate disease Lcx and RCA -Managed medically -Denies angina -On aspirin -Off statin with elevated LFTs  4. CKD II -Last Scr 0.88 -BMET 1 week  5. HTN: -BP stable  6. HLD: -Off statin per primary cardiologist given elevated LFTs  7. Liver cirrhosis: -Noted on US abdomen -Has cut back alcohol use significantly. Needs to completely refrain.   Follow-up: 2 weeks  FINCH, LINDSAY N, PA-C 11/20/21   Patient seen and examined with the  above-signed Advanced Practice Provider and/or Housestaff. I personally reviewed laboratory data, imaging studies and relevant notes. I independently examined the patient and formulated the important aspects of the plan. I have edited the note to reflect any of my changes or salient points. I have personally discussed the plan with the patient and/or family.  71 y/o male with HTN, ETOH cirrhosis, CAD with occluded LAD with L->L and R->L collaterals and systolic HF with recent cMRI with EF 12% LVH and diffuse LGE suggestive of cardiac amyloidosis.   Currently NYHA III-IIIb with marked volume overload on exam. No angina  General:  Sitting in chair No resp difficulty HEENT: normal Neck: supple. JVP to ear Carotids 2+ bilat; no bruits. No lymphadenopathy or thryomegaly appreciated. Cor: PMI nondisplaced. Regular rate & rhythm. No rubs, gallops or murmurs. Lungs: clear Abdomen: soft, nontender, + distended. No hepatosplenomegaly. No bruits or masses. Good bowel sounds. Extremities: no cyanosis, clubbing, rash, 2-3+  edema Neuro: alert & orientedx3, cranial nerves grossly intact. moves all 4 extremities w/o difficulty. Affect pleasant   Complicated situation. I have reviewed cath films, echo and cMRI images. Based on cath films I would expect him to have at least some scar on MRI but LGE appears to be mostly non-cardiac pattern. Echo very suggestive of amyloid. 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  NYHA III-IIIB with marked volume overload.   Will switch lasix to torsemide. Check myeloma panel, PYP and genetic testing.   Long discussion about pathophysiology and possible treatment of cardiac amyloidosis and HF.   Will see back in 2-3 weeks to reassess volume status.   Total time spent 55 minutes. Over half that time spent discussing above.   Glori Bickers, MD  9:58 PM

## 2021-11-20 NOTE — Patient Instructions (Signed)
Medication Changes:  Stop Furisemide  Start Torsemide 40mg  (2 Tabs) Daily.  Lab Work:  Labs done today, your results will be available in MyChart, we will contact you for abnormal readings.   Testing/Procedures:  You have been ordered a PYP Scan.  This is done in the Radiology Department of [facility].  When you come for this test please plan to be there 2-3 hours. ONCE APPROVED BY INSURANCE YOU WILL BE CALLED TO SCHEDULE AND APPOINTMENT.  Referrals:  none  Special Instructions // Education:  none  Follow-Up in: 2 weeks   At the Advanced Heart Failure Clinic, you and your health needs are our priority. We have a designated team specialized in the treatment of Heart Failure. This Care Team includes your primary Heart Failure Specialized Cardiologist (physician), Advanced Practice Providers (APPs- Physician Assistants and Nurse Practitioners), and Pharmacist who all work together to provide you with the care you need, when you need it.   You may see any of the following providers on your designated Care Team at your next follow up:  Dr [facility] Dr Arvilla Meres, NP Carron Curie, Robbie Lis [facility], [place], PharmD   Please be sure to bring in all your medications bottles to every appointment.   Need to Contact Karle Plumber:  If you have any questions or concerns before your next appointment please send [place] a message through [facility] or call our office at [phone].    TO LEAVE A MESSAGE FOR THE NURSE SELECT OPTION 2, PLEASE LEAVE A MESSAGE INCLUDING: YOUR NAME DATE OF BIRTH CALL BACK NUMBER REASON FOR CALL**this is important as we prioritize the call backs  YOU WILL RECEIVE A CALL BACK THE SAME DAY AS LONG AS YOU CALL BEFORE 4:00 PM

## 2021-11-23 ENCOUNTER — Other Ambulatory Visit: Payer: Self-pay

## 2021-11-23 ENCOUNTER — Other Ambulatory Visit
Admission: RE | Admit: 2021-11-23 | Discharge: 2021-11-23 | Disposition: A | Discharge status: Home / Self Care | Payer: Medicare HMO | Source: Ambulatory Visit | Attending: Physician Assistant | Admitting: Physician Assistant

## 2021-11-23 ENCOUNTER — Ambulatory Visit: Payer: Medicare HMO | Admitting: Nurse Practitioner

## 2021-11-23 ENCOUNTER — Encounter: Payer: Self-pay | Admitting: Physician Assistant

## 2021-11-23 VITALS — BP 110/70 | HR 106 | Ht 74.0 in | Wt 228.2 lb

## 2021-11-23 DIAGNOSIS — I2510 Atherosclerotic heart disease of native coronary artery without angina pectoris: Secondary | ICD-10-CM

## 2021-11-23 DIAGNOSIS — E854 Organ-limited amyloidosis: Secondary | ICD-10-CM

## 2021-11-23 DIAGNOSIS — I10 Essential (primary) hypertension: Secondary | ICD-10-CM | POA: Diagnosis not present

## 2021-11-23 DIAGNOSIS — I5022 Chronic systolic (congestive) heart failure: Secondary | ICD-10-CM | POA: Diagnosis present

## 2021-11-23 DIAGNOSIS — I5020 Unspecified systolic (congestive) heart failure: Secondary | ICD-10-CM | POA: Diagnosis not present

## 2021-11-23 DIAGNOSIS — N182 Chronic kidney disease, stage 2 (mild): Secondary | ICD-10-CM

## 2021-11-23 DIAGNOSIS — I43 Cardiomyopathy in diseases classified elsewhere: Secondary | ICD-10-CM

## 2021-11-23 DIAGNOSIS — E785 Hyperlipidemia, unspecified: Secondary | ICD-10-CM

## 2021-11-23 LAB — COMPREHENSIVE METABOLIC PANEL
ALT: 11 U/L (ref 0–44)
AST: 19 U/L (ref 15–41)
Albumin: 3.5 g/dL (ref 3.5–5.0)
Alkaline Phosphatase: 79 U/L (ref 38–126)
Anion gap: 9 (ref 5–15)
BUN: 15 mg/dL (ref 8–23)
CO2: 28 mmol/L (ref 22–32)
Calcium: 9 mg/dL (ref 8.9–10.3)
Chloride: 100 mmol/L (ref 98–111)
Creatinine, Ser: 1.23 mg/dL (ref 0.61–1.24)
GFR, Estimated: >60 mL/min (ref >60)
Glucose, Bld: 106 mg/dL — ABNORMAL HIGH (ref 70–99)
Potassium: 3 mmol/L — ABNORMAL LOW (ref 3.5–5.1)
Sodium: 137 mmol/L (ref 135–145)
Total Bilirubin: 2 mg/dL — ABNORMAL HIGH (ref 0.3–1.2)
Total Protein: 7.3 g/dL (ref 6.5–8.1)

## 2021-11-23 LAB — IMMUNOFIXATION, URINE

## 2021-11-23 NOTE — Patient Instructions (Signed)
Medication Instructions:  No changes at this time.  *If you need a refill on your cardiac medications before your next appointment, please call your pharmacy*   Lab Work: CMET today  If you have labs (blood work) drawn today and your tests are completely normal, you will receive your results only by: MyChart Message (if you have MyChart) OR A paper copy in the mail If you have any lab test that is abnormal or we need to change your treatment, we will call you to review the results.   Testing/Procedures: None   Follow-Up: At [facility], you and your health needs are our priority.  As part of our continuing mission to provide you with exceptional heart care, we have created designated Provider Care Teams.  These Care Teams include your primary Cardiologist (physician) and Advanced Practice Providers (APPs -  Physician Assistants and Nurse Practitioners) who all work together to provide you with the care you need, when you need it.   Your next appointment:   3 month(s)  The format for your next appointment:   In Person  Provider:   Debbe Odea, MD or Eula Listen, PA-C

## 2021-11-23 NOTE — Progress Notes (Signed)
Office Visit    Patient Name: Tyler Ochoa Date of Encounter: 11/23/2021  Primary Care Provider:  Manson Allan, MD Primary Cardiologist:  Kate Sable, MD  Chief Complaint    71 year old male with a history of CAD, chronic systolic CHF/ICM, cardiac amyloidosis, hypertension, hyperlipidemia, CKD stage II-III, alcohol use with cirrhosis and ascites, and renal mass and right lung nodule who presents for follow-up related to heart failure.  Past Medical History    Past Medical History:  Diagnosis Date   CHF (congestive heart failure) ([place])    Chronic kidney disease    Hyperlipidemia    Hypertension    Peripheral edema    Past Surgical History:  Procedure Laterality Date   RIGHT/LEFT HEART CATH AND CORONARY ANGIOGRAPHY N/A 09/08/2021   Procedure: RIGHT/LEFT HEART CATH AND CORONARY ANGIOGRAPHY;  Surgeon: Nelva Bush, MD;  Location: [facility];  Service: Cardiovascular;  Laterality: N/A;    Allergies  No Known Allergies  History of Present Illness    71 year old male with the above past medical history including CAD, chronic systolic CHF/ICM, cardiac amyloidosis, hypertension, hyperlipidemia, CKD stage II-III, alcohol use with cirrhosis and ascites, and renal mass and right lung nodule who presents for follow-up related to heart failure.  Outpatient echocardiogram per PCP in March 2022 showed an EF of 25 to 30%, global hypokinesis, severe concentric LVH, indeterminate LV diastolic function parameters, severely reduced RV systolic function with mildly enlarged RV cavity size and mildly increased RV wall thickness, severe BAE, small pericardial effusion noted posterior and lateral to the left ventricle, mild MR, and a mildly dilated aortic root measuring 41 mm. Findings were concerning for possible infiltrative cardiomyopathy such as amyloidosis.  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  He was placed on IV dobutamine and IV Lasix, further work-up of cardiomyopathy was not pursued at that time (seen by outside cardiology group).    He was again admitted to [facility] in October 2022 for heart failure at which time his care transitioned to [facility] heart care.  Hospital admission was complicated by liver cirrhosis noted on CT abdomen pelvis, transaminitis, hypoalbuminemia, AKI, a right renal mass, pulmonary nodule, and stage II pressure injury to the buttocks.  Repeat echocardiogram demonstrated an EF of <20%, global hypokinesis, mild concentric LVH, G3 DD, moderately reduced RV systolic function with mildly enlarged RV cavity size, mildly dilated left atrium, mildly dilated right atrium echo mild MR. R/LHC on 09/08/2021 showed multivessel CAD including CTO of the mid LAD as well as moderate LCx and RCA disease of up to 70% stenosis, mildly elevated left and right heart filling pressures, mild to moderate pulmonary hypertension, and mildly reduced cardiac output/index.  Escalation of GDMT was limited in the setting of hypotension.  He was discharged to SNF and later transitioned home with home health PT.  Cardiac MRI was recommended for viability testing, which showed LGE pattern consistent with cardiac amyloidosis. He was referred to the advanced heart failure clinic for evaluation and further management at which time his Lasix was switched to po torsemide.  Myeloma panel, PYP and genetic testing are currently pending per Dr. Haroldine Laws as part of work-up of cardiac amyloidosis.  He presents today for follow-up. Since his last visit he has been stable from a cardiac standpoint.  His weight is down 6-8 pounds since transitioning from furosemide to torsemide on 11/20/2021. His swelling has also decreased.  He reports stable orthopnea.  He denies dyspnea, or any symptoms concerning for angina.  He has been monitoring his sodium and fluid intake.  Overall, he reports a significant  improvement in his symptoms.  Home Medications    Current Outpatient Medications  Medication Sig Dispense Refill   acetaminophen (TYLENOL) 500 MG tablet Take 500-1,000 mg by mouth every 6 (six) hours as needed for mild pain or moderate pain.     aspirin EC 81 MG tablet Take 1 tablet (81 mg total) by mouth daily. Swallow whole. 30 tablet 5   dapagliflozin propanediol (FARXIGA) 10 MG TABS tablet Take 1 tablet (10 mg total) by mouth daily before breakfast. 30 tablet 3   magnesium hydroxide (MILK OF MAGNESIA) 400 MG/5ML suspension Take 30 mLs by mouth daily as needed for mild constipation. 355 mL 0   potassium chloride SA (KLOR-CON) 20 MEQ tablet Take 1 tablet (20 mEq total) by mouth daily. 30 tablet 5   PROAIR HFA 108 (90 Base) MCG/ACT inhaler Inhale 1-2 puffs into the lungs every 6 (six) hours as needed for wheezing.     sacubitril-valsartan (ENTRESTO) 24-26 MG Take 1 tablet by mouth 2 (two) times daily.     torsemide (DEMADEX) 20 MG tablet Take 2 tablets (40 mg total) by mouth daily. 180 tablet 3   feeding supplement (ENSURE ENLIVE / ENSURE PLUS) LIQD Take 237 mLs by mouth 3 (three) times daily between meals. (Patient not taking: Reported on 11/23/2021) 237 mL 12   No current facility-administered medications for this visit.     Review of Systems    He denies chest pain, palpitations, dyspnea, pnd, n, v, dizziness, syncope, weight gain, or early satiety. All other systems reviewed and are otherwise negative except as noted above.   Physical Exam    VS:  BP 110/70 (BP Location: Left Arm, Patient Position: Sitting, Cuff Size: Normal)    Pulse (!) 106    Ht 6\' 2"  (1.88 m)    Wt 228 lb 4 oz (103.5 kg)    SpO2 95%    BMI 29.31 kg/m   GEN: Well nourished, well developed, in no acute distress. HEENT: normal. Neck: Supple, positive for JVD, no carotid bruits, or masses. Cardiac: RRR, no murmurs, rubs, or gallops. No clubbing, cyanosis. 1 + edema, TED hoes on.  Radials/DP/PT 2+ and equal  bilaterally.  Respiratory:  Respirations regular and unlabored, clear to auscultation bilaterally. GI: Soft, nontender, nondistended, BS + x 4. MS: no deformity or atrophy. Skin: warm and dry, no rash. Neuro:  Strength and sensation are intact. Psych: Normal affect.  Accessory Clinical Findings    ECG personally reviewed by me today - Sinus tachycardia with occasional PVCs, 106 bpm, low voltage QRS, non-specific ST changes - no acute changes.  Lab Results  Component Value Date   WBC 4.5 09/09/2021   HGB 16.1 09/09/2021   HCT 46.5 09/09/2021   MCV 81.6 09/09/2021   PLT 66 (L) 09/09/2021   Lab Results  Component Value Date   CREATININE 0.88 09/29/2021   BUN 25 09/29/2021   NA 135 09/29/2021   K 4.7 09/29/2021   CL 97 09/29/2021   CO2 20 09/29/2021   Lab Results  Component Value Date   ALT 90 (H) 09/29/2021   AST 152 (H) 09/29/2021   ALKPHOS 206 (H) 09/29/2021   BILITOT 2.1 (H) 09/29/2021   Lab Results  Component Value Date   CHOL 160 09/29/2021   HDL 34 (L) 09/29/2021   LDLCALC  112 (H) 09/29/2021   LDLDIRECT 104 (H) 09/29/2021   TRIG 69 09/29/2021   CHOLHDL 4.7 09/29/2021    Lab Results  Component Value Date   HGBA1C 6.6 (H) 02/09/2021    Assessment & Plan   1. Chronic systolic heart failure: Likely mixed ischemic/nonischemic in the setting of CAD and cardiac amyloidosis.   Recent echo 08/27/2021 showed EF less than 20%, RV moderately reduced, mild MR.  R/LHC on 09/08/2021 showed multivessel CAD including CTO of the mid LAD as well as moderate LCx and RCA disease of up to 70% stenosis, mildly elevated left and right heart filling pressures, mild to moderate pulmonary hypertension, and mildly reduced cardiac output/index.  cMRI showed LVEF 12%, severely reduced RV with RV EF 18%, extensive late gadolinium enhancement (LGE) throughout LV myocardium and patchy LGE in RV, consistent with cardiac amyloidosis. Referred to advanced heart failure clinic and was switched from  furosemide to torsemide 40 mg daily most recent visit with Dr. Haroldine Laws on 11/20/2021.  Recorded weight at visit with Dr. Haroldine Laws shows 140 pounds. However, he states this is incorrectly documented and he is certain he weighed [id number] pounds at that visit.  Weight is 228.4 lbs today, down 6 pounds.  Not on beta-blocker per advanced heart failure team.  Continue torsemide, Entresto 24/26 twice daily, Farxiga.  Work-up for amyloidosis pending per advanced heart failure team.  Repeat BMET scheduled per Dr. Clayborne Dana office, however, given elevated liver enzymes during recent hospitalization, will f/u CMET today. Follow-up with Dr. Haroldine Laws as scheduled.  2. Cardiac amyloidosis: See above. Work-up pending per advanced heart failure clinic.  3. CAD: Managed medically. Not on statin in the setting of elevated LFTs.  Direct LDL was 104 on 09/29/2021. Obtain CMET today.  Is unlikely he will tolerate statin in the future.  Given multivessel CAD, consider referral to lipid clinic for consideration of PCSK9 inhibitor at follow-up visit.  Continue aspirin, current medications as above.  4. Hypertension: BP well controlled. Continue current antihypertensive regimen.   5. Hyperlipidemia: See above.  6. CKD stage II-III: Creatinine 0.88 on 09/29/2021. Repeat CMET today.   7. Disposition: Follow-up with advanced heart failure clinic as scheduled.  Follow-up in 3 to 4 months.  Lenna Sciara, NP 11/23/2021, 4:52 PM

## 2021-11-24 ENCOUNTER — Telehealth: Payer: Self-pay | Admitting: *Deleted

## 2021-11-24 DIAGNOSIS — E876 Hypokalemia: Secondary | ICD-10-CM

## 2021-11-24 NOTE — Telephone Encounter (Signed)
Spoke with pt. Notified of lab results and provider's recc.  Pt voiced understanding with readback.  He will incr KCl to 60 mEq today and tomorrow only.  Pt will take 3 tablets for total of 60 mEq today and tomorrow then will resume 20 mEq daily thereafter. Pt has been scheduled to come in our [place] office this Friday 11/27/21.  Pt has to arrange transportation to [place] but no transportation limitations if he comes to our office.  Pt has no further questions at this time.

## 2021-11-24 NOTE — Telephone Encounter (Signed)
-----   Message from Lenna Sciara, NP sent at 11/24/2021  8:54 AM EST ----- Yesterdays labs showed stable kidney function, and normal liver enzymes, which is great news. His potassium was low at 3.0. Please have him take an additional 40 mEq of KCl today and tomorrow for a total of 63mEq these two days. Then resume 20 mEq daily thereafter. It looks like he still has an active order for a BMET (ordered by advanced heart failure clinic). I would like for him have this drawn later this week if possible (Thursday or Friday). Thank you!

## 2021-11-26 ENCOUNTER — Other Ambulatory Visit: Payer: Medicare HMO

## 2021-11-26 ENCOUNTER — Telehealth (HOSPITAL_COMMUNITY): Payer: Self-pay | Admitting: *Deleted

## 2021-11-26 NOTE — Telephone Encounter (Signed)
° °  No pre cert reqd for pyp

## 2021-11-27 ENCOUNTER — Other Ambulatory Visit (INDEPENDENT_AMBULATORY_CARE_PROVIDER_SITE_OTHER): Payer: Medicare HMO

## 2021-11-27 ENCOUNTER — Other Ambulatory Visit: Payer: Self-pay

## 2021-11-27 DIAGNOSIS — E876 Hypokalemia: Secondary | ICD-10-CM | POA: Diagnosis not present

## 2021-11-27 LAB — MULTIPLE MYELOMA PANEL, SERUM
Albumin SerPl Elph-Mcnc: 3.6 g/dL (ref 2.9–4.4)
Albumin/Glob SerPl: 1 (ref 0.7–1.7)
Alpha 1: 0.3 g/dL (ref 0.0–0.4)
Alpha2 Glob SerPl Elph-Mcnc: 0.5 g/dL (ref 0.4–1.0)
B-Globulin SerPl Elph-Mcnc: 1.2 g/dL (ref 0.7–1.3)
Gamma Glob SerPl Elph-Mcnc: 1.9 g/dL — ABNORMAL HIGH (ref 0.4–1.8)
Globulin, Total: 3.9 g/dL (ref 2.2–3.9)
IgA: 442 mg/dL — ABNORMAL HIGH (ref 61–437)
IgG (Immunoglobin G), Serum: 1814 mg/dL — ABNORMAL HIGH (ref 603–1613)
IgM (Immunoglobulin M), Srm: 235 mg/dL — ABNORMAL HIGH (ref 20–172)
Total Protein ELP: 7.5 g/dL (ref 6.0–8.5)

## 2021-11-28 LAB — BASIC METABOLIC PANEL
BUN/Creatinine Ratio: 11 (ref 10–24)
BUN: 16 mg/dL (ref 8–27)
CO2: 19 mmol/L — ABNORMAL LOW (ref 20–29)
Calcium: 9.5 mg/dL (ref 8.6–10.2)
Chloride: 99 mmol/L (ref 96–106)
Creatinine, Ser: 1.47 mg/dL — ABNORMAL HIGH (ref 0.76–1.27)
Glucose: 105 mg/dL — ABNORMAL HIGH (ref 70–99)
Potassium: 4 mmol/L (ref 3.5–5.2)
Sodium: 139 mmol/L (ref 134–144)
eGFR: 52 mL/min/{1.73_m2} — ABNORMAL LOW (ref >59)

## 2021-12-04 ENCOUNTER — Other Ambulatory Visit: Payer: Self-pay

## 2021-12-04 ENCOUNTER — Encounter (HOSPITAL_COMMUNITY): Payer: Self-pay | Admitting: Internal Medicine

## 2021-12-04 ENCOUNTER — Ambulatory Visit (HOSPITAL_COMMUNITY)
Admission: RE | Admit: 2021-12-04 | Discharge: 2021-12-04 | Disposition: A | Discharge status: Home / Self Care | Payer: Medicare HMO | Source: Ambulatory Visit | Attending: Internal Medicine | Admitting: Internal Medicine

## 2021-12-04 VITALS — BP 100/60 | HR 97 | Wt 218.0 lb

## 2021-12-04 DIAGNOSIS — F1010 Alcohol abuse, uncomplicated: Secondary | ICD-10-CM | POA: Diagnosis not present

## 2021-12-04 DIAGNOSIS — E854 Organ-limited amyloidosis: Secondary | ICD-10-CM

## 2021-12-04 DIAGNOSIS — I5023 Acute on chronic systolic (congestive) heart failure: Secondary | ICD-10-CM | POA: Diagnosis present

## 2021-12-04 DIAGNOSIS — Z79899 Other long term (current) drug therapy: Secondary | ICD-10-CM | POA: Diagnosis not present

## 2021-12-04 DIAGNOSIS — E859 Amyloidosis, unspecified: Secondary | ICD-10-CM | POA: Insufficient documentation

## 2021-12-04 DIAGNOSIS — I5022 Chronic systolic (congestive) heart failure: Secondary | ICD-10-CM

## 2021-12-04 DIAGNOSIS — I43 Cardiomyopathy in diseases classified elsewhere: Secondary | ICD-10-CM

## 2021-12-04 DIAGNOSIS — N182 Chronic kidney disease, stage 2 (mild): Secondary | ICD-10-CM | POA: Diagnosis not present

## 2021-12-04 DIAGNOSIS — I2510 Atherosclerotic heart disease of native coronary artery without angina pectoris: Secondary | ICD-10-CM | POA: Insufficient documentation

## 2021-12-04 DIAGNOSIS — Z7984 Long term (current) use of oral hypoglycemic drugs: Secondary | ICD-10-CM | POA: Diagnosis not present

## 2021-12-04 DIAGNOSIS — I130 Hypertensive heart and chronic kidney disease with heart failure and stage 1 through stage 4 chronic kidney disease, or unspecified chronic kidney disease: Secondary | ICD-10-CM | POA: Insufficient documentation

## 2021-12-04 DIAGNOSIS — E785 Hyperlipidemia, unspecified: Secondary | ICD-10-CM | POA: Insufficient documentation

## 2021-12-04 DIAGNOSIS — Z7982 Long term (current) use of aspirin: Secondary | ICD-10-CM | POA: Insufficient documentation

## 2021-12-04 DIAGNOSIS — K7460 Unspecified cirrhosis of liver: Secondary | ICD-10-CM | POA: Insufficient documentation

## 2021-12-04 LAB — BASIC METABOLIC PANEL
Anion gap: 8 (ref 5–15)
BUN: 20 mg/dL (ref 8–23)
CO2: 29 mmol/L (ref 22–32)
Calcium: 9.3 mg/dL (ref 8.9–10.3)
Chloride: 99 mmol/L (ref 98–111)
Creatinine, Ser: 1.37 mg/dL — ABNORMAL HIGH (ref 0.61–1.24)
GFR, Estimated: 56 mL/min — ABNORMAL LOW (ref >60)
Glucose, Bld: 104 mg/dL — ABNORMAL HIGH (ref 70–99)
Potassium: 3.4 mmol/L — ABNORMAL LOW (ref 3.5–5.1)
Sodium: 136 mmol/L (ref 135–145)

## 2021-12-04 MED ORDER — TORSEMIDE 20 MG PO TABS: 20.0000 mg | ORAL_TABLET | Freq: Every day | ORAL | 3 refills | Status: DC

## 2021-12-04 MED ORDER — SPIRONOLACTONE 25 MG PO TABS: 12.5000 mg | ORAL_TABLET | Freq: Every day | ORAL | 3 refills | Status: DC

## 2021-12-04 NOTE — Addendum Note (Signed)
Encounter addended by: Noralee Space, RN on: 12/04/2021 10:26 AM  Actions taken: Order list changed, Diagnosis association updated, Clinical Note Signed, Charge Capture section accepted

## 2021-12-04 NOTE — Patient Instructions (Signed)
Medication Changes:  Start Spironolactone 12.5 mg (1/2 tab) Daily  Decrease Torsemide to 20 mg (1 tab) Daily  Lab Work:  Labs done today, your results will be available in MyChart, we will contact you for abnormal readings.  Your physician recommends that you return for lab work in: 1 week, this can be done at [facility]  Testing/Procedures:  None  Referrals:  None  Special Instructions // Education:  Do the following things EVERYDAY: Weigh yourself in the morning before breakfast. Write it down and keep it in a log. Take your medicines as prescribed Eat low salt foods--Limit salt (sodium) to 2000 mg per day.  Stay as active as you can everyday Limit all fluids for the day to less than 2 liters   Follow-Up in: 1 month  At the Advanced Heart Failure Clinic, you and your health needs are our priority. We have a designated team specialized in the treatment of Heart Failure. This Care Team includes your primary Heart Failure Specialized Cardiologist (physician), Advanced Practice Providers (APPs- Physician Assistants and Nurse Practitioners), and Pharmacist who all work together to provide you with the care you need, when you need it.   You may see any of the following providers on your designated Care Team at your next follow up:  Dr Arvilla Meres Dr Carron Curie, NP Robbie Lis, [place] [facility], [place] Karle Plumber, PharmD   Please be sure to bring in all your medications bottles to every appointment.   Need to Contact us:  If you have any questions or concerns before your next appointment please send [place] a message through [place] or call our office at [phone].    TO LEAVE A MESSAGE FOR THE NURSE SELECT OPTION 2, PLEASE LEAVE A MESSAGE INCLUDING: YOUR NAME DATE OF BIRTH CALL BACK NUMBER REASON FOR CALL**this is important as we prioritize the call backs  YOU WILL RECEIVE A CALL BACK THE SAME DAY AS LONG AS YOU CALL  BEFORE 4:00 PM

## 2021-12-04 NOTE — Progress Notes (Signed)
Advanced Heart Failure Clinic Note   Referring Physician: PCP: Queen SloughZhang, Jaimei, MD PCP-Cardiologist: Debbe OdeaBrian Agbor-Etang, MD   HPI:  71 y.o. male with history of CAD, chronic systolic CHF/CM, alcohol use with cirrhosis and ascites, CKD II-III, HTN, HLD, recently discovered renal mass and right lung nodule (CT chest 10/22).  Has been [facility] by Eula Listenyan Dunn, PA [facility] for management of cardiac amyloidosis.  Cardiac history as follows:  Echo at PCP's office 01/27/2021: EF 25-30%, severe concentric LVH, severely reduced RV, severe BAE, mild MR. Findings on echo concerning for infiltrative CM. cMRI recommended.  Later admitted 02/02/21 with acute systolic CHF c/b hepatorenal syndrome. Diuresed with low-dose dopamine and IV lasix.   [facility] with a/c CHF. BNP 4339. Echo with EF < 20%, moderately reduced RV, mild MR. Diuresed with lasix gtt. Hs troponin up to 420. R/LHC 09/08/21 with CTO mid LAD, moderate disease Lcx and RCA, RA 6, PA 55/20 (32), PCWP 16, Fick CO 5.2/CI 2.3, PA sat 69%. Plan to consider viability study at later date once stable. Initiation of GDMT limited by hypotension.   cMRI 11/03/21: LVEF 12%, severely reduced RV with RVEF 18%, extensive LGE throughout LV myocardium and patchy LGE in RV, elevated T1 and ECV. 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 Moderate right and small left pleural effusions.  Here is here for f/u. We saw him 2 weeks ago and was markedly volume overloaded. Last switched to torsemide. Weight down 10 pounds breathing much better. Edema much better. Able to get around the house and do ADLs. No dizziness.   PYP scheduled for 12/11/21  FH: Mother had HF in her 5080s. Brother passed from sudden death at age of 69  SH Previously consumed 18 beers/day for many years up until 03/22.    Past Medical History:  Diagnosis Date   CHF (congestive heart failure) ([facility])    Chronic kidney disease    Hyperlipidemia     Hypertension    Peripheral edema     Current Outpatient Medications  Medication Sig Dispense Refill   acetaminophen (TYLENOL) 500 MG tablet Take 500-1,000 mg by mouth every 6 (six) hours as needed for mild pain or moderate pain.     aspirin EC 81 MG tablet Take 1 tablet (81 mg total) by mouth daily. Swallow whole. 30 tablet 5   dapagliflozin propanediol (FARXIGA) 10 MG TABS tablet Take 1 tablet (10 mg total) by mouth daily before breakfast. 30 tablet 3   potassium chloride SA (KLOR-CON) 20 MEQ tablet Take 1 tablet (20 mEq total) by mouth daily. 30 tablet 5   PROAIR HFA 108 (90 Base) MCG/ACT inhaler Inhale 1-2 puffs into the lungs every 6 (six) hours as needed for wheezing.     sacubitril-valsartan (ENTRESTO) 24-26 MG Take 1 tablet by mouth 2 (two) times daily.     torsemide (DEMADEX) 20 MG tablet Take 2 tablets (40 mg total) by mouth daily. 180 tablet 3   No current facility-administered medications for this encounter.    No Known Allergies    Social History   Socioeconomic History   Marital status: Single    Spouse name: Not on file   Number of children: Not on file   Years of education: Not on file   Highest education level: Not on file  Occupational History   Not on file  Tobacco Use   Smoking status: Never   Smokeless tobacco: Never  Vaping Use   Vaping Use: Never used  Substance and Sexual Activity   Alcohol use: Not on file   Drug use: Not on file   Sexual activity: Not on file  Other Topics Concern   Not on file  Social History Narrative   ** Merged History Encounter **       Social Determinants of Health   Financial Resource Strain: Not on file  Food Insecurity: Not on file  Transportation Needs: Not on file  Physical Activity: Not on file  Stress: Not on file  Social Connections: Not on file  Intimate Partner Violence: Not on file     History reviewed. No pertinent family history.  Vitals:   12/04/21 0932  BP: 100/60  Pulse: 97  SpO2: 98%   Weight: 98.9 kg (218 lb)   Wt Readings from Last 3 Encounters:  12/04/21 98.9 kg (218 lb)  11/23/21 103.5 kg (228 lb 4 oz)  11/20/21 108.9 kg (240 lb)     PHYSICAL EXAM: General:  Well appearing. No resp difficulty HEENT: normal Neck: supple. no JVD. Carotids 2+ bilat; no bruits. No lymphadenopathy or thryomegaly appreciated. Cor: PMI nondisplaced. Regular rate & rhythm. No rubs, gallops or murmurs. Lungs: clear Abdomen: soft, nontender, nondistended. No hepatosplenomegaly. No bruits or masses. Good bowel sounds. Extremities: no cyanosis, clubbing, rash, edema Neuro: alert & orientedx3, cranial nerves grossly intact. moves all 4 extremities w/o difficulty. 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 Moderate right and small left pleural effusions. - Much improved with diuresis NYHA II-III - Decrease torsemide from 40 to 20 daily. Add spiro 12.5. Can take torsemide as needed -Continue Entresto 24/26 mg BID -Continue Farxiga 10 mg daily -No beta blocker yet - Labs today and in 1 week  - SPEP - polyclonal increase. No M-spike. Urine IFE negative. - PYP scheduled for 12/11/21  - Await genetic testing  2. Cardiac amyloidosis: -LGE pattern on cMRI appears consistent with cardiac amyloidosis - Myeloma panel negative -Genetic testing & PYP scan pending  3. CAD: -CTO LAD with collaterals,  moderate disease Lcx and RCA -Managed medically -No s/s angina -On aspirin -Off statin with elevated LFTs  4. CKD II -Last Scr 0.88 -> 1.47 -Repeat labs   5. HTN: -BP ok  6. HLD: -Off statin per primary cardiologist given elevated LFTs  7. Liver cirrhosis: -Noted on US abdomen -Has cut back alcohol use significantly. Needs to completely refrain.   Glori Bickers, MD 12/04/21

## 2021-12-11 ENCOUNTER — Encounter (HOSPITAL_COMMUNITY)
Admission: RE | Admit: 2021-12-11 | Discharge: 2021-12-11 | Disposition: A | Discharge status: Home / Self Care | Payer: Medicare HMO | Source: Ambulatory Visit | Attending: Internal Medicine | Admitting: Internal Medicine

## 2021-12-11 ENCOUNTER — Other Ambulatory Visit: Payer: Self-pay

## 2021-12-11 DIAGNOSIS — I5020 Unspecified systolic (congestive) heart failure: Secondary | ICD-10-CM | POA: Diagnosis not present

## 2021-12-15 NOTE — Addendum Note (Signed)
Addended by: Britt Bottom on: 12/15/2021 08:25 AM   Modules accepted: Orders

## 2021-12-28 ENCOUNTER — Other Ambulatory Visit (HOSPITAL_COMMUNITY): Payer: Self-pay

## 2021-12-28 ENCOUNTER — Telehealth (HOSPITAL_COMMUNITY): Payer: Self-pay | Admitting: Pharmacist

## 2021-12-28 NOTE — Telephone Encounter (Signed)
Advanced Heart Failure Patient Advocate Encounter  Prior Authorization for [employer] has been approved.    PA# [id number] Effective dates: 11/08/21 through 11/07/22  Patients co-pay is $3,624.00  Will start paperwork for patient assistance and enrollment application.   Karle Plumber, PharmD, BCPS, BCCP, CPP Heart Failure Clinic Pharmacist [phone]

## 2021-12-28 NOTE — Progress Notes (Signed)
Will start paperwork for [employer] enrollment. Thanks.

## 2021-12-29 NOTE — Telephone Encounter (Signed)
Sent application via docusign for patient's signature. Patient placed on PAN Amyloid wait list.

## 2021-12-29 NOTE — Telephone Encounter (Signed)
Called and left the patient a message to start [employer] application.

## 2021-12-29 NOTE — Telephone Encounter (Signed)
Advanced Heart Failure Patient Advocate Encounter  Sent in application via fax.  Will follow up.  

## 2021-12-30 NOTE — Progress Notes (Addendum)
Advanced Heart Failure Clinic Note    PCP: Manson Allan, MD PCP-Cardiologist: Kate Sable, MD  HF Cardiologist: Dr. Haroldine Laws  HPI: Tyler Ochoa. Tyler Ochoa is a 71 y.o. male with history of CAD, chronic systolic CHF/CM, alcohol use with cirrhosis and ascites, CKD II-III, HTN, HLD, recently discovered renal mass and right lung nodule (CT chest 10/22).   Cardiac history as follows:  Echo at PCP's office 01/27/2021: EF 25-30%, severe concentric LVH, severely reduced RV, severe BAE, mild MR. Findings on echo concerning for infiltrative CM. cMRI recommended.  Later admitted 02/02/21 with acute systolic CHF c/b hepatorenal syndrome. Diuresed with low-dose dopamine and IV lasix.   Admitted to [facility] 10/22 with a/c CHF. BNP 4339. Echo with EF < 20%, moderately reduced RV, mild MR. Diuresed with lasix gtt. Hs troponin up to 420. R/LHC 09/08/21 with CTO mid LAD, moderate disease Lcx and RCA, RA 6, PA 55/20 (32), PCWP 16, Fick CO 5.2/CI 2.3, PA sat 69%. Plan to consider viability study at later date once stable. Initiation of GDMT limited by hypotension.   cMRI 11/03/21: LVEF 12%, severely reduced RV with RVEF 18%, extensive LGE throughout LV myocardium and patchy LGE in RV, elevated T1 and ECV. 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 Moderate right and small left pleural effusions.  He was markedly volume overloaded 1/23. Lasix stopped, torsemide 40 started.   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  Today he returns for HF follow up with his girlfriend and sister. He is overall feeling fine. Swelling has improved. Had 1 episode of dizziness recently, he attributes this to low potassium. He uses a walker/WC due to knee OA but not SOB with ADLs or walking short distances. Denies abnormal bleeding, palpitations, CP, edema, or PND/Orthopnea. Appetite ok. No fever or chills. Weight at home 217 pounds. Taking all medications. No  further ETOH use (previously drank 18 beers/day for many years; quit 3/22).  FH: Mother had HF in her 50s. Brother passed from sudden death at age of 76.  Past Medical History:  Diagnosis Date   CHF (congestive heart failure) (HCC)    Chronic kidney disease    Hyperlipidemia    Hypertension    Peripheral edema    Current Outpatient Medications  Medication Sig Dispense Refill   acetaminophen (TYLENOL) 500 MG tablet Take 500-1,000 mg by mouth every 6 (six) hours as needed for mild pain or moderate pain.     aspirin EC 81 MG tablet Take 1 tablet (81 mg total) by mouth daily. Swallow whole. 30 tablet 5   dapagliflozin propanediol (FARXIGA) 10 MG TABS tablet Take 1 tablet (10 mg total) by mouth daily before breakfast. 30 tablet 3   potassium chloride SA (KLOR-CON) 20 MEQ tablet Take 1 tablet (20 mEq total) by mouth daily. 30 tablet 5   PROAIR HFA 108 (90 Base) MCG/ACT inhaler Inhale 1-2 puffs into the lungs every 6 (six) hours as needed for wheezing.     sacubitril-valsartan (ENTRESTO) 24-26 MG Take 1 tablet by mouth 2 (two) times daily.     spironolactone (ALDACTONE) 25 MG tablet Take 0.5 tablets (12.5 mg total) by mouth daily. 15 tablet 3   torsemide (DEMADEX) 20 MG tablet Take 1 tablet (20 mg total) by mouth daily. 180 tablet 3   No current facility-administered medications for this encounter.   No Known Allergies  Social History   Socioeconomic History   Marital status: Single    Spouse name: Not on file  Number of children: Not on file   Years of education: Not on file   Highest education level: Not on file  Occupational History   Not on file  Tobacco Use   Smoking status: Never   Smokeless tobacco: Never  Vaping Use   Vaping Use: Never used  Substance and Sexual Activity   Alcohol use: Not on file   Drug use: Not on file   Sexual activity: Not on file  Other Topics Concern   Not on file  Social History Narrative   ** Merged History Encounter **       Social  Determinants of Health   Financial Resource Strain: Not on file  Food Insecurity: Not on file  Transportation Needs: Not on file  Physical Activity: Not on file  Stress: Not on file  Social Connections: Not on file  Intimate Partner Violence: Not on file    No family history on file.  BP 132/90    Pulse 96    Ht 6\' 2"  (1.88 m)    Wt 96.7 kg (213 lb 3.2 oz)    SpO2 97%    BMI 27.37 kg/m   Wt Readings from Last 3 Encounters:  01/04/22 96.7 kg (213 lb 3.2 oz)  12/04/21 98.9 kg (218 lb)  11/23/21 103.5 kg (228 lb 4 oz)   PHYSICAL EXAM: General:  NAD. No resp difficulty, arrived in [facility] HEENT: Normal Neck: Supple. No JVD. Carotids 2+ bilat; no bruits. No lymphadenopathy or thryomegaly appreciated. Cor: PMI nondisplaced. Regular rate & rhythm. No rubs, gallops or murmurs. Lungs: Clear Abdomen: Soft, nontender, nondistended. No hepatosplenomegaly. No bruits or masses. Good bowel sounds. Extremities: No cyanosis, clubbing, rash, trace BLE edema Neuro: Alert & oriented x 3, cranial nerves grossly intact. Moves all 4 extremities w/o difficulty. Affect pleasant.  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 Moderate right and small left pleural effusions. - PYP (2/23) markedly positive. - NYHA II-III, volume looks good today. - Continue torsemide 20 daily. - Continue spiro 12.5 mg daily. - Continue Entresto 24/26 mg  bid. - Continue Farxiga 10 mg daily. - Add carvedilol 3,125 mg bid. - SPEP - polyclonal increase. No M-spike. Urine IFE negative. - PYP markedly positive. - Genetic testing +. Will need 1st degree relatives screened. See below. - Repeat echo ~ 6 months. - Check labs today.  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 - Myeloma panel negative. - Genetic testing +  Val142lle. - PYP 2/23 markedly positive. - PharmD assisting with grant for [employer]. - Refer to Dr. Broadus John for genetic counseling.  3. CAD - CTO LAD with collaterals, moderate disease Lcx and RCA - Managed medically. - No s/s angina - On aspirin. - Off statin with elevated LFTs.  4. CKD II - Last Scr 0.88 -> 1.47 - Labs today.  5. HTN - Add beta blocker as above. - Increase GDMT as  able.  6. HLD - Off statin per primary cardiologist given elevated LFTs.  7. Liver cirrhosis - Noted on US abdomen. - He is now abstinent.  Follow up with APP in 4-6 weeks with APP (increase Entresto and change torsemide to PRN) and in 6 months with Dr. Haroldine Laws + echo.  Maricela Bo Dushore, FNP 01/04/22   Dr. Haroldine Laws and myself spent greater than 50% of the (total minutes 45) visit spent in counseling/coordination of care regarding (pathophysiology of cardiac amyloidosis, therapies and genetic counseling).  Patient seen and examined with the above-signed Advanced Practice Provider and/or Housestaff. I personally reviewed laboratory data, imaging studies and relevant notes. I independently examined the patient and formulated the important aspects of the plan. I have edited the note to reflect any of my changes or salient points. I have personally discussed the plan with the patient and/or family.  71 y/o with CAD and systolic HF. 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 Clinically NYHA II-III. Volume status ok    General:  Well appearing. No resp difficulty HEENT: normal Neck: supple. no JVD. Carotids 2+ bilat; no bruits. No lymphadenopathy or thryomegaly appreciated. Cor: PMI nondisplaced. Regular rate & rhythm. No rubs, gallops or murmurs. Lungs: clear Abdomen: soft, nontender, nondistended. No hepatosplenomegaly. No bruits or masses. Good bowel sounds. Extremities: no cyanosis, clubbing, rash, trace edema Neuro: alert & orientedx3, cranial nerves grossly intact. moves all 4 extremities w/o difficulty. Affect pleasant  Long talk about mechnism and natural history of TTR cardiac amyloid as well as genetic repercussions. Also discussed need for aggressive GDMT.   We will start tafamadis. Refer to genetic counselor for familial testing.  Total time spent 45 minutes. Over half that time spent discussing above.   Glori Bickers, MD  9:32 AM

## 2021-12-31 ENCOUNTER — Other Ambulatory Visit (HOSPITAL_COMMUNITY): Payer: Self-pay

## 2021-12-31 NOTE — Telephone Encounter (Addendum)
Advanced Heart Failure Patient Advocate Encounter  The patient was approved for a [employer] grant that will help cover the cost of Vyndamax. Total amount awarded, $10,000. 12/01/21-11/30/22.  ID [id number] BIN [id number] PCN [id number]  Called and spoke with patient. He is aware that the medication will be mailed from our specialty pharmacy and they will call every month to assess refill adherence.   Archer Asa, CPhT

## 2022-01-04 ENCOUNTER — Encounter (HOSPITAL_COMMUNITY): Payer: Self-pay

## 2022-01-04 ENCOUNTER — Other Ambulatory Visit: Payer: Self-pay

## 2022-01-04 ENCOUNTER — Ambulatory Visit (HOSPITAL_COMMUNITY)
Admission: RE | Admit: 2022-01-04 | Discharge: 2022-01-04 | Disposition: A | Discharge status: Home / Self Care | Payer: Medicare HMO | Source: Ambulatory Visit | Attending: Family Medicine | Admitting: Family Medicine

## 2022-01-04 VITALS — BP 132/90 | HR 96 | Ht 74.0 in | Wt 213.2 lb

## 2022-01-04 DIAGNOSIS — J90 Pleural effusion, not elsewhere classified: Secondary | ICD-10-CM | POA: Insufficient documentation

## 2022-01-04 DIAGNOSIS — Z7984 Long term (current) use of oral hypoglycemic drugs: Secondary | ICD-10-CM | POA: Insufficient documentation

## 2022-01-04 DIAGNOSIS — Z79899 Other long term (current) drug therapy: Secondary | ICD-10-CM | POA: Insufficient documentation

## 2022-01-04 DIAGNOSIS — E785 Hyperlipidemia, unspecified: Secondary | ICD-10-CM | POA: Insufficient documentation

## 2022-01-04 DIAGNOSIS — M179 Osteoarthritis of knee, unspecified: Secondary | ICD-10-CM | POA: Insufficient documentation

## 2022-01-04 DIAGNOSIS — E854 Organ-limited amyloidosis: Secondary | ICD-10-CM | POA: Diagnosis not present

## 2022-01-04 DIAGNOSIS — Z7982 Long term (current) use of aspirin: Secondary | ICD-10-CM | POA: Insufficient documentation

## 2022-01-04 DIAGNOSIS — I43 Cardiomyopathy in diseases classified elsewhere: Secondary | ICD-10-CM | POA: Insufficient documentation

## 2022-01-04 DIAGNOSIS — K7460 Unspecified cirrhosis of liver: Secondary | ICD-10-CM | POA: Insufficient documentation

## 2022-01-04 DIAGNOSIS — N182 Chronic kidney disease, stage 2 (mild): Secondary | ICD-10-CM | POA: Insufficient documentation

## 2022-01-04 DIAGNOSIS — I2510 Atherosclerotic heart disease of native coronary artery without angina pectoris: Secondary | ICD-10-CM | POA: Insufficient documentation

## 2022-01-04 DIAGNOSIS — I5022 Chronic systolic (congestive) heart failure: Secondary | ICD-10-CM | POA: Diagnosis not present

## 2022-01-04 DIAGNOSIS — Z7901 Long term (current) use of anticoagulants: Secondary | ICD-10-CM | POA: Diagnosis not present

## 2022-01-04 DIAGNOSIS — I10 Essential (primary) hypertension: Secondary | ICD-10-CM

## 2022-01-04 DIAGNOSIS — I130 Hypertensive heart and chronic kidney disease with heart failure and stage 1 through stage 4 chronic kidney disease, or unspecified chronic kidney disease: Secondary | ICD-10-CM | POA: Diagnosis not present

## 2022-01-04 DIAGNOSIS — R188 Other ascites: Secondary | ICD-10-CM

## 2022-01-04 LAB — BASIC METABOLIC PANEL
Anion gap: 13 (ref 5–15)
BUN: 25 mg/dL — ABNORMAL HIGH (ref 8–23)
CO2: 25 mmol/L (ref 22–32)
Calcium: 9.6 mg/dL (ref 8.9–10.3)
Chloride: 98 mmol/L (ref 98–111)
Creatinine, Ser: 1.51 mg/dL — ABNORMAL HIGH (ref 0.61–1.24)
GFR, Estimated: 50 mL/min — ABNORMAL LOW (ref >60)
Glucose, Bld: 96 mg/dL (ref 70–99)
Potassium: 4.2 mmol/L (ref 3.5–5.1)
Sodium: 136 mmol/L (ref 135–145)

## 2022-01-04 LAB — MAGNESIUM: Magnesium: 2.4 mg/dL (ref 1.7–2.4)

## 2022-01-04 MED ORDER — CARVEDILOL 3.125 MG PO TABS: 3.1250 mg | ORAL_TABLET | Freq: Two times a day (BID) | ORAL | 5 refills | Status: DC

## 2022-01-04 NOTE — Patient Instructions (Addendum)
Thank you for coming in today  Labs were done today, if any labs are abnormal the clinic will call you  START Carvedilol 3.125 mg 1 tablet twice daily   Your physician recommends that you schedule a follow-up appointment in:  4-6 weeks in clinic 6 months with Dr. Haroldine Laws with echocardiogram  Your physician has requested that you have an echocardiogram. Echocardiography is a painless test that uses sound waves to create images of your heart. It provides your doctor with information about the size and shape of your heart and how well your hearts chambers and valves are working. This procedure takes approximately one hour. There are no restrictions for this procedure.     At the [facility], you and your health needs are our priority. As part of our continuing mission to provide you with exceptional heart care, we have created designated Provider Care Teams. These Care Teams include your primary Cardiologist (physician) and Advanced Practice Providers (APPs- Physician Assistants and Nurse Practitioners) who all work together to provide you with the care you need, when you need it.   You may see any of the following providers on your designated Care Team at your next follow up: Dr Glori Bickers Dr Haynes Kerns, NP Lyda Jester, [place] [facility] [place], [place] Audry Riles, PharmD   Please be sure to bring in all your medications bottles to every appointment.   If you have any questions or concerns before your next appointment please send [place] a message through [place] or call our office at [phone].    TO LEAVE A MESSAGE FOR THE NURSE SELECT OPTION 2, PLEASE LEAVE A MESSAGE INCLUDING: YOUR NAME DATE OF BIRTH CALL BACK NUMBER REASON FOR CALL**this is important as we prioritize the call backs  YOU WILL RECEIVE A CALL BACK THE SAME DAY AS LONG AS YOU CALL BEFORE 4:00 PM

## 2022-01-05 ENCOUNTER — Other Ambulatory Visit (HOSPITAL_COMMUNITY): Payer: Self-pay

## 2022-01-05 ENCOUNTER — Telehealth (HOSPITAL_COMMUNITY): Payer: Self-pay | Admitting: Pharmacist

## 2022-01-05 MED ORDER — VYNDAMAX 61 MG PO CAPS
1.0000 | ORAL_CAPSULE | Freq: Every day | ORAL | 11 refills | Status: DC
Start: 2022-01-05 — End: 2022-12-15
  Filled 2022-01-05: qty 30, fill #0
  Filled 2022-01-06 (×2): qty 30, 30d supply, fill #0
  Filled 2022-02-02: qty 30, 30d supply, fill #1
  Filled 2022-02-23: qty 30, 30d supply, fill #2
  Filled 2022-03-26 – 2022-03-31 (×4): qty 30, 30d supply, fill #3
  Filled 2022-04-23: qty 30, 30d supply, fill #4
  Filled 2022-05-19: qty 30, 30d supply, fill #5
  Filled 2022-06-21: qty 30, 30d supply, fill #6
  Filled 2022-07-22: qty 30, 30d supply, fill #7
  Filled 2022-08-17: qty 30, 30d supply, fill #8
  Filled 2022-09-20: qty 30, 30d supply, fill #9
  Filled 2022-10-20: qty 30, 30d supply, fill #10
  Filled 2022-11-22: qty 30, 30d supply, fill #11

## 2022-01-05 NOTE — Addendum Note (Signed)
Encounter addended by: Dolores Patty, MD on: 01/05/2022 9:32 AM  Actions taken: Clinical Note Signed, Level of Service modified

## 2022-01-05 NOTE — Telephone Encounter (Signed)
Vyndamax prescription sent to [facility]. ° °Mallika Sanmiguel, PharmD, BCPS, BCCP, CPP °Heart Failure Clinic Pharmacist °[phone] ° °

## 2022-01-06 ENCOUNTER — Other Ambulatory Visit (HOSPITAL_COMMUNITY): Payer: Self-pay

## 2022-02-01 ENCOUNTER — Ambulatory Visit (HOSPITAL_BASED_OUTPATIENT_CLINIC_OR_DEPARTMENT_OTHER)
Admission: RE | Admit: 2022-02-01 | Discharge: 2022-02-01 | Disposition: A | Discharge status: Home / Self Care | Payer: Medicare HMO | Source: Ambulatory Visit | Attending: Family Medicine | Admitting: Family Medicine

## 2022-02-01 ENCOUNTER — Encounter (HOSPITAL_COMMUNITY): Payer: Self-pay

## 2022-02-01 ENCOUNTER — Other Ambulatory Visit: Payer: Self-pay

## 2022-02-01 ENCOUNTER — Ambulatory Visit (HOSPITAL_COMMUNITY)
Admission: RE | Admit: 2022-02-01 | Discharge: 2022-02-01 | Disposition: A | Discharge status: Home / Self Care | Payer: Medicare HMO | Source: Ambulatory Visit | Attending: Family Medicine | Admitting: Family Medicine

## 2022-02-01 VITALS — BP 118/76 | HR 77 | Wt 233.8 lb

## 2022-02-01 DIAGNOSIS — Z7982 Long term (current) use of aspirin: Secondary | ICD-10-CM | POA: Insufficient documentation

## 2022-02-01 DIAGNOSIS — N182 Chronic kidney disease, stage 2 (mild): Secondary | ICD-10-CM | POA: Diagnosis not present

## 2022-02-01 DIAGNOSIS — E854 Organ-limited amyloidosis: Secondary | ICD-10-CM | POA: Insufficient documentation

## 2022-02-01 DIAGNOSIS — I5023 Acute on chronic systolic (congestive) heart failure: Secondary | ICD-10-CM | POA: Diagnosis present

## 2022-02-01 DIAGNOSIS — I130 Hypertensive heart and chronic kidney disease with heart failure and stage 1 through stage 4 chronic kidney disease, or unspecified chronic kidney disease: Secondary | ICD-10-CM | POA: Diagnosis not present

## 2022-02-01 DIAGNOSIS — I2510 Atherosclerotic heart disease of native coronary artery without angina pectoris: Secondary | ICD-10-CM | POA: Diagnosis not present

## 2022-02-01 DIAGNOSIS — E785 Hyperlipidemia, unspecified: Secondary | ICD-10-CM | POA: Diagnosis not present

## 2022-02-01 DIAGNOSIS — I5022 Chronic systolic (congestive) heart failure: Secondary | ICD-10-CM

## 2022-02-01 DIAGNOSIS — Z79899 Other long term (current) drug therapy: Secondary | ICD-10-CM | POA: Insufficient documentation

## 2022-02-01 DIAGNOSIS — I10 Essential (primary) hypertension: Secondary | ICD-10-CM

## 2022-02-01 DIAGNOSIS — K7460 Unspecified cirrhosis of liver: Secondary | ICD-10-CM | POA: Insufficient documentation

## 2022-02-01 DIAGNOSIS — R188 Other ascites: Secondary | ICD-10-CM

## 2022-02-01 DIAGNOSIS — I43 Cardiomyopathy in diseases classified elsewhere: Secondary | ICD-10-CM | POA: Insufficient documentation

## 2022-02-01 LAB — BASIC METABOLIC PANEL
Anion gap: 7 (ref 5–15)
BUN: 30 mg/dL — ABNORMAL HIGH (ref 8–23)
CO2: 23 mmol/L (ref 22–32)
Calcium: 9.5 mg/dL (ref 8.9–10.3)
Chloride: 104 mmol/L (ref 98–111)
Creatinine, Ser: 1.82 mg/dL — ABNORMAL HIGH (ref 0.61–1.24)
GFR, Estimated: 40 mL/min — ABNORMAL LOW (ref >60)
Glucose, Bld: 96 mg/dL (ref 70–99)
Potassium: 4.8 mmol/L (ref 3.5–5.1)
Sodium: 134 mmol/L — ABNORMAL LOW (ref 135–145)

## 2022-02-01 LAB — ECHOCARDIOGRAM COMPLETE: S' Lateral: 4.4 cm

## 2022-02-01 LAB — BRAIN NATRIURETIC PEPTIDE: B Natriuretic Peptide: 1807.4 pg/mL — ABNORMAL HIGH (ref 0.0–100.0)

## 2022-02-01 MED ORDER — TORSEMIDE 20 MG PO TABS: 40.0000 mg | ORAL_TABLET | Freq: Every day | ORAL | 3 refills | Status: DC

## 2022-02-01 MED ORDER — POTASSIUM CHLORIDE CRYS ER 20 MEQ PO TBCR: 40.0000 meq | EXTENDED_RELEASE_TABLET | Freq: Every day | ORAL | 5 refills | Status: DC

## 2022-02-01 NOTE — Progress Notes (Signed)
ReDS Vest / Clip - 02/01/22 1500   ? ?  ? ReDS Vest / Clip  ? Station Marker D   ? Ruler Value 33   ? ReDS Value Range Low volume   ? ReDS Actual Value 32   ? ?  ?  ? ?  ? ? ?

## 2022-02-01 NOTE — Progress Notes (Signed)
?Advanced Heart Failure Clinic Note  ? ? ?PCP: Manson Allan, MD ?PCP-Cardiologist: Kate Sable, MD  ?HF Cardiologist: Dr. Haroldine Laws ? ?HPI: ?Tyler Ochoa is a 71 y.o. male with history of CAD, chronic systolic CHF/CM, alcohol use with cirrhosis and ascites, CKD II-III, HTN, HLD, recently discovered renal mass and right lung nodule (CT chest 10/22).  ? ?Cardiac history as follows: ? ?Echo at PCP's office 01/27/2021: EF 25-30%, severe concentric LVH, severely reduced RV, severe BAE, mild MR. Findings on echo concerning for infiltrative CM. cMRI recommended. ? ?Later admitted 02/02/21 with acute systolic CHF c/b hepatorenal syndrome. Diuresed with low-dose dopamine and IV lasix.  ? ?Admitted to [facility] 10/22 with a/c CHF. BNP 4339. Echo with EF < 20%, moderately reduced RV, mild MR. Diuresed with lasix gtt. Hs troponin up to 420. R/LHC 09/08/21 with CTO mid LAD, moderate disease Lcx and RCA, RA 6, PA 55/20 (32), PCWP 16, Fick CO 5.2/CI 2.3, PA sat 69%. Plan to consider viability study at later date once stable. Initiation of GDMT limited by hypotension.  ? ?cMRI 11/03/21: LVEF 12%, severely reduced RV with RVEF 18%, extensive LGE throughout LV myocardium and patchy LGE in RV, elevated T1 and ECV. 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 Moderate right and small left pleural effusions. ? ?He was markedly volume overloaded 1/23. Lasix stopped, torsemide 40 started.  ? 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  ? ?Today he returns for HF follow up with his sister and girlfriend. Overall feeling fine. Noticed more LE swelling. He increased his torsemide for a day or two and swelling improved. Eating a lot of sweets. Uses a walker and cane due to knee OA. No significant dyspnea with ADLs or walking short distances on flat ground. Denies palpitations, CP, dizziness, or PND/Orthopnea. Appetite ok. No fever or chills. Weight at home 229 pounds.  Taking all medications. No further ETOH use (previously drank 18 beers/day for many years; quit 3/22). ? ? ?FH: Mother had HF in her 41s. Brother passed from sudden death at age of 92. ? ?Past Medical History:  ?Diagnosis Date  ? CHF (congestive heart failure) ([place])   ? Chronic kidney disease   ? Hyperlipidemia   ? Hypertension   ? Peripheral edema   ? ?Current Outpatient Medications  ?Medication Sig Dispense Refill  ? acetaminophen (TYLENOL) 500 MG tablet Take 500-1,000 mg by mouth every 6 (six) hours as needed for mild pain or moderate pain.    ? aspirin EC 81 MG tablet Take 1 tablet (81 mg total) by mouth daily. Swallow whole. 30 tablet 5  ? carvedilol (COREG) 3.125 MG tablet Take 1 tablet (3.125 mg total) by mouth 2 (two) times daily with a meal. 60 tablet 5  ? dapagliflozin propanediol (FARXIGA) 10 MG TABS tablet Take 1 tablet (10 mg total) by mouth daily before breakfast. 30 tablet 3  ? potassium chloride SA (KLOR-CON) 20 MEQ tablet Take 1 tablet (20 mEq total) by mouth daily. 30 tablet 5  ? PROAIR HFA 108 (90 Base) MCG/ACT inhaler Inhale 1-2 puffs into the lungs every 6 (six) hours as needed for wheezing.    ? sacubitril-valsartan (ENTRESTO) 24-26 MG Take 1 tablet by mouth 2 (two) times daily.    ? spironolactone (ALDACTONE) 25 MG tablet Take 0.5 tablets (12.5 mg total) by mouth daily. 15 tablet 3  ? Tafamidis (VYNDAMAX) 61 MG CAPS Take 1 capsule by mouth daily. 30 capsule 11  ? torsemide (DEMADEX)  20 MG tablet Take 1 tablet (20 mg total) by mouth daily. 180 tablet 3  ? ?No current facility-administered medications for this encounter.  ? ?No Known Allergies ? ?Social History  ? ?Socioeconomic History  ? Marital status: Single  ?  Spouse name: Not on file  ? Number of children: Not on file  ? Years of education: Not on file  ? Highest education level: Not on file  ?Occupational History  ? Not on file  ?Tobacco Use  ? Smoking status: Never  ? Smokeless tobacco: Never  ?Vaping Use  ? Vaping Use: Never used   ?Substance and Sexual Activity  ? Alcohol use: Not on file  ? Drug use: Not on file  ? Sexual activity: Not on file  ?Other Topics Concern  ? Not on file  ?Social History Narrative  ? ** Merged History Encounter **  ?    ? ?Social Determinants of Health  ? ?Financial Resource Strain: Not on file  ?Food Insecurity: Not on file  ?Transportation Needs: Not on file  ?Physical Activity: Not on file  ?Stress: Not on file  ?Social Connections: Not on file  ?Intimate Partner Violence: Not on file  ? ? No family history on file. ? ?BP 118/76   Pulse 77   Wt 106.1 kg (233 lb 12.8 oz)   SpO2 98%   BMI 30.02 kg/m?  ? ?Wt Readings from Last 3 Encounters:  ?02/01/22 106.1 kg (233 lb 12.8 oz)  ?01/04/22 96.7 kg (213 lb 3.2 oz)  ?12/04/21 98.9 kg (218 lb)  ? ?PHYSICAL EXAM: ?General:  NAD. No resp difficulty, arrived in [facility] ?HEENT: Normal ?Neck: Supple. JVP 6-7. Carotids 2+ bilat; no bruits. No lymphadenopathy or thryomegaly appreciated. ?Cor: PMI nondisplaced. Regular rate & rhythm. No rubs, gallops or murmurs. ?Lungs: Clear ?Abdomen: Soft, nontender, nondistended. No hepatosplenomegaly. No bruits or masses. Good bowel sounds. ?Extremities: No cyanosis, clubbing, rash, 2+ BLEedema ?Neuro: Alert & oriented x 3, cranial nerves grossly intact. Moves all 4 extremities w/o difficulty. Affect pleasant. ? ?ReDs: 33% ? 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 Findings felt to be most consistent with cardiac  amyloidosis. Moderate right and small left pleural effusions. ?- PYP (2/23) markedly positive. ?- NYHA II-III, volume up today, eating more sweets. ReDs 33%. ?- Increase torsemide to 40 mg daily + extra 20 KCL. ?- Continue spiro 12.5 mg daily. ?- Continue Entresto 24/26 mg bid. ?- Continue Farxiga 10 mg daily. ?- Continue carvedilol 3.125 mg bid. ?- SPEP - polyclonal increase. No M-spike. Urine IFE negative. ?- PYP markedly positive. ?- Genetic testing +. Will need 1st degree relatives screened. See below. ?- Repeat echo ~ 6 months. ?- BMET/BNP today, repeat BMET in 7-10 days (ok to go to [facility]) ? ?2. Cardiac amyloidosis ?- LGE pattern on cMRI appears consistent with cardiac amyloidosis. ?- Myeloma panel negative. ?- Genetic testing +  Val142lle. ?- PYP 2/23 markedly positive. ?- On Vyndamax. ?- Referred to Dr. Jomarie Longs for  genetic counseling, has visit in June. ? ?3. CAD ?- CTO LAD with collaterals, moderate disease Lcx and RCA ?- Managed medically. ?- No s/s angina. ?- On aspirin. ?- Off statin with elevated LFTs. ? ?4. CKD II ?- Last Scr 0.88 -> 1.47 ?- Labs today. ? ?5. HTN ?- Stable. ?- Increase GDMT as able. ? ?6. HLD ?- Off statin per primary cardiologist given elevated LFTs. ? ?7. Liver cirrhosis ?- Noted on US abdomen. ?- He is now abstinent. ? ?Follow up with APP in 3-4 weeks (increase Entresto and change torsemide to PRN) and in 6 months with Dr. Haroldine Laws + echo. ? ?Rafael Bihari, FNP ?02/01/22  ? ?

## 2022-02-01 NOTE — Patient Instructions (Signed)
Thank you for coming in today ? ?Labs were done today, if any labs are abnormal the clinic will call you ?No news is good news ? ?INCREASE Torsemide to 40 mg 2 tablets daily  ?INCREASE Potassium to 40 meq 2 tablets daily  ? ?Your physician recommends that you schedule a follow-up appointment in:  ?3-4 weeks in APP ?6 months with Dr. Gala Romney ? ?At the Advanced Heart Failure Clinic, you and your health needs are our priority. As part of our continuing mission to provide you with exceptional heart care, we have created designated Provider Care Teams. These Care Teams include your primary Cardiologist (physician) and Advanced Practice Providers (APPs- Physician Assistants and Nurse Practitioners) who all work together to provide you with the care you need, when you need it.  ? ?You may see any of the following providers on your designated Care Team at your next follow up: ?Dr Arvilla Meres ?Dr Marca Ancona ?Tonye Becket, NP ?Robbie Lis, PA ?Jessica Milford,NP ?Anna Genre, PA ?Karle Plumber, PharmD ? ? ?Please be sure to bring in all your medications bottles to every appointment.  ? ?If you have any questions or concerns before your next appointment please send [place] a message through [place] or call our office at [phone].   ? ?TO LEAVE A MESSAGE FOR THE NURSE SELECT OPTION 2, PLEASE LEAVE A MESSAGE INCLUDING: ?YOUR NAME ?DATE OF BIRTH ?CALL BACK NUMBER ?REASON FOR CALL**this is important as we prioritize the call backs ? ?YOU WILL RECEIVE A CALL BACK THE SAME DAY AS LONG AS YOU CALL BEFORE 4:00 PM ? ?

## 2022-02-02 ENCOUNTER — Other Ambulatory Visit (HOSPITAL_COMMUNITY): Payer: Self-pay

## 2022-02-02 ENCOUNTER — Telehealth (HOSPITAL_COMMUNITY): Payer: Self-pay | Admitting: Licensed Clinical Social Worker

## 2022-02-02 NOTE — Telephone Encounter (Signed)
CSW received request to follow up with patient regarding financial concerns with medical bills. CSW spoke with patient who shared his income and financial struggles. CSW provided the contact information for [facility] to inquire about eligibility for extra help program as well as [facility] [employer] to assist with other options for medical bills. Patient grateful for the information and will return call if further assistance is needed. Raquel Sarna, [place], [place] ? ?

## 2022-02-03 ENCOUNTER — Other Ambulatory Visit (HOSPITAL_COMMUNITY): Payer: Self-pay

## 2022-02-04 ENCOUNTER — Telehealth (HOSPITAL_COMMUNITY): Payer: Self-pay

## 2022-02-04 NOTE — Telephone Encounter (Addendum)
Pt aware, agreeable, and verbalized understanding ? ? ?----- Message from Jacklynn Ganong, FNP sent at 02/02/2022  7:53 AM EDT ----- ?Mild improvement in pumping function, EF up to 30-35%. Will repeat in 6 months. ?

## 2022-02-07 IMAGING — DX DG CHEST 1V PORT
2 series · 2 of 2 positions shown · non-contrast
Comparison: February 02, 2021

CLINICAL DATA: Increasing weakness x2 weeks.

EXAM:
PORTABLE CHEST 1 VIEW

[chest ap (1 of 2)]
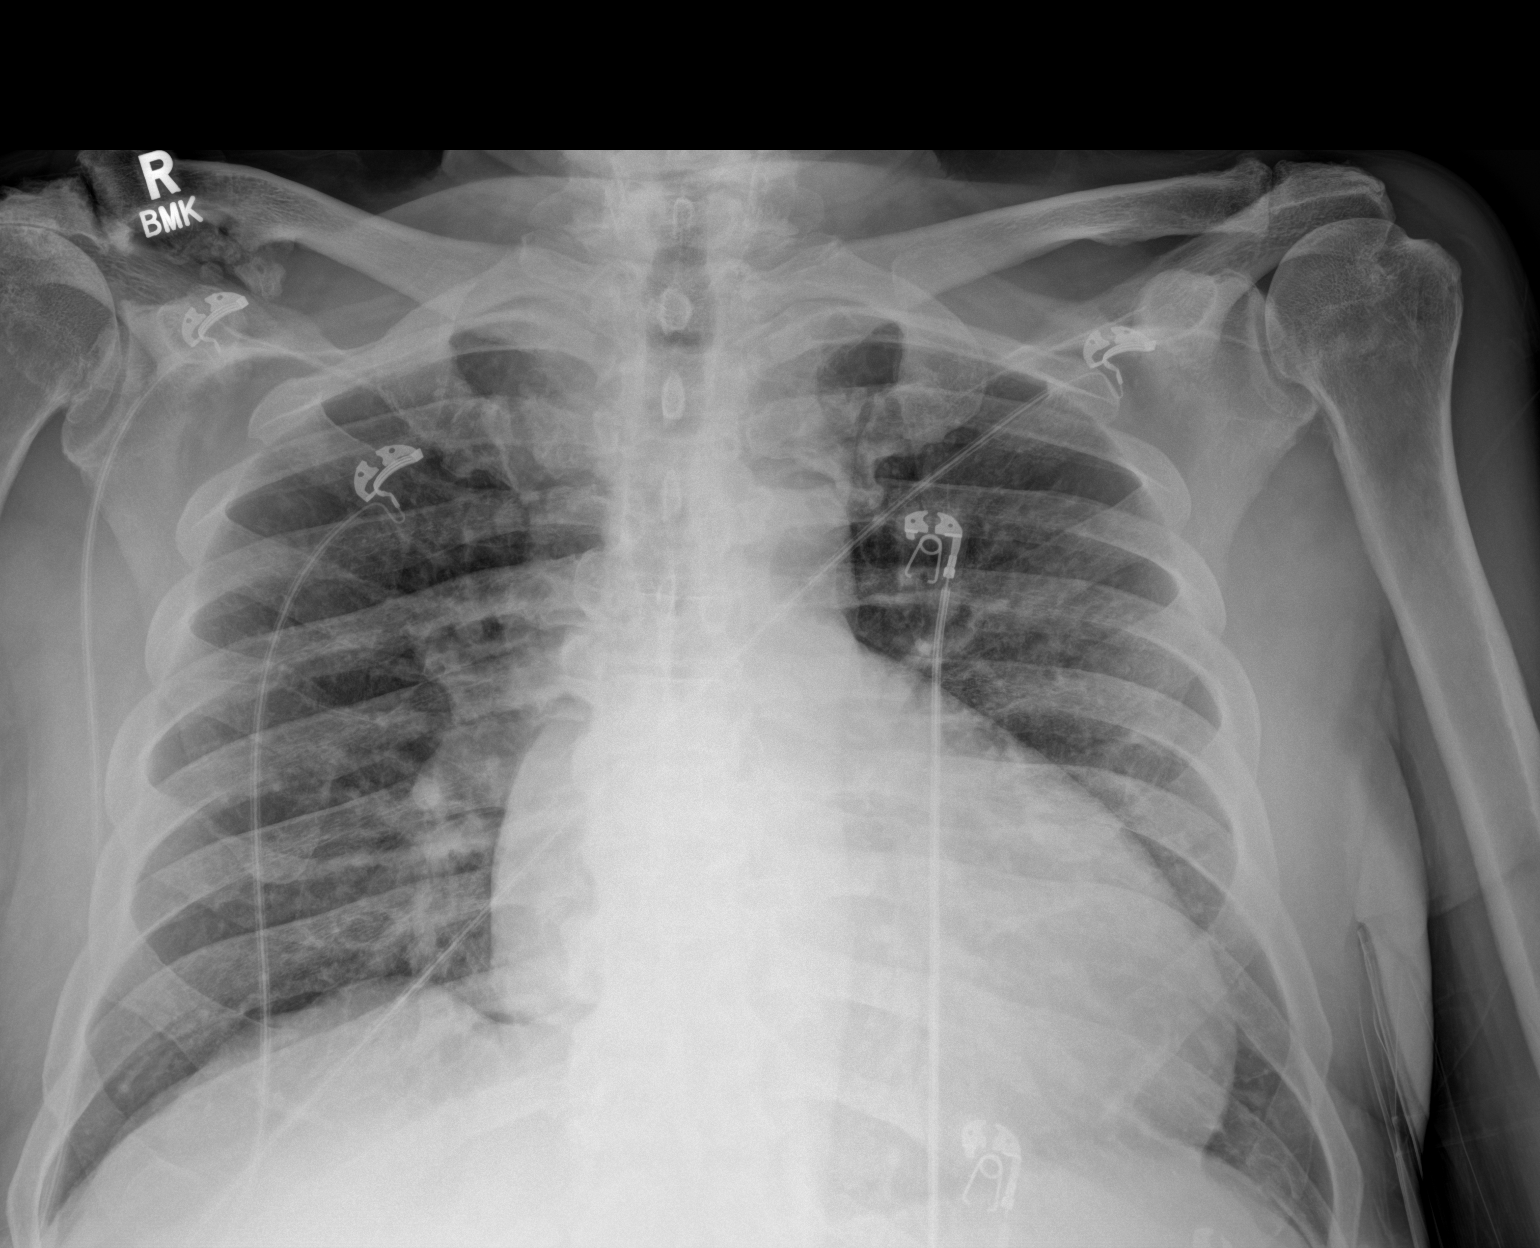

[chest ap (2 of 2)]
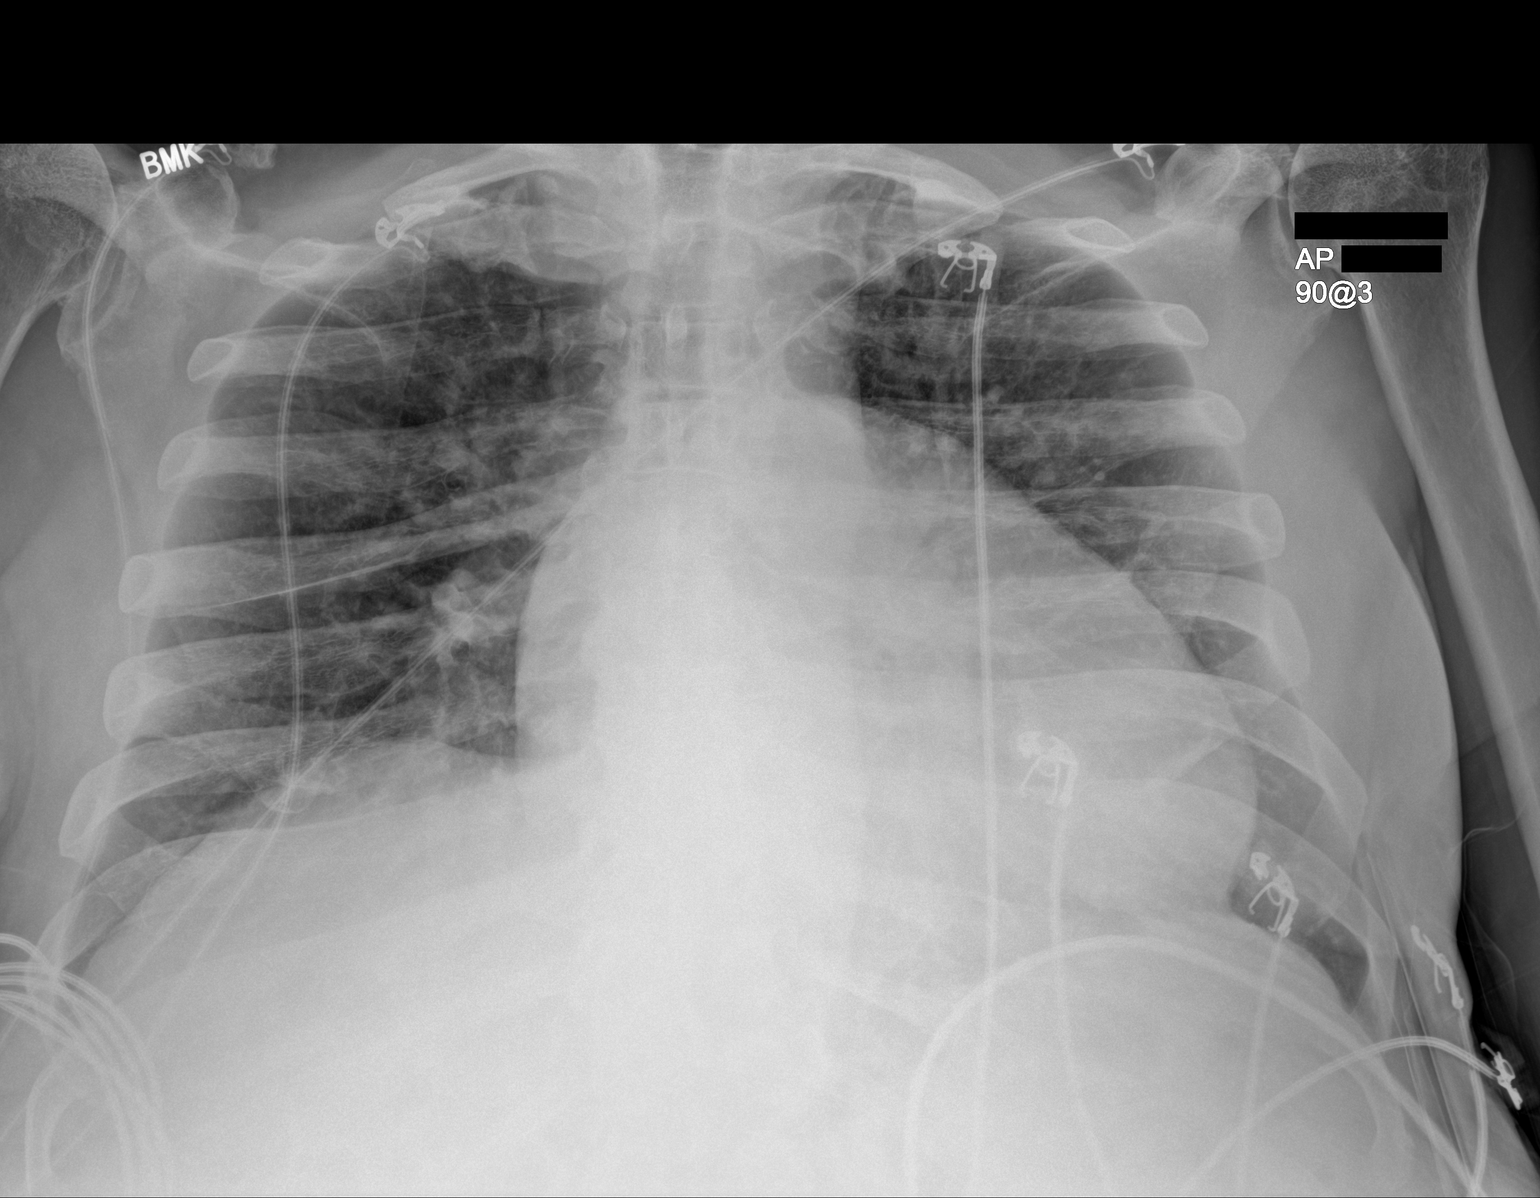

[2 of 2 positions shown; findings below may reference images not displayed]

FINDINGS: The cardiac silhouette is moderately enlarged and unchanged in size.
Both lungs are clear. Degenerative changes are seen throughout the
thoracic spine.
IMPRESSION: Stable cardiomegaly without acute or active cardiopulmonary disease.

## 2022-02-08 IMAGING — US US EXTREM LOW VENOUS
1 series · 14 of 24 positions shown · non-contrast
Comparison: None.

CLINICAL DATA: Extremity swelling

EXAM:
BILATERAL LOWER EXTREMITY VENOUS DOPPLER ULTRASOUND
TECHNIQUE: Gray-scale sonography with compression, as well as color and duplex
ultrasound, were performed to evaluate the deep venous system(s)
from the level of the common femoral vein through the popliteal and
proximal calf veins.

[Series 1: us venous img lower bilat (dvt) · portal-venous · 14 of 56 slices shown]
[im 1/56]
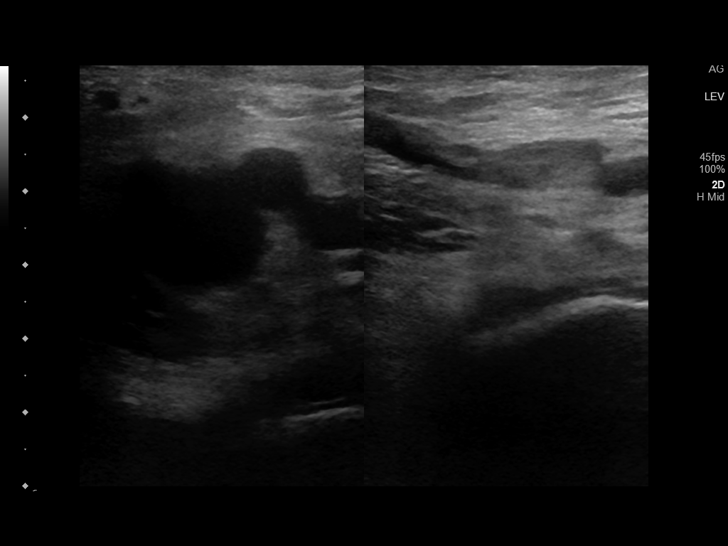
[im 5/56]
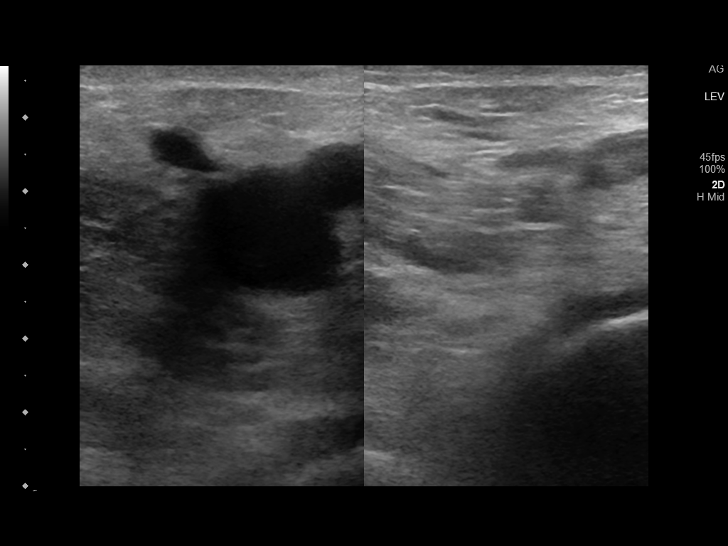
[im 10/56]
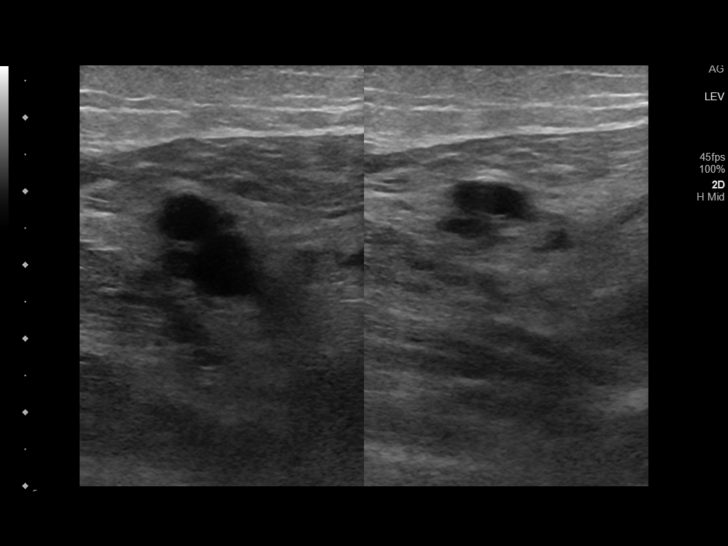
[im 15/56]
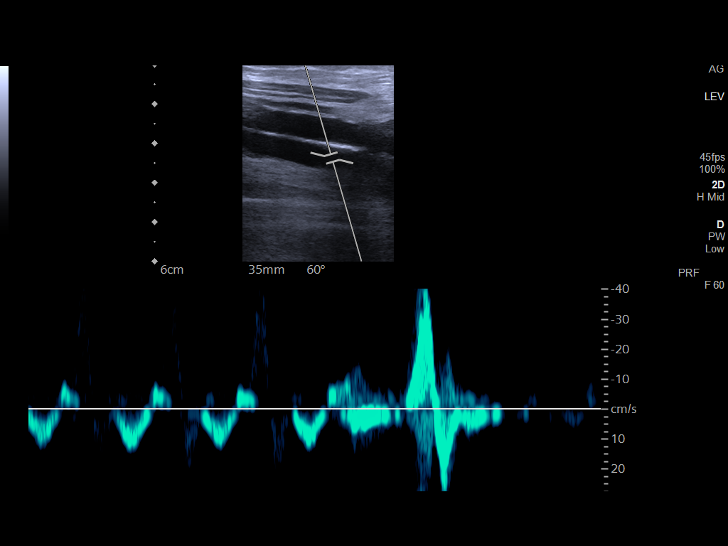
[im 17/56]
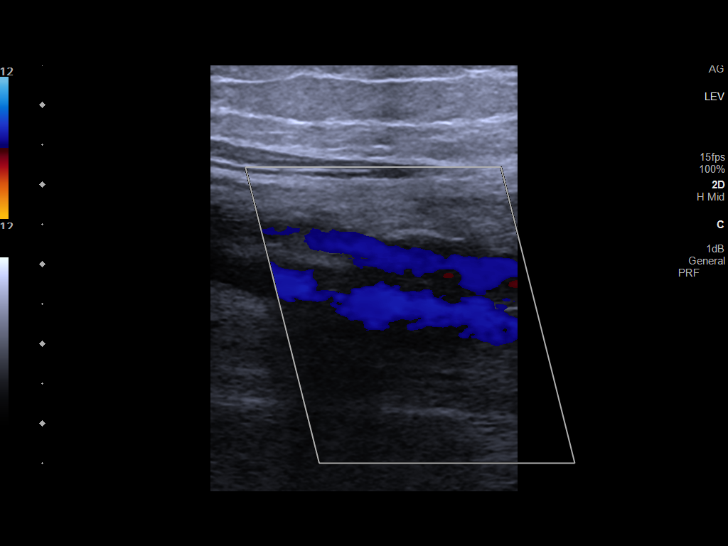
[im 22/56]
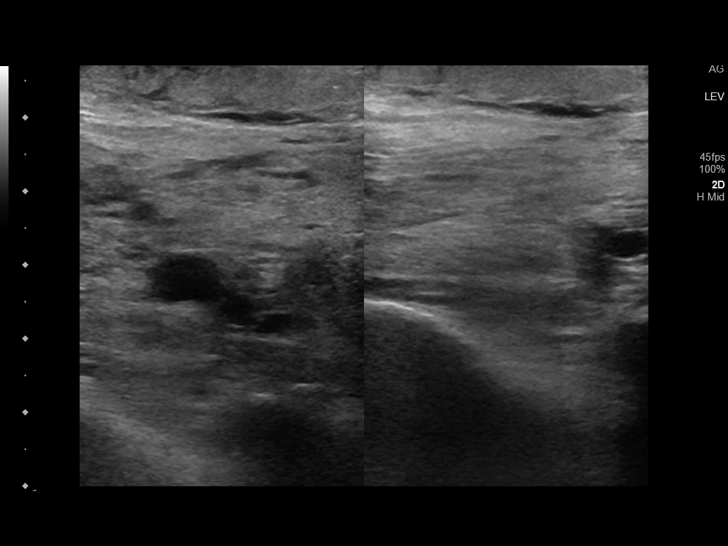
[im 27/56]
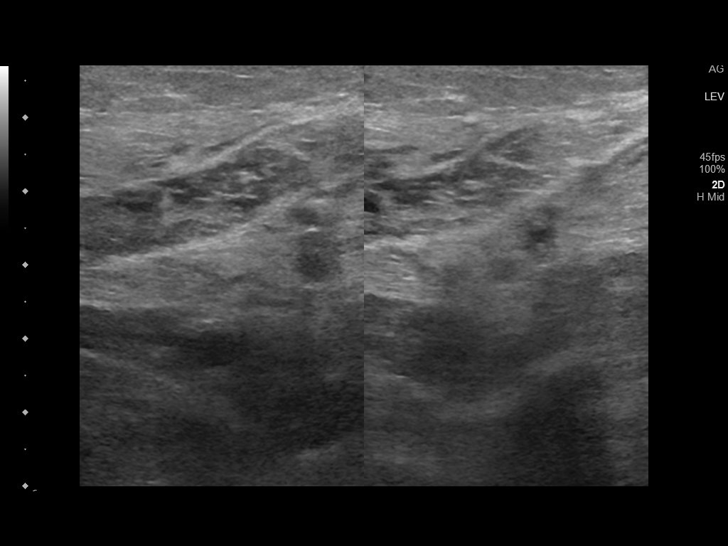
[im 29/56]
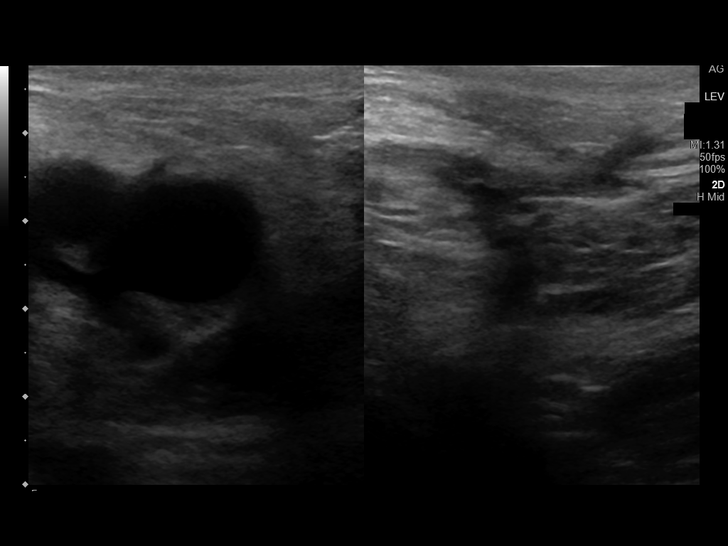
[im 34/56]
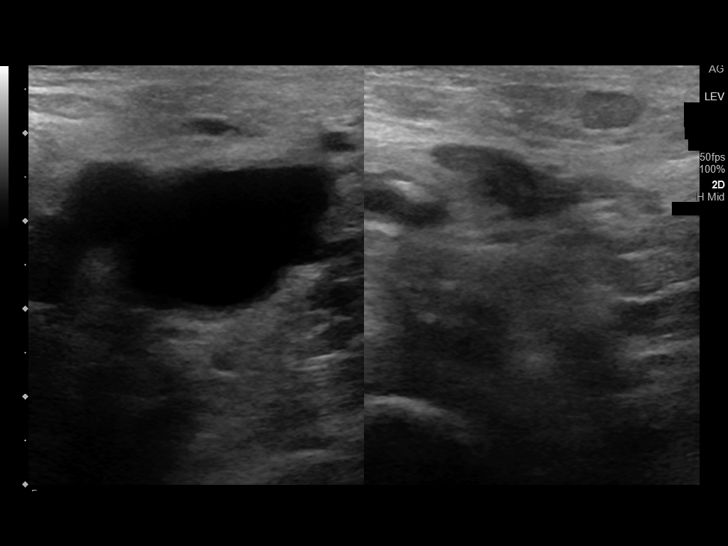
[im 39/56]
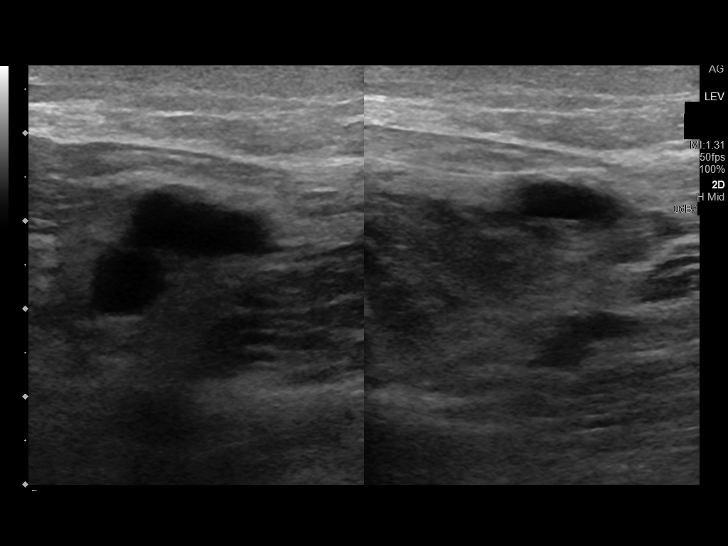
[im 44/56]
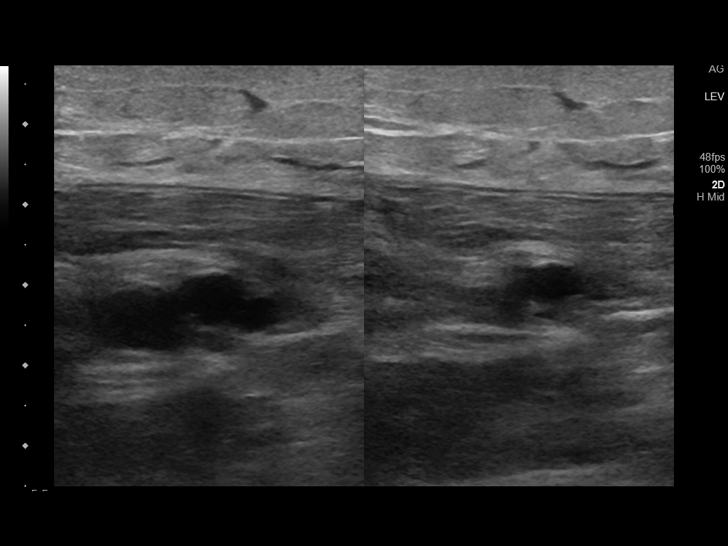
[im 46/56]
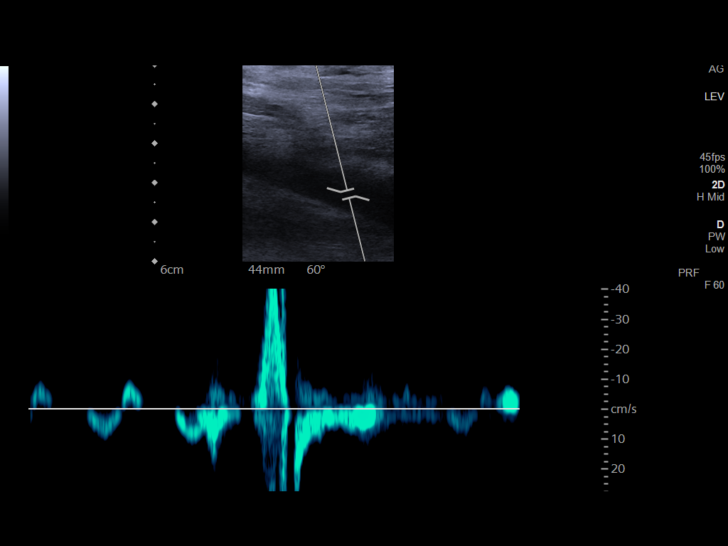
[im 51/56]
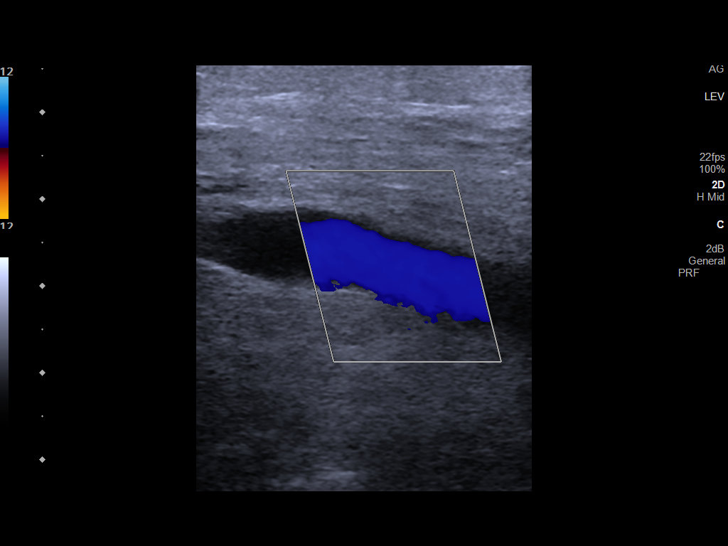
[im 56/56]
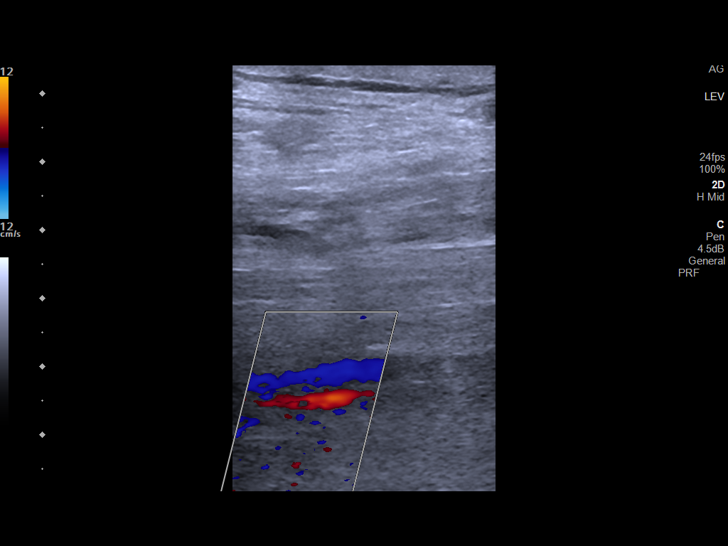

[14 of 24 positions shown; findings below may reference images not displayed]

FINDINGS: VENOUS

Normal compressibility of the common femoral, superficial femoral,
and popliteal veins, as well as the visualized calf veins.
Visualized portions of profunda femoral vein and great saphenous
vein unremarkable. No filling defects to suggest DVT on grayscale or
color Doppler imaging. Doppler waveforms show normal direction of
venous flow, normal respiratory plasticity and response to
augmentation.

Limited views of the contralateral common femoral vein are
unremarkable.

OTHER

Soft tissue edema bilaterally.

Limitations: none
IMPRESSION: Negative.

## 2022-02-11 ENCOUNTER — Other Ambulatory Visit (HOSPITAL_COMMUNITY): Payer: Self-pay

## 2022-02-12 ENCOUNTER — Telehealth (HOSPITAL_COMMUNITY): Payer: Self-pay | Admitting: Pharmacy Technician

## 2022-02-12 NOTE — Telephone Encounter (Signed)
Advanced Heart Failure Patient Advocate Encounter ?  ?The [employer] Amyloid grant has opened to waitlist applicants. Will know in 4 business days if patient was approved.  ?

## 2022-02-17 ENCOUNTER — Other Ambulatory Visit (HOSPITAL_COMMUNITY): Payer: Self-pay

## 2022-02-17 NOTE — Telephone Encounter (Signed)
Advanced Heart Failure Patient Advocate Encounter ? ?The patient was approved for a [employer] HF grant that will help cover the cost of Vyndamax. Total amount awarded, $1200. Eligibility dates, 02/15/22 - 02/15/23. Billing information added to [facility]. ?  ?BIN [id number] ?PCN PANF ?ID [id number] ?Group [id number] ? ?Archer Asa, CPhT ? ? ?

## 2022-02-23 ENCOUNTER — Other Ambulatory Visit (HOSPITAL_COMMUNITY): Payer: Self-pay

## 2022-02-25 NOTE — Progress Notes (Signed)
?Advanced Heart Failure Clinic Note  ? ? ?PCP: Queen SloughZhang, Jaimei, MD ?PCP-Cardiologist: Debbe OdeaBrian Agbor-Etang, MD  ?HF Cardiologist: Dr. Gala RomneyBensimhon ? ?HPI: ?Tyler Ochoa is a 71 y.o. male with history of CAD, chronic systolic CHF/CM, alcohol use with cirrhosis and ascites, CKD II-III, HTN, HLD, recently discovered renal mass and right lung nodule (CT chest 10/22).  ? ?Cardiac history as follows: ? ?Echo at PCP's office 01/27/2021: EF 25-30%, severe concentric LVH, severely reduced RV, severe BAE, mild MR. Findings on echo concerning for infiltrative CM. cMRI recommended. ? ?Later admitted 02/02/21 with acute systolic CHF c/b hepatorenal syndrome. Diuresed with low-dose dopamine and IV lasix.  ? ?[facility]22 with a/c CHF. BNP 4339. Echo with EF < 20%, moderately reduced RV, mild MR. Diuresed with lasix gtt. Hs troponin up to 420. R/LHC 09/08/21 with CTO mid LAD, moderate disease Lcx and RCA, RA 6, PA 55/20 (32), PCWP 16, Fick CO 5.2/CI 2.3, PA sat 69%. Plan to consider viability study at later date once stable. Initiation of GDMT limited by hypotension.  ? ?cMRI 11/03/21: LVEF 12%, severely reduced RV with RVEF 18%, extensive LGE throughout LV myocardium and patchy LGE in RV, elevated T1 and ECV. 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 Moderate right and small left pleural effusions. ? ?He was markedly volume overloaded 1/23. Lasix stopped, torsemide 40 started.  ? 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  ? ?Follow up 3/23, volume up and torsemide increased to 60 mg daily. Weight 229 lbs.  ? ?Today he returns for HF follow up with his sister and girlfriend. Overall feeling fine. Had a low BP at home and felt lightheaded. Stopped Lasix and KCL for a couple days and felt better, had short episode of palpitations. Has some swelling in legs but overall improved. No SOB with ADLs or walking around the house w/ walker. Continues with severe  knee OA, wears knee braces. Denies CP, or abnormal bleeding. Sleeps reclined in hospital bed. Appetite ok. No fever or chills. Weight at home 219 pounds. Taking all medications. No further ETOH use (previously drank 18 beers/day for many years; quit 3/22). ? ?FH: Mother had HF in her 3580s. Brother passed from sudden death at age of 69. ? ?Past Medical History:  ?Diagnosis Date  ? CHF (congestive heart failure) (HCC)   ? Chronic kidney disease   ? Hyperlipidemia   ? Hypertension   ? Peripheral edema   ? ?Current Outpatient Medications  ?Medication Sig Dispense Refill  ? acetaminophen (TYLENOL) 500 MG tablet Take 500-1,000 mg by mouth every 6 (six) hours as needed for mild pain or moderate pain.    ? aspirin EC 81 MG tablet Take 1 tablet (81 mg total) by mouth daily. Swallow whole. 30 tablet 5  ? carvedilol (COREG) 3.125 MG tablet Take 1 tablet (3.125 mg total) by mouth 2 (two) times daily with a meal. 60 tablet 5  ? dapagliflozin propanediol (FARXIGA) 10 MG TABS tablet Take 1 tablet (10 mg total) by mouth daily before breakfast. 30 tablet 3  ? potassium chloride SA (KLOR-CON M) 20 MEQ tablet Take 2 tablets (40 mEq total) by mouth daily. 30 tablet 5  ? PROAIR HFA 108 (90 Base) MCG/ACT inhaler Inhale 1-2 puffs into the lungs every 6 (six) hours as needed for wheezing.    ? sacubitril-valsartan (ENTRESTO) 24-26 MG Take 1 tablet by mouth 2 (two) times daily.    ? spironolactone (ALDACTONE) 25 MG tablet Take  0.5 tablets (12.5 mg total) by mouth daily. 15 tablet 3  ? Tafamidis (VYNDAMAX) 61 MG CAPS Take 1 capsule by mouth daily. 30 capsule 11  ? torsemide (DEMADEX) 20 MG tablet Take 2 tablets (40 mg total) by mouth daily. 180 tablet 3  ? ?No current facility-administered medications for this encounter.  ? ?No Known Allergies ? ?Social History  ? ?Socioeconomic History  ? Marital status: Single  ?  Spouse name: Not on file  ? Number of children: Not on file  ? Years of education: Not on file  ? Highest education level: Not  on file  ?Occupational History  ? Not on file  ?Tobacco Use  ? Smoking status: Never  ? Smokeless tobacco: Never  ?Vaping Use  ? Vaping Use: Never used  ?Substance and Sexual Activity  ? Alcohol use: Not on file  ? Drug use: Not on file  ? Sexual activity: Not on file  ?Other Topics Concern  ? Not on file  ?Social History Narrative  ? ** Merged History Encounter **  ?    ? ?Social Determinants of Health  ? ?Financial Resource Strain: Not on file  ?Food Insecurity: Not on file  ?Transportation Needs: Not on file  ?Physical Activity: Not on file  ?Stress: Not on file  ?Social Connections: Not on file  ?Intimate Partner Violence: Not on file  ? ? No family history on file. ? ?BP 108/78   Pulse 72   Wt 102.2 kg (225 lb 3.2 oz)   SpO2 100%   BMI 28.91 kg/m?  ? ?Wt Readings from Last 3 Encounters:  ?03/01/22 102.2 kg (225 lb 3.2 oz)  ?02/01/22 106.1 kg (233 lb 12.8 oz)  ?01/04/22 96.7 kg (213 lb 3.2 oz)  ? ?PHYSICAL EXAM: ?General:  NAD. No resp difficulty ?HEENT: Normal ?Neck: Supple. No JVD. Carotids 2+ bilat; no bruits. No lymphadenopathy or thryomegaly appreciated. ?Cor: PMI nondisplaced. Regular rate & rhythm. No rubs, gallops or murmurs. ?Lungs: Clear ?Abdomen: Soft, nontender, nondistended. No hepatosplenomegaly. No bruits or masses. Good bowel sounds. ?Extremities: No cyanosis, clubbing, rash, trace BLE edema, compression hose on. ?Neuro: Alert & oriented x 3, cranial nerves grossly intact. Moves all 4 extremities w/o difficulty. Affect pleasant. ? ?ECG (personally reviewed): NSR with 1st AVB PR 282 msec, 76 bpm ? ?ReDs: 34% ? 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 Moderate right and small left pleural effusions. ?- PYP (2/23) markedly positive. ?- NYHA II-III, limited by severe bilateral knee OA. Volume better today. Weight down 8 lbs. ReDs 34%. ?- Continue torsemide 40 mg daily + extra 20 KCL. ?- Continue spiro 12.5 mg daily. Consider increasing next visit and stopping/reducing KCL supp. ?- Continue Entresto 24/26 mg bid. No BP room to increase. ?- Continue Farxiga 10 mg daily. ?- Continue carvedilol 3.125 mg bid. ?- SPEP - polyclonal increase. No M-spike. Urine IFE negative. ?- PYP markedly positive. ?- Genetic testing +.  Will need 1st degree relatives screened. See below. ?- Repeat echo ~ 6 months. ?- BMET/BNP today. ? ?2. Cardiac amyloidosis ?- LGE pattern on cMRI appears consistent with cardiac amyloidosis. ?- Myeloma panel negative. ?- Genetic testing +  Val142lle. ?- PYP 2/23 markedly positive. ?- On Vyndamax. ?- Referred to Dr. Jomarie Longs for genetic counseling, has visit in June. ? ?3. CAD ?- CTO LAD with collaterals, moderate disease Lcx and RCA ?- Managed medically. ?- No s/s angina. ?- On aspirin. ?- Off statin with elevated LFTs. ? ?4. CKD II ?- Last Scr 0.88 -> 1.47-> 1.87 ?- Labs today. ? ?5. HTN ?- Stable. ?- Meds as above. ? ?6. HLD ?- Off statin per primary cardiologist given elevated LFTs. ? ?7. Liver cirrhosis ?- Noted on US abdomen. ?- He is now abstinent. ? ?Follow up in 5-6 months with Dr. Gala Romney + echo. ? ?Jacklynn Ganong, FNP ?03/01/22  ? ?

## 2022-02-26 ENCOUNTER — Ambulatory Visit: Payer: Medicare HMO | Admitting: Cardiology

## 2022-03-01 ENCOUNTER — Ambulatory Visit (HOSPITAL_COMMUNITY)
Admission: RE | Admit: 2022-03-01 | Discharge: 2022-03-01 | Disposition: A | Discharge status: Home / Self Care | Payer: Medicare HMO | Source: Ambulatory Visit | Attending: Family Medicine | Admitting: Family Medicine

## 2022-03-01 ENCOUNTER — Encounter (HOSPITAL_COMMUNITY): Payer: Self-pay

## 2022-03-01 VITALS — BP 108/78 | HR 72 | Wt 225.2 lb

## 2022-03-01 DIAGNOSIS — Z7982 Long term (current) use of aspirin: Secondary | ICD-10-CM | POA: Insufficient documentation

## 2022-03-01 DIAGNOSIS — I43 Cardiomyopathy in diseases classified elsewhere: Secondary | ICD-10-CM

## 2022-03-01 DIAGNOSIS — Z79899 Other long term (current) drug therapy: Secondary | ICD-10-CM | POA: Insufficient documentation

## 2022-03-01 DIAGNOSIS — K7460 Unspecified cirrhosis of liver: Secondary | ICD-10-CM | POA: Insufficient documentation

## 2022-03-01 DIAGNOSIS — I2510 Atherosclerotic heart disease of native coronary artery without angina pectoris: Secondary | ICD-10-CM | POA: Insufficient documentation

## 2022-03-01 DIAGNOSIS — I10 Essential (primary) hypertension: Secondary | ICD-10-CM

## 2022-03-01 DIAGNOSIS — N182 Chronic kidney disease, stage 2 (mild): Secondary | ICD-10-CM | POA: Insufficient documentation

## 2022-03-01 DIAGNOSIS — I340 Nonrheumatic mitral (valve) insufficiency: Secondary | ICD-10-CM | POA: Diagnosis not present

## 2022-03-01 DIAGNOSIS — N2889 Other specified disorders of kidney and ureter: Secondary | ICD-10-CM | POA: Insufficient documentation

## 2022-03-01 DIAGNOSIS — E854 Organ-limited amyloidosis: Secondary | ICD-10-CM | POA: Diagnosis not present

## 2022-03-01 DIAGNOSIS — I5022 Chronic systolic (congestive) heart failure: Secondary | ICD-10-CM | POA: Diagnosis not present

## 2022-03-01 DIAGNOSIS — R188 Other ascites: Secondary | ICD-10-CM

## 2022-03-01 DIAGNOSIS — R911 Solitary pulmonary nodule: Secondary | ICD-10-CM | POA: Insufficient documentation

## 2022-03-01 DIAGNOSIS — I5020 Unspecified systolic (congestive) heart failure: Secondary | ICD-10-CM

## 2022-03-01 DIAGNOSIS — E785 Hyperlipidemia, unspecified: Secondary | ICD-10-CM

## 2022-03-01 DIAGNOSIS — I130 Hypertensive heart and chronic kidney disease with heart failure and stage 1 through stage 4 chronic kidney disease, or unspecified chronic kidney disease: Secondary | ICD-10-CM | POA: Diagnosis present

## 2022-03-01 LAB — BRAIN NATRIURETIC PEPTIDE: B Natriuretic Peptide: 1752.8 pg/mL — ABNORMAL HIGH (ref 0.0–100.0)

## 2022-03-01 LAB — BASIC METABOLIC PANEL
Anion gap: 11 (ref 5–15)
BUN: 39 mg/dL — ABNORMAL HIGH (ref 8–23)
CO2: 23 mmol/L (ref 22–32)
Calcium: 9.1 mg/dL (ref 8.9–10.3)
Chloride: 104 mmol/L (ref 98–111)
Creatinine, Ser: 1.96 mg/dL — ABNORMAL HIGH (ref 0.61–1.24)
GFR, Estimated: 37 mL/min — ABNORMAL LOW (ref >60)
Glucose, Bld: 97 mg/dL (ref 70–99)
Potassium: 4.3 mmol/L (ref 3.5–5.1)
Sodium: 138 mmol/L (ref 135–145)

## 2022-03-01 NOTE — Progress Notes (Signed)
ReDS Vest / Clip - 03/01/22 1400   ? ?  ? ReDS Vest / Clip  ? Station Marker D   ? Ruler Value 31.5   ? ReDS Value Range Low volume   ? ReDS Actual Value 34   ? ?  ?  ? ?  ? ? ?

## 2022-03-01 NOTE — Patient Instructions (Signed)
It was great to see you today! ?No medication changes are needed at this time. ? ? ?Labs today ?We will only contact you if something comes back abnormal or we need to make some changes. ?Otherwise no news is good news! ? ? ?Your physician recommends that you schedule a follow-up appointment in: 5-6 months with Dr Haroldine Laws and echo ? ? ?Your physician has requested that you have an echocardiogram. Echocardiography is a painless test that uses sound waves to create images of your heart. It provides your doctor with information about the size and shape of your heart and how well your heart?s chambers and valves are working. This procedure takes approximately one hour. There are no restrictions for this procedure. ? ? ?Do the following things EVERYDAY: ?Weigh yourself in the morning before breakfast. Write it down and keep it in a log. ?Take your medicines as prescribed ?Eat low salt foods--Limit salt (sodium) to 2000 mg per day.  ?Stay as active as you can everyday ?Limit all fluids for the day to less than 2 liters ? ?At the [facility], you and your health needs are our priority. As part of our continuing mission to provide you with exceptional heart care, we have created designated Provider Care Teams. These Care Teams include your primary Cardiologist (physician) and Advanced Practice Providers (APPs- Physician Assistants and Nurse Practitioners) who all work together to provide you with the care you need, when you need it.  ? ?You may see any of the following providers on your designated Care Team at your next follow up: ?Dr Glori Bickers ?Dr Loralie Champagne ?Darrick Grinder, NP ?Lyda Jester, PA ?Jessica Milford,NP ?Marlyce Huge, PA ?Audry Riles, PharmD ? ? ?Please be sure to bring in all your medications bottles to every appointment.  ? ? ?

## 2022-03-03 ENCOUNTER — Other Ambulatory Visit (HOSPITAL_COMMUNITY): Payer: Self-pay

## 2022-03-05 ENCOUNTER — Telehealth: Payer: Self-pay | Admitting: Family

## 2022-03-05 NOTE — Telephone Encounter (Signed)
Patient called to get an update on his status of Entresto application but we never did one for patient assitance for that one only farxiga which is still pending. Advised patient to come in Monday 5/1 and bring proof of income and to sign application that we can send off for potiental approval and give him samples temporarily to tie him over if needed. ? ? ?Tyler Ochoa, NT ?

## 2022-03-08 ENCOUNTER — Other Ambulatory Visit: Payer: Self-pay | Admitting: Family

## 2022-03-08 ENCOUNTER — Telehealth: Payer: Self-pay | Admitting: Family

## 2022-03-08 MED ORDER — ENTRESTO 24-26 MG PO TABS: 1.0000 | ORAL_TABLET | Freq: Two times a day (BID) | ORAL | 3 refills | Status: DC

## 2022-03-08 NOTE — Telephone Encounter (Signed)
Notified patient that he has been approved for [employer] patient assistance for Entresto until the end of the year.  ? ? ?Maeli Spacek, NT ?

## 2022-03-26 ENCOUNTER — Other Ambulatory Visit (HOSPITAL_COMMUNITY): Payer: Self-pay

## 2022-03-29 ENCOUNTER — Other Ambulatory Visit (HOSPITAL_COMMUNITY): Payer: Self-pay

## 2022-03-30 ENCOUNTER — Other Ambulatory Visit (HOSPITAL_COMMUNITY): Payer: Self-pay

## 2022-03-30 ENCOUNTER — Other Ambulatory Visit (HOSPITAL_COMMUNITY): Payer: Self-pay | Admitting: Internal Medicine

## 2022-03-31 ENCOUNTER — Other Ambulatory Visit (HOSPITAL_COMMUNITY): Payer: Self-pay

## 2022-03-31 ENCOUNTER — Telehealth (HOSPITAL_COMMUNITY): Payer: Self-pay | Admitting: Pharmacy Technician

## 2022-03-31 NOTE — Telephone Encounter (Signed)
Advanced Heart Failure Patient Advocate Encounter  Specialty pharmacy contacted office regarding the patient's PAN grant. Apparently the grant was rejecting stating that the patient's DOB was incorrect. From what I could see on my end, the DOB matched. Called PAN. The representative was able to go in and fix the DOB that they had on file.  Claim reprocessed and went through successfully. Will be shipped out to the patient.   Archer Asa, CPhT

## 2022-04-01 ENCOUNTER — Other Ambulatory Visit (HOSPITAL_COMMUNITY): Payer: Self-pay

## 2022-04-05 ENCOUNTER — Other Ambulatory Visit (HOSPITAL_COMMUNITY): Payer: Self-pay

## 2022-04-13 ENCOUNTER — Encounter: Payer: Medicare HMO | Admitting: Genetic Counselor

## 2022-04-20 ENCOUNTER — Other Ambulatory Visit (HOSPITAL_COMMUNITY): Payer: Self-pay | Admitting: Internal Medicine

## 2022-04-23 ENCOUNTER — Other Ambulatory Visit (HOSPITAL_COMMUNITY): Payer: Self-pay

## 2022-04-29 ENCOUNTER — Other Ambulatory Visit (HOSPITAL_COMMUNITY): Payer: Self-pay

## 2022-05-13 ENCOUNTER — Other Ambulatory Visit (HOSPITAL_COMMUNITY): Payer: Self-pay | Admitting: Family Medicine

## 2022-05-19 ENCOUNTER — Other Ambulatory Visit (HOSPITAL_COMMUNITY): Payer: Self-pay

## 2022-05-25 ENCOUNTER — Other Ambulatory Visit (HOSPITAL_COMMUNITY): Payer: Self-pay

## 2022-05-27 ENCOUNTER — Other Ambulatory Visit (HOSPITAL_COMMUNITY): Payer: Self-pay

## 2022-06-21 ENCOUNTER — Other Ambulatory Visit (HOSPITAL_COMMUNITY): Payer: Self-pay

## 2022-06-22 ENCOUNTER — Other Ambulatory Visit (HOSPITAL_COMMUNITY): Payer: Self-pay

## 2022-06-22 DIAGNOSIS — I5020 Unspecified systolic (congestive) heart failure: Secondary | ICD-10-CM

## 2022-06-22 NOTE — Progress Notes (Signed)
Orders Placed This Encounter  Procedures   ECHOCARDIOGRAM COMPLETE    Standing Status:   Future    Standing Expiration Date:   06/23/2023    Order Specific Question:   Where should this test be performed    Answer:   [facility]    Order Specific Question:   Perflutren DEFINITY (image enhancing agent) should be administered unless hypersensitivity or allergy exist    Answer:   Administer Perflutren    Order Specific Question:   Reason for exam-Echo    Answer:   Congestive Heart Failure  I50.9    Order Specific Question:   Release to patient    Answer:   Immediate    

## 2022-06-25 ENCOUNTER — Other Ambulatory Visit (HOSPITAL_COMMUNITY): Payer: Self-pay | Admitting: Family Medicine

## 2022-06-30 ENCOUNTER — Other Ambulatory Visit (HOSPITAL_COMMUNITY): Payer: Self-pay

## 2022-07-07 ENCOUNTER — Other Ambulatory Visit: Payer: Self-pay | Admitting: Family

## 2022-07-07 MED ORDER — DAPAGLIFLOZIN PROPANEDIOL 10 MG PO TABS: 10.0000 mg | ORAL_TABLET | Freq: Every day | ORAL | 3 refills | Status: DC

## 2022-07-22 ENCOUNTER — Other Ambulatory Visit (HOSPITAL_COMMUNITY): Payer: Self-pay

## 2022-07-29 ENCOUNTER — Other Ambulatory Visit (HOSPITAL_COMMUNITY): Payer: Self-pay

## 2022-08-06 ENCOUNTER — Ambulatory Visit (HOSPITAL_BASED_OUTPATIENT_CLINIC_OR_DEPARTMENT_OTHER)
Admission: RE | Admit: 2022-08-06 | Discharge: 2022-08-06 | Disposition: A | Discharge status: Home / Self Care | Payer: Medicare HMO | Source: Ambulatory Visit | Attending: Internal Medicine | Admitting: Internal Medicine

## 2022-08-06 ENCOUNTER — Encounter (HOSPITAL_COMMUNITY): Payer: Self-pay | Admitting: Internal Medicine

## 2022-08-06 ENCOUNTER — Ambulatory Visit (HOSPITAL_COMMUNITY)
Admission: RE | Admit: 2022-08-06 | Discharge: 2022-08-06 | Disposition: A | Discharge status: Home / Self Care | Payer: Medicare HMO | Source: Ambulatory Visit | Attending: Internal Medicine | Admitting: Internal Medicine

## 2022-08-06 VITALS — BP 108/66 | HR 79 | Wt 225.2 lb

## 2022-08-06 DIAGNOSIS — E854 Organ-limited amyloidosis: Secondary | ICD-10-CM | POA: Diagnosis not present

## 2022-08-06 DIAGNOSIS — I43 Cardiomyopathy in diseases classified elsewhere: Secondary | ICD-10-CM

## 2022-08-06 DIAGNOSIS — I5022 Chronic systolic (congestive) heart failure: Secondary | ICD-10-CM | POA: Insufficient documentation

## 2022-08-06 DIAGNOSIS — I2510 Atherosclerotic heart disease of native coronary artery without angina pectoris: Secondary | ICD-10-CM | POA: Insufficient documentation

## 2022-08-06 DIAGNOSIS — K7460 Unspecified cirrhosis of liver: Secondary | ICD-10-CM

## 2022-08-06 DIAGNOSIS — E785 Hyperlipidemia, unspecified: Secondary | ICD-10-CM | POA: Insufficient documentation

## 2022-08-06 DIAGNOSIS — N182 Chronic kidney disease, stage 2 (mild): Secondary | ICD-10-CM | POA: Diagnosis not present

## 2022-08-06 DIAGNOSIS — Z79899 Other long term (current) drug therapy: Secondary | ICD-10-CM | POA: Diagnosis not present

## 2022-08-06 DIAGNOSIS — Z8249 Family history of ischemic heart disease and other diseases of the circulatory system: Secondary | ICD-10-CM | POA: Diagnosis not present

## 2022-08-06 DIAGNOSIS — I130 Hypertensive heart and chronic kidney disease with heart failure and stage 1 through stage 4 chronic kidney disease, or unspecified chronic kidney disease: Secondary | ICD-10-CM | POA: Diagnosis not present

## 2022-08-06 DIAGNOSIS — R188 Other ascites: Secondary | ICD-10-CM

## 2022-08-06 DIAGNOSIS — I5020 Unspecified systolic (congestive) heart failure: Secondary | ICD-10-CM

## 2022-08-06 LAB — CBC
HCT: 52.8 % — ABNORMAL HIGH (ref 39.0–52.0)
Hemoglobin: 17.9 g/dL — ABNORMAL HIGH (ref 13.0–17.0)
MCH: 30.9 pg (ref 26.0–34.0)
MCHC: 33.9 g/dL (ref 30.0–36.0)
MCV: 91.2 fL (ref 80.0–100.0)
Platelets: 255 10*3/uL (ref 150–400)
RBC: 5.79 MIL/uL (ref 4.22–5.81)
RDW: 13.3 % (ref 11.5–15.5)
WBC: 4.8 10*3/uL (ref 4.0–10.5)
nRBC: 0 % (ref 0.0–0.2)

## 2022-08-06 LAB — BASIC METABOLIC PANEL
Anion gap: 9 (ref 5–15)
BUN: 50 mg/dL — ABNORMAL HIGH (ref 8–23)
CO2: 23 mmol/L (ref 22–32)
Calcium: 9.3 mg/dL (ref 8.9–10.3)
Chloride: 104 mmol/L (ref 98–111)
Creatinine, Ser: 2.03 mg/dL — ABNORMAL HIGH (ref 0.61–1.24)
GFR, Estimated: 35 mL/min — ABNORMAL LOW (ref >60)
Glucose, Bld: 102 mg/dL — ABNORMAL HIGH (ref 70–99)
Potassium: 4.5 mmol/L (ref 3.5–5.1)
Sodium: 136 mmol/L (ref 135–145)

## 2022-08-06 LAB — ECHOCARDIOGRAM COMPLETE
Area-P 1/2: 3.08 cm2
Calc EF: 42 %
S' Lateral: 3.4 cm
Single Plane A2C EF: 40.9 %
Single Plane A4C EF: 44.6 %

## 2022-08-06 LAB — BRAIN NATRIURETIC PEPTIDE: B Natriuretic Peptide: 320.6 pg/mL — ABNORMAL HIGH (ref 0.0–100.0)

## 2022-08-06 NOTE — Patient Instructions (Addendum)
Once approved start Amvuttra as directed   Labs done today, your results will be available in MyChart, we will contact you for abnormal readings.  Your physician recommends that you schedule a follow-up appointment in: 4 months ( January 2024)  ** please call the office in November to arrange your follow up appointment **  If you have any questions or concerns before your next appointment please send [place] a message through [place] or call our office at [phone].    TO LEAVE A MESSAGE FOR THE NURSE SELECT OPTION 2, PLEASE LEAVE A MESSAGE INCLUDING: YOUR NAME DATE OF BIRTH CALL BACK NUMBER REASON FOR CALL**this is important as we prioritize the call backs  YOU WILL RECEIVE A CALL BACK THE SAME DAY AS LONG AS YOU CALL BEFORE 4:00 PM  At the [facility], you and your health needs are our priority. As part of our continuing mission to provide you with exceptional heart care, we have created designated Provider Care Teams. These Care Teams include your primary Cardiologist (physician) and Advanced Practice Providers (APPs- Physician Assistants and Nurse Practitioners) who all work together to provide you with the care you need, when you need it.   You may see any of the following providers on your designated Care Team at your next follow up: Dr Glori Bickers Dr Loralie Champagne Dr. Roxana Hires, NP Lyda Jester, [place] [facility] [place], [place] Forestine Na, NP Audry Riles, PharmD   Please be sure to bring in all your medications bottles to every appointment.

## 2022-08-06 NOTE — Progress Notes (Incomplete)
Advanced Heart Failure Clinic Note    PCP: [facility] [place] PCP-Cardiologist: Kate Sable, MD  HF Cardiologist: Dr. Haroldine Laws  HPI: Tyler Ochoa. Tyler Ochoa is a 71 y.o. male with history of CAD, chronic systolic CHF/CM, alcohol use with cirrhosis and ascites, CKD II-III, HTN, HLD, recently discovered renal mass and right lung nodule (CT chest 10/22).   Cardiac history as follows: Echo at PCP's office 01/27/2021: EF 25-30%, severe concentric LVH, severely reduced RV, severe BAE, mild MR. Findings on echo concerning for infiltrative CM. cMRI recommended.  Later admitted 02/02/21 with acute systolic CHF c/b hepatorenal syndrome. Diuresed with low-dose dopamine and IV lasix.   Admitted to [facility] 10/22 with a/c CHF. BNP 4339. Echo with EF < 20%, moderately reduced RV, mild MR. Diuresed with lasix gtt. Hs troponin up to 420. R/LHC 09/08/21 with CTO mid LAD, moderate disease Lcx and RCA, RA 6, PA 55/20 (32), PCWP 16, Fick CO 5.2/CI 2.3, PA sat 69%. Plan to consider viability study at later date once stable. Initiation of GDMT limited by hypotension.   cMRI 11/03/21: LVEF 12%, severely reduced RV with RVEF 18%, extensive LGE throughout LV myocardium and patchy LGE in RV, elevated T1 and ECV. 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 Moderate right and small left pleural effusions.  He was markedly volume overloaded 1/23. Lasix stopped, torsemide 40 started.   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  Today he returns for HF f/u with his fiancee. Overall he's feeling good just frequently cold. Fluid status looks stable on exam. Occasionally gets lightheaded when getting up to fast from bed. Denies dizziness. He uses a walker/crutches due to knee OA. Denies SOB with ADLs or walking short distances. Eating well. Weight at home 229 lbs. BP at home stays in low 100s/60s. Taking all medications.  Denies CP.   FH: Mother had  HF in her 55s. Brother passed from sudden death at age of 8.  Past Medical History:  Diagnosis Date   CHF (congestive heart failure) (HCC)    Chronic kidney disease    Hyperlipidemia    Hypertension    Peripheral edema    Current Outpatient Medications  Medication Sig Dispense Refill   acetaminophen (TYLENOL) 500 MG tablet Take 500-1,000 mg by mouth every 6 (six) hours as needed for mild pain or moderate pain.     aspirin EC 81 MG tablet Take 1 tablet (81 mg total) by mouth daily. Swallow whole. 30 tablet 5   carvedilol (COREG) 3.125 MG tablet TAKE 1 TABLET BY MOUTH TWICE DAILY WITH A MEAL 180 tablet 3   dapagliflozin propanediol (FARXIGA) 10 MG TABS tablet Take 1 tablet (10 mg total) by mouth daily before breakfast. 90 tablet 3   potassium chloride SA (KLOR-CON M) 20 MEQ tablet Take 2 tablets by mouth once daily 180 tablet 0   PROAIR HFA 108 (90 Base) MCG/ACT inhaler Inhale 1-2 puffs into the lungs every 6 (six) hours as needed for wheezing.     sacubitril-valsartan (ENTRESTO) 24-26 MG Take 1 tablet by mouth 2 (two) times daily. 180 tablet 3   spironolactone (ALDACTONE) 25 MG tablet Take 1/2 (one-half) tablet by mouth once daily 45 tablet 3   Tafamidis (VYNDAMAX) 61 MG CAPS Take 1 capsule by mouth daily. 30 capsule 11   torsemide (DEMADEX) 20 MG tablet Take 2 tablets (40 mg total) by mouth daily. 180 tablet 3   No current facility-administered medications for this encounter.   No  Known Allergies  Social History   Socioeconomic History   Marital status: Single    Spouse name: Not on file   Number of children: Not on file   Years of education: Not on file   Highest education level: Not on file  Occupational History   Not on file  Tobacco Use   Smoking status: Never   Smokeless tobacco: Never  Vaping Use   Vaping Use: Never used  Substance and Sexual Activity   Alcohol use: Not on file   Drug use: Not on file   Sexual activity: Not on file  Other Topics Concern   Not on  file  Social History Narrative   ** Merged History Encounter **       Social Determinants of Health   Financial Resource Strain: Not on file  Food Insecurity: Not on file  Transportation Needs: Not on file  Physical Activity: Not on file  Stress: Not on file  Social Connections: Not on file  Intimate Partner Violence: Not on file    No family history on file.  BP 108/66   Pulse 79   Wt 102.2 kg (225 lb 3.2 oz)   SpO2 98%   BMI 28.91 kg/m   Wt Readings from Last 3 Encounters:  08/06/22 102.2 kg (225 lb 3.2 oz)  03/01/22 102.2 kg (225 lb 3.2 oz)  02/01/22 106.1 kg (233 lb 12.8 oz)   PHYSICAL EXAM: General:  In wheelchair, well appearing.  No respiratory difficulty HEENT: normal Neck: supple. No JVD. Carotids 2+ bilat; no bruits. No lymphadenopathy or thyromegaly appreciated. Cor: PMI nondisplaced. Regular rate & rhythm. No rubs, gallops or murmurs. Lungs: clear Abdomen: soft, nontender, nondistended. No hepatosplenomegaly. No bruits or masses. Good bowel sounds. Extremities: no cyanosis, clubbing, rash, edema. Brace to bilateral knees.  Neuro: alert & oriented x 3, cranial nerves grossly intact. moves all 4 extremities w/o difficulty. Affect pleasant.   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 Moderate right and small left pleural effusions. - PYP (2/23) markedly positive. - Echo today same as previous -  NYHA II-III, volume looks good today - Continue torsemide 40 daily. - Continue spiro 12.5 mg daily. - Continue Entresto 24/26 mg bid, unable to uptitrate w/ BP - Continue Farxiga 10 mg daily. - Continue carvedilol 3.125 mg bid. - SPEP - polyclonal increase. No M-spike. Urine IFE negative. - PYP markedly positive. - Genetic testing +. Will need 1st degree relatives screened.  - Check labs today  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 - Myeloma panel negative. - Genetic testing +  Val142lle. Will try to start [place]  - PYP 2/23 markedly positive. - PharmD assisting with grant for [employer]. - Refer to Dr. Broadus John for genetic counseling  3. CAD - CTO LAD with collaterals, moderate disease Lcx and RCA - Managed medically. - No s/s angina - On aspirin. - Off statin  with elevated LFTs.  4. CKD II - Last Scr 0.88 -> last 1.96 4/23, will check labs today  5. HTN - Continue GDMT as able.  6. HLD - Off statin per primary cardiologist given elevated LFTs.  7. Liver cirrhosis - Noted on US abdomen. - No more ETOH.  Earnie Larsson, AGACNP-BC  08/06/22

## 2022-08-09 ENCOUNTER — Other Ambulatory Visit (HOSPITAL_COMMUNITY): Payer: Self-pay | Admitting: Cardiology

## 2022-08-17 ENCOUNTER — Other Ambulatory Visit (HOSPITAL_COMMUNITY): Payer: Self-pay

## 2022-08-18 ENCOUNTER — Other Ambulatory Visit (HOSPITAL_COMMUNITY): Payer: Self-pay

## 2022-08-26 ENCOUNTER — Other Ambulatory Visit (HOSPITAL_COMMUNITY): Payer: Self-pay

## 2022-09-13 ENCOUNTER — Telehealth (HOSPITAL_COMMUNITY): Payer: Self-pay | Admitting: Licensed Clinical Social Worker

## 2022-09-13 NOTE — Telephone Encounter (Signed)
H&V Care Navigation CSW Progress Note  Clinical Social Worker received call from pt requesting help with reapplying to [employer] assistance.  Pt request application be mailed to him for completion- CSW sending out application along with return address.  SDOH Screenings   Depression (PHQ2-9): Low Risk  (11/05/2021)  Tobacco Use: Low Risk  (08/06/2022)    Jorge Ny, LCSW Clinical Social Worker Advanced Heart Failure Clinic Desk#: [phone] Cell#: [phone]

## 2022-09-20 ENCOUNTER — Other Ambulatory Visit (HOSPITAL_COMMUNITY): Payer: Self-pay

## 2022-09-27 ENCOUNTER — Other Ambulatory Visit (HOSPITAL_COMMUNITY): Payer: Self-pay

## 2022-10-13 ENCOUNTER — Telehealth (HOSPITAL_COMMUNITY): Payer: Self-pay | Admitting: Pharmacy Technician

## 2022-10-13 NOTE — Telephone Encounter (Signed)
Advanced Heart Failure Patient Advocate Encounter  Received patient's application for Farxiga assistance through [employer]. Sent in application via fax.  Will follow up.

## 2022-10-18 NOTE — Telephone Encounter (Signed)
Advanced Heart Failure Patient Advocate Encounter   Patient was approved to receive Farxiga from [employer]  Effective dates: 11/08/22 through 11/08/23  Document scanned to chart.   Archer Asa, CPhT

## 2022-10-20 ENCOUNTER — Other Ambulatory Visit: Payer: Self-pay

## 2022-10-22 ENCOUNTER — Other Ambulatory Visit: Payer: Self-pay

## 2022-10-26 ENCOUNTER — Other Ambulatory Visit: Payer: Self-pay

## 2022-10-27 ENCOUNTER — Other Ambulatory Visit: Payer: Self-pay

## 2022-11-02 ENCOUNTER — Telehealth (HOSPITAL_COMMUNITY): Payer: Self-pay | Admitting: Pharmacist

## 2022-11-02 NOTE — Telephone Encounter (Signed)
Patient Advocate Encounter   Received notification from [facility] that prior authorization for Vyndamax is required.   PA submitted on CoverMyMeds Key [id number] Status is pending   Will continue to follow.   Karle Plumber, PharmD, BCPS, BCCP, CPP Heart Failure Clinic Pharmacist [phone]

## 2022-11-02 NOTE — Telephone Encounter (Signed)
Advanced Heart Failure Patient Advocate Encounter  Prior Authorization for Tyler Ochoa has been approved.    Effective dates: 11/02/22 through 11/08/2023  Karle Plumber, PharmD, BCPS, BCCP, CPP Heart Failure Clinic Pharmacist [phone]

## 2022-11-03 ENCOUNTER — Emergency Department
Admission: EM | Admit: 2022-11-03 | Discharge: 2022-11-03 | Disposition: A | Discharge status: Home / Self Care | Payer: Medicare HMO | Attending: Emergency Medicine | Admitting: Emergency Medicine

## 2022-11-03 ENCOUNTER — Emergency Department: Payer: Medicare HMO

## 2022-11-03 ENCOUNTER — Other Ambulatory Visit: Payer: Self-pay

## 2022-11-03 DIAGNOSIS — I2510 Atherosclerotic heart disease of native coronary artery without angina pectoris: Secondary | ICD-10-CM | POA: Diagnosis not present

## 2022-11-03 DIAGNOSIS — I110 Hypertensive heart disease with heart failure: Secondary | ICD-10-CM | POA: Insufficient documentation

## 2022-11-03 DIAGNOSIS — I509 Heart failure, unspecified: Secondary | ICD-10-CM | POA: Insufficient documentation

## 2022-11-03 DIAGNOSIS — M25532 Pain in left wrist: Secondary | ICD-10-CM | POA: Diagnosis present

## 2022-11-03 LAB — CBC WITH DIFFERENTIAL/PLATELET
Abs Immature Granulocytes: 0.02 10*3/uL (ref 0.00–0.07)
Basophils Absolute: 0 10*3/uL (ref 0.0–0.1)
Basophils Relative: 1 %
Eosinophils Absolute: 0.1 10*3/uL (ref 0.0–0.5)
Eosinophils Relative: 2 %
HCT: 49.4 % (ref 39.0–52.0)
Hemoglobin: 16.5 g/dL (ref 13.0–17.0)
Immature Granulocytes: 0 %
Lymphocytes Relative: 21 %
Lymphs Abs: 1.1 10*3/uL (ref 0.7–4.0)
MCH: 30.4 pg (ref 26.0–34.0)
MCHC: 33.4 g/dL (ref 30.0–36.0)
MCV: 91.1 fL (ref 80.0–100.0)
Monocytes Absolute: 0.5 10*3/uL (ref 0.1–1.0)
Monocytes Relative: 9 %
Neutro Abs: 3.7 10*3/uL (ref 1.7–7.7)
Neutrophils Relative %: 67 %
Platelets: 197 10*3/uL (ref 150–400)
RBC: 5.42 MIL/uL (ref 4.22–5.81)
RDW: 12.4 % (ref 11.5–15.5)
WBC: 5.5 10*3/uL (ref 4.0–10.5)
nRBC: 0 % (ref 0.0–0.2)

## 2022-11-03 LAB — BASIC METABOLIC PANEL
Anion gap: 8 (ref 5–15)
BUN: 35 mg/dL — ABNORMAL HIGH (ref 8–23)
CO2: 24 mmol/L (ref 22–32)
Calcium: 9 mg/dL (ref 8.9–10.3)
Chloride: 103 mmol/L (ref 98–111)
Creatinine, Ser: 1.64 mg/dL — ABNORMAL HIGH (ref 0.61–1.24)
GFR, Estimated: 45 mL/min — ABNORMAL LOW (ref >60)
Glucose, Bld: 115 mg/dL — ABNORMAL HIGH (ref 70–99)
Potassium: 4.1 mmol/L (ref 3.5–5.1)
Sodium: 135 mmol/L (ref 135–145)

## 2022-11-03 LAB — URIC ACID: Uric Acid, Serum: 9.3 mg/dL — ABNORMAL HIGH (ref 3.7–8.6)

## 2022-11-03 MED ORDER — OXYCODONE-ACETAMINOPHEN 5-325 MG PO TABS: 1.0000 | ORAL_TABLET | ORAL | 0 refills | Status: AC | PRN

## 2022-11-03 MED ORDER — SULFAMETHOXAZOLE-TRIMETHOPRIM 800-160 MG PO TABS: 1.0000 | ORAL_TABLET | Freq: Two times a day (BID) | ORAL | 0 refills | Status: AC

## 2022-11-03 NOTE — Discharge Instructions (Addendum)
-  Please take all the antibiotics as prescribed.  -Call the orthopedist listed to schedule an appointment for follow up.   -Return to the emergency department at any time if you begin to experience any new or worsening symptoms.

## 2022-11-03 NOTE — ED Triage Notes (Signed)
Pt here with left wrist pain. Pt denies falling. Pt states is has been swollen for a few days. Pt also states wrist is painful.

## 2022-11-03 NOTE — ED Provider Triage Note (Signed)
Emergency Medicine Provider Triage Evaluation Note  Tyler Ochoa , a 71 y.o. male  was evaluated in triage.  Pt complains of left hand and wrist without known history of injury.  Review of Systems  Positive: Swelling+ Negative:   Physical Exam  Ht 6\' 2"  (1.88 m)   Wt 102.2 kg   BMI 28.93 kg/m  Gen:   Awake, no distress   Resp:  Normal effort  MSK:   Left hand dorsum with edema and into left wrist.  No warmth or redness.  Skin intact, radial pulse present.  Decreased ROM due to swelling.   Other:    Medical Decision Making  Medically screening exam initiated at 7:34 AM.  Appropriate orders placed.  Tyler Ochoa was informed that the remainder of the evaluation will be completed by another provider, this initial triage assessment does not replace that evaluation, and the importance of remaining in the ED until their evaluation is complete.     Paula Libra, PA-C 11/03/22 [phone]

## 2022-11-03 NOTE — ED Provider Notes (Signed)
[facility] Provider Note    Event Date/Time   First MD Initiated Contact with Patient 11/03/22 1120     (approximate)   History   Chief Complaint Wrist Pain   HPI Tyler Ochoa is a 71 y.o. male, history of hypertension, hyperlipidemia, NSTEMI, HFrEF, CAD, presents to the emergency department for evaluation of left wrist pain.  He states it has been swollen for approximately 2 to 3 weeks.  Denies any recent falls or injuries.  He is unsure what may have caused his pain and swelling.  Denies fever/chills, myalgias, chest pain, shortness breath, abdominal pain, flank pain, nausea/vomiting, dysuria, headache, rash/lesions, or dizziness/lightheadedness.  History Limitations: No limitations.        Physical Exam  Triage Vital Signs: ED Triage Vitals  Enc Vitals Group     BP 11/03/22 0734 115/82     Pulse Rate 11/03/22 0734 (!) 101     Resp 11/03/22 0734 18     Temp 11/03/22 0734 97.8 F (36.6 C)     Temp Source 11/03/22 0734 Oral     SpO2 11/03/22 0734 99 %     Weight 11/03/22 0733 225 lb 5 oz (102.2 kg)     Height 11/03/22 0733 6\' 2"  (1.88 m)     Head Circumference --      Peak Flow --      Pain Score 11/03/22 0733 10     Pain Loc --      Pain Edu? --      Excl. in GC? --     Most recent vital signs: Vitals:   11/03/22 0734  BP: 115/82  Pulse: (!) 101  Resp: 18  Temp: 97.8 F (36.6 C)  SpO2: 99%    General: Awake, NAD.  Skin: Warm, dry. No rashes or lesions.  Eyes: PERRL. Conjunctivae normal.  CV: Good peripheral perfusion.  Resp: Normal effort.  Abd: Soft, non-tender. No distention.  Neuro: At baseline. No gross neurological deficits.  Musculoskeletal: Normal ROM of all extremities.  Focused Exam: Diffuse soft tissue swelling across the left wrist and dorsum of the hand.  Normal range of motion.  No significant osseous tenderness.  PMS intact distally.  Physical Exam    ED Results / Procedures / Treatments  Labs (all  labs ordered are listed, but only abnormal results are displayed) Labs Reviewed  BASIC METABOLIC PANEL - Abnormal; Notable for the following components:      Result Value   Glucose, Bld 115 (*)    BUN 35 (*)    Creatinine, Ser 1.64 (*)    GFR, Estimated 45 (*)    All other components within normal limits  URIC ACID - Abnormal; Notable for the following components:   Uric Acid, Serum 9.3 (*)    All other components within normal limits  CBC WITH DIFFERENTIAL/PLATELET     EKG N/A.    RADIOLOGY  ED Provider Interpretation: I personally reviewed and interpreted this x-ray, soft tissue swelling noted.  DG Hand Complete Left  Result Date: 11/03/2022 CLINICAL DATA:  Swelling and pain. EXAM: LEFT HAND - COMPLETE 3+ VIEW COMPARISON:  None Available. FINDINGS: Areas of lucency in the tip of the ulnar styloid. Old ulnar styloid avulsion fracture. Diffuse soft tissue swelling. Mild first carpometacarpal joint osteoarthritis. IMPRESSION: 1. Areas of lucency in the tip of the ulnar styloid with overlying soft tissue swelling. Difficult to exclude osteomyelitis. 2. First carpometacarpal joint osteoarthritis. Electronically Signed   By: 11/05/2022  M.D.   On: 11/03/2022 08:11   DG Wrist Complete Left  Result Date: 11/03/2022 CLINICAL DATA:  Left wrist pain and swelling. EXAM: LEFT WRIST - COMPLETE 3+ VIEW COMPARISON:  None Available. FINDINGS: Old ulnar styloid avulsion fracture. Slight lucency in the tip of the ulnar styloid. Diffuse soft tissue swelling about the wrist. First carpometacarpal joint osteoarthritis. IMPRESSION: 1. Slight lucency in the tip of the ulnar styloid. Difficult to exclude osteomyelitis. 2. Diffuse soft tissue swelling. 3. First carpometacarpal joint osteoarthritis. Electronically Signed   By: Leanna Battles M.D.   On: 11/03/2022 08:10    PROCEDURES:  Critical Care performed: N/A.  Procedures    MEDICATIONS ORDERED IN ED: Medications - No data to  display   IMPRESSION / MDM / ASSESSMENT AND PLAN / ED COURSE  I reviewed the triage vital signs and the nursing notes.                              Differential diagnosis includes, but is not limited to, cellulitis, sprain, gout, osteomyelitis, abscess distal radius/ulna fracture.  ED Course Patient appears well, vitals within normal limits.  NAD.  CBC shows no leukocytosis or anemia.  BMP shows elevated creatinine 1.64, consistent with previous values.  No other electrolyte abnormalities.  Uric acid slightly elevated at 9.3.  Assessment/Plan Patient presents with left wrist swelling/pain x 3 weeks.  He does have a significant mount of soft tissue swelling on exam with mild tenderness.  No recent falls or injuries.  He does not have any signs of systemic infection.  However, his x-ray does show some lucency along the tip of the ulnar styloid.  They are unable to exclude osteomyelitis from the plain film radiograph.  I spoke with the patient about performing CT or MRI to further evaluate and exclude for osteomyelitis, however patient states that they are not interested in additional imaging at this time.  Given the risk and concern for infection, will at least provide him with Bactrim.  Provide him with a brief prescription for oxycodone for pain as well.  Strongly recommended that he follow-up with orthopedics within the next 24 to 48 hours for reevaluation.  He was amenable to this plan.  Will discharge.  Provided the patient with anticipatory guidance, return precautions, and educational material. Encouraged the patient to return to the emergency department at any time if they begin to experience any new or worsening symptoms. Patient expressed understanding and agreed with the plan.   Patient's presentation is most consistent with acute complicated illness / injury requiring diagnostic workup.       FINAL CLINICAL IMPRESSION(S) / ED DIAGNOSES   Final diagnoses:  Left wrist pain      Rx / DC Orders   ED Discharge Orders          Ordered    sulfamethoxazole-trimethoprim (BACTRIM DS) 800-160 MG tablet  2 times daily        11/03/22 1129    oxyCODONE-acetaminophen (PERCOCET) 5-325 MG tablet  Every 4 hours PRN        11/03/22 1133             Note:  This document was prepared using Dragon voice recognition software and may include unintentional dictation errors.   Varney Daily, [place] 11/03/22 Darnell Level    Minna Antis, MD 11/04/22 [phone]

## 2022-11-22 ENCOUNTER — Other Ambulatory Visit (HOSPITAL_COMMUNITY): Payer: Self-pay

## 2022-11-23 ENCOUNTER — Other Ambulatory Visit (HOSPITAL_COMMUNITY): Payer: Self-pay

## 2022-11-23 MED ORDER — DAPAGLIFLOZIN PROPANEDIOL 10 MG PO TABS: 10.0000 mg | ORAL_TABLET | Freq: Every day | ORAL | 3 refills | Status: DC

## 2022-11-25 ENCOUNTER — Other Ambulatory Visit: Payer: Self-pay

## 2022-11-26 ENCOUNTER — Telehealth (HOSPITAL_COMMUNITY): Payer: Self-pay | Admitting: Pharmacy Technician

## 2022-11-26 ENCOUNTER — Other Ambulatory Visit (HOSPITAL_COMMUNITY): Payer: Self-pay

## 2022-11-26 ENCOUNTER — Other Ambulatory Visit (HOSPITAL_COMMUNITY): Payer: Self-pay | Admitting: *Deleted

## 2022-11-26 MED ORDER — ENTRESTO 24-26 MG PO TABS
1.0000 | ORAL_TABLET | Freq: Two times a day (BID) | ORAL | 3 refills | Status: DC
  Filled 2022-11-26: qty 180, 90d supply, fill #0
  Filled 2023-03-25: qty 180, 90d supply, fill #1
  Filled 2023-06-20: qty 180, 90d supply, fill #2
  Filled 2023-09-13: qty 180, 90d supply, fill #3

## 2022-11-26 NOTE — Telephone Encounter (Signed)
Advanced Heart Failure Patient Advocate Encounter  Patient left message with SW stating he needs help with Entresto. Now that he is in catastrophic coverage due to filling the Vyndamax, his current 90 day RX will be $0. Called and left patient message asking if he wants to receive it in the mail with the Vyndamax. Advised patient to call back and will send script where he prefers.  Charlann Boxer, CPhT

## 2022-11-26 NOTE — Telephone Encounter (Signed)
Advanced Heart Failure Patient Advocate Encounter  Patient called back and agreed to get RX mailed from [facility]. Sent 90 day RX request to [facility] ([facility]) to [facility] to [facility].   Charlann Boxer, CPhT

## 2022-11-30 ENCOUNTER — Other Ambulatory Visit: Payer: Self-pay

## 2022-12-01 ENCOUNTER — Other Ambulatory Visit (HOSPITAL_COMMUNITY): Payer: Self-pay

## 2022-12-15 ENCOUNTER — Other Ambulatory Visit (HOSPITAL_COMMUNITY): Payer: Self-pay

## 2022-12-15 ENCOUNTER — Other Ambulatory Visit (HOSPITAL_COMMUNITY): Payer: Self-pay | Admitting: Internal Medicine

## 2022-12-15 MED ORDER — VYNDAMAX 61 MG PO CAPS
1.0000 | ORAL_CAPSULE | Freq: Every day | ORAL | 11 refills | Status: DC
Start: 2022-12-15 — End: 2023-12-19
  Filled 2022-12-15: qty 30, 30d supply, fill #0
  Filled 2023-01-12: qty 30, 30d supply, fill #1
  Filled 2023-02-09 – 2023-02-21 (×2): qty 30, 30d supply, fill #2
  Filled 2023-03-22: qty 30, 30d supply, fill #3
  Filled 2023-04-19: qty 30, 30d supply, fill #4
  Filled 2023-05-18: qty 30, 30d supply, fill #5
  Filled 2023-06-14: qty 30, 30d supply, fill #6
  Filled 2023-07-15: qty 30, 30d supply, fill #7
  Filled 2023-08-17: qty 30, 30d supply, fill #8
  Filled 2023-09-08: qty 30, 30d supply, fill #9
  Filled 2023-10-17: qty 30, 30d supply, fill #10
  Filled 2023-11-17: qty 30, 30d supply, fill #11

## 2022-12-16 ENCOUNTER — Telehealth: Payer: Self-pay | Admitting: Family

## 2022-12-16 NOTE — Telephone Encounter (Signed)
Patient approved for farxiga thru [employer] til end of 2024 and medications have been shipped    [place], [place]

## 2022-12-20 ENCOUNTER — Other Ambulatory Visit: Payer: Self-pay

## 2022-12-29 ENCOUNTER — Other Ambulatory Visit (HOSPITAL_COMMUNITY): Payer: Self-pay | Admitting: Internal Medicine

## 2023-01-12 ENCOUNTER — Other Ambulatory Visit (HOSPITAL_COMMUNITY): Payer: Self-pay

## 2023-01-19 ENCOUNTER — Other Ambulatory Visit (HOSPITAL_COMMUNITY): Payer: Self-pay

## 2023-02-09 ENCOUNTER — Other Ambulatory Visit (HOSPITAL_COMMUNITY): Payer: Self-pay

## 2023-02-21 ENCOUNTER — Other Ambulatory Visit (HOSPITAL_COMMUNITY): Payer: Self-pay

## 2023-02-22 ENCOUNTER — Other Ambulatory Visit: Payer: Self-pay

## 2023-03-14 ENCOUNTER — Other Ambulatory Visit (HOSPITAL_COMMUNITY): Payer: Self-pay | Admitting: Internal Medicine

## 2023-03-22 ENCOUNTER — Other Ambulatory Visit (HOSPITAL_COMMUNITY): Payer: Self-pay | Admitting: Family Medicine

## 2023-03-22 ENCOUNTER — Other Ambulatory Visit (HOSPITAL_COMMUNITY): Payer: Self-pay

## 2023-03-23 ENCOUNTER — Other Ambulatory Visit (HOSPITAL_COMMUNITY): Payer: Self-pay

## 2023-03-25 ENCOUNTER — Other Ambulatory Visit (HOSPITAL_COMMUNITY): Payer: Self-pay

## 2023-03-28 ENCOUNTER — Other Ambulatory Visit: Payer: Self-pay

## 2023-03-28 ENCOUNTER — Other Ambulatory Visit (HOSPITAL_COMMUNITY): Payer: Self-pay

## 2023-04-19 ENCOUNTER — Other Ambulatory Visit (HOSPITAL_COMMUNITY): Payer: Self-pay

## 2023-04-26 ENCOUNTER — Other Ambulatory Visit (HOSPITAL_COMMUNITY): Payer: Self-pay

## 2023-05-18 ENCOUNTER — Other Ambulatory Visit: Payer: Self-pay

## 2023-05-21 ENCOUNTER — Other Ambulatory Visit (HOSPITAL_COMMUNITY): Payer: Self-pay | Admitting: Internal Medicine

## 2023-05-23 ENCOUNTER — Other Ambulatory Visit (HOSPITAL_COMMUNITY): Payer: Self-pay

## 2023-06-10 ENCOUNTER — Other Ambulatory Visit: Payer: Self-pay

## 2023-06-10 ENCOUNTER — Other Ambulatory Visit (HOSPITAL_COMMUNITY): Payer: Self-pay | Admitting: Family Medicine

## 2023-06-14 ENCOUNTER — Other Ambulatory Visit (HOSPITAL_COMMUNITY): Payer: Self-pay

## 2023-06-15 ENCOUNTER — Other Ambulatory Visit (HOSPITAL_COMMUNITY): Payer: Self-pay | Admitting: Family Medicine

## 2023-06-18 ENCOUNTER — Other Ambulatory Visit (HOSPITAL_COMMUNITY): Payer: Self-pay | Admitting: Family Medicine

## 2023-06-20 ENCOUNTER — Other Ambulatory Visit: Payer: Self-pay

## 2023-06-21 ENCOUNTER — Other Ambulatory Visit: Payer: Self-pay

## 2023-07-15 ENCOUNTER — Other Ambulatory Visit (HOSPITAL_COMMUNITY): Payer: Self-pay

## 2023-07-21 ENCOUNTER — Other Ambulatory Visit (HOSPITAL_COMMUNITY): Payer: Self-pay | Admitting: Cardiology

## 2023-07-25 ENCOUNTER — Other Ambulatory Visit: Payer: Self-pay

## 2023-07-30 ENCOUNTER — Encounter (HOSPITAL_COMMUNITY): Payer: Self-pay

## 2023-08-08 NOTE — Progress Notes (Signed)
Advanced Heart Failure Clinic Note    PCP: [facility] Primary Cardiologist: Debbe Odea, MD  HF Cardiologist: Dr. Gala Romney  HPI: Tyler Ochoa. Tyler Ochoa is a 72 y.o. male with history of CAD, chronic systolic CHF/CM, alcohol use with cirrhosis and ascites, CKD II-III, HTN, HLD, recently discovered renal mass and right lung nodule (CT chest 10/22).   Cardiac history as follows: Echo at PCP's office 01/27/2021: EF 25-30%, severe concentric LVH, severely reduced RV, severe BAE, mild MR. Findings on echo concerning for infiltrative CM. cMRI recommended.  Later admitted 02/02/21 with acute systolic CHF c/b hepatorenal syndrome. Diuresed with low-dose dopamine and IV lasix.   Admitted to [facility] 10/22 with a/c CHF. BNP 4339. Echo with EF < 20%, moderately reduced RV, mild MR. Diuresed with lasix gtt. Hs troponin up to 420. R/LHC 09/08/21 with CTO mid LAD, moderate disease Lcx and RCA, RA 6, PA 55/20 (32), PCWP 16, Fick CO 5.2/CI 2.3, PA sat 69%. Plan to consider viability study at later date once stable. Initiation of GDMT limited by hypotension.   cMRI 11/03/21: LVEF 12%, severely reduced RV with RVEF 18%, extensive LGE throughout LV myocardium and patchy LGE in RV, elevated T1 and ECV. 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 Moderate right and small left pleural effusions.  He was markedly volume overloaded 1/23. Lasix stopped, torsemide 40 started.   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  Echo 9/23 showed EF 30-35% with RV involvement.  Today he returns for HF follow up with his wife. Overall feeling fine. Physically limited by knee OA, but no SOB walking on flat ground or with ADLs, uses a rolling walker. No neuropathy symptoms. Denies palpitations, CP, dizziness, edema, or PND/Orthopnea. Appetite ok. No fever or chills. Weight at home 255 pounds. Taking all medications. Wife asking about ED  meds.  FH: Mother had HF in her 41s. Brother passed from sudden death at age of 34.  Past Medical History:  Diagnosis Date   CHF (congestive heart failure) (HCC)    Chronic kidney disease    Hyperlipidemia    Hypertension    Peripheral edema    Current Outpatient Medications  Medication Sig Dispense Refill   aspirin EC 81 MG tablet Take 1 tablet (81 mg total) by mouth daily. Swallow whole. 30 tablet 5   carvedilol (COREG) 3.125 MG tablet TAKE 1 TABLET BY MOUTH TWICE DAILY WITH A MEAL 180 tablet 0   dapagliflozin propanediol (FARXIGA) 10 MG TABS tablet Take 1 tablet (10 mg total) by mouth daily before breakfast. 90 tablet 3   potassium chloride SA (KLOR-CON M20) 20 MEQ tablet Take 2 tablets (40 mEq total) by mouth daily. 30 tablet 0   PROAIR HFA 108 (90 Base) MCG/ACT inhaler Inhale 1-2 puffs into the lungs every 6 (six) hours as needed for wheezing.     sacubitril-valsartan (ENTRESTO) 24-26 MG Take 1 tablet by mouth 2 (two) times daily. 180 tablet 3   spironolactone (ALDACTONE) 25 MG tablet Take 1/2 (one-half) tablet by mouth once daily 45 tablet 0   Tafamidis (VYNDAMAX) 61 MG CAPS Take 1 capsule by mouth daily. 30 capsule 11   torsemide (DEMADEX) 20 MG tablet Take 2 tablets by mouth once daily 180 tablet 0   No current facility-administered medications for this encounter.   No Known Allergies  Social History   Socioeconomic History   Marital status: Single    Spouse name: Not on file   Number of  children: Not on file   Years of education: Not on file   Highest education level: Not on file  Occupational History   Not on file  Tobacco Use   Smoking status: Never   Smokeless tobacco: Never  Vaping Use   Vaping status: Never Used  Substance and Sexual Activity   Alcohol use: Not on file   Drug use: Not on file   Sexual activity: Not on file  Other Topics Concern   Not on file  Social History Narrative   ** Merged History Encounter **       Social Determinants of  Health   Financial Resource Strain: Not on file  Food Insecurity: Not on file  Transportation Needs: Not on file  Physical Activity: Not on file  Stress: Not on file  Social Connections: Not on file  Intimate Partner Violence: Not on file    No family history on file.  BP (!) 118/90   Pulse 82   Wt 118 kg (260 lb 3.2 oz)   SpO2 96%   BMI 33.41 kg/m   Wt Readings from Last 3 Encounters:  08/09/23 118 kg (260 lb 3.2 oz)  11/03/22 102.2 kg (225 lb 5 oz)  08/06/22 102.2 kg (225 lb 3.2 oz)   PHYSICAL EXAM: General:  NAD. No resp difficulty, walked into clinic HEENT: Normal Neck: Supple. No JVD. Carotids 2+ bilat; no bruits. No lymphadenopathy or thryomegaly appreciated. Cor: PMI nondisplaced. Regular rate & rhythm. No rubs, gallops or murmurs. Lungs: Clear Abdomen: Soft, nontender, nondistended. No hepatosplenomegaly. No bruits or masses. Good bowel sounds. Extremities: No cyanosis, clubbing, rash, edema; bilateral knee braces on Neuro: Alert & oriented x 3, cranial nerves grossly intact. Moves all 4 extremities w/o difficulty. Affect pleasant.  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 Moderate right and small left pleural effusions. - PYP (2/23) markedly positive.  - Echo (08/06/22): EF 30-35% severe LVH moderate RV HK  - NYHA IIb-III, physically limited by  bilateral knee OA. Volume looks good today, ReDs 29% - Continue torsemide 40 mg daily. - Continue spironolactone 12.5 mg daily. - Continue Entresto 24/26 mg bid, unable to uptitrate w/ BP - Continue Farxiga 10 mg daily. No GU symptoms - Continue carvedilol 3.125 mg bid. - SPEP - polyclonal increase. No M-spike. Urine IFE negative. - PYP markedly positive. - Genetic testing +. Needs 1st degree relatives screened. We discussed this again today, and he declines. - Check labs today - Update echo.  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 - Myeloma panel negative. - Genetic testing +  Val142lle. Discussed Amvuttra today, however he declines. - PYP 2/23 markedly positive. - Continue Tafamadis. - He has declined referral for genetic counseling. We discussed this again today.  3. CAD - Cath (11/22): CTO LAD with collaterals, moderate disease Lcx and RCA -  Managed medically. - No s/s angina - Continue aspirin. - Off statin with elevated LFTs.  4. CKD IIIb - Baseline SCr 1.4-1.6 - Continue Farxiga. - Labs today.  5. HTN - BP stable. - Continue GDMT.  6. HLD - Off statin per primary cardiologist given elevated LFTs.  7. Liver cirrhosis - Noted on US abdomen. - No more ETOH.  8. ED - Most recent echo with moderate RV dysfunction - Cautious use of low-dose PDE5i probably ok, mostly concerned about orthostasis - Defer to PCP  Follow up in 3-4 months with Dr. Gala Romney. Update echo.  Tyler Rome, FNP-BC 08/09/23

## 2023-08-09 ENCOUNTER — Other Ambulatory Visit (HOSPITAL_COMMUNITY): Payer: Self-pay

## 2023-08-09 ENCOUNTER — Encounter (HOSPITAL_COMMUNITY): Payer: Self-pay

## 2023-08-09 ENCOUNTER — Ambulatory Visit (HOSPITAL_COMMUNITY)
Admission: RE | Admit: 2023-08-09 | Discharge: 2023-08-09 | Disposition: A | Discharge status: Home / Self Care | Payer: Medicare HMO | Source: Ambulatory Visit | Attending: Family Medicine | Admitting: Family Medicine

## 2023-08-09 ENCOUNTER — Telehealth (HOSPITAL_COMMUNITY): Payer: Self-pay

## 2023-08-09 VITALS — BP 118/90 | HR 82 | Wt 260.2 lb

## 2023-08-09 DIAGNOSIS — M170 Bilateral primary osteoarthritis of knee: Secondary | ICD-10-CM | POA: Diagnosis not present

## 2023-08-09 DIAGNOSIS — I130 Hypertensive heart and chronic kidney disease with heart failure and stage 1 through stage 4 chronic kidney disease, or unspecified chronic kidney disease: Secondary | ICD-10-CM | POA: Insufficient documentation

## 2023-08-09 DIAGNOSIS — N529 Male erectile dysfunction, unspecified: Secondary | ICD-10-CM

## 2023-08-09 DIAGNOSIS — I5023 Acute on chronic systolic (congestive) heart failure: Secondary | ICD-10-CM | POA: Diagnosis present

## 2023-08-09 DIAGNOSIS — Z79899 Other long term (current) drug therapy: Secondary | ICD-10-CM | POA: Insufficient documentation

## 2023-08-09 DIAGNOSIS — Z7984 Long term (current) use of oral hypoglycemic drugs: Secondary | ICD-10-CM | POA: Insufficient documentation

## 2023-08-09 DIAGNOSIS — I5022 Chronic systolic (congestive) heart failure: Secondary | ICD-10-CM

## 2023-08-09 DIAGNOSIS — N1832 Chronic kidney disease, stage 3b: Secondary | ICD-10-CM | POA: Diagnosis not present

## 2023-08-09 DIAGNOSIS — K7031 Alcoholic cirrhosis of liver with ascites: Secondary | ICD-10-CM | POA: Insufficient documentation

## 2023-08-09 DIAGNOSIS — E785 Hyperlipidemia, unspecified: Secondary | ICD-10-CM | POA: Diagnosis not present

## 2023-08-09 DIAGNOSIS — K7460 Unspecified cirrhosis of liver: Secondary | ICD-10-CM | POA: Insufficient documentation

## 2023-08-09 DIAGNOSIS — J90 Pleural effusion, not elsewhere classified: Secondary | ICD-10-CM | POA: Diagnosis not present

## 2023-08-09 DIAGNOSIS — Z7982 Long term (current) use of aspirin: Secondary | ICD-10-CM | POA: Insufficient documentation

## 2023-08-09 DIAGNOSIS — E854 Organ-limited amyloidosis: Secondary | ICD-10-CM | POA: Diagnosis not present

## 2023-08-09 DIAGNOSIS — J8410 Pulmonary fibrosis, unspecified: Secondary | ICD-10-CM | POA: Diagnosis not present

## 2023-08-09 DIAGNOSIS — I43 Cardiomyopathy in diseases classified elsewhere: Secondary | ICD-10-CM | POA: Insufficient documentation

## 2023-08-09 DIAGNOSIS — I2510 Atherosclerotic heart disease of native coronary artery without angina pectoris: Secondary | ICD-10-CM | POA: Insufficient documentation

## 2023-08-09 DIAGNOSIS — I10 Essential (primary) hypertension: Secondary | ICD-10-CM

## 2023-08-09 LAB — BASIC METABOLIC PANEL
Anion gap: 13 (ref 5–15)
BUN: 42 mg/dL — ABNORMAL HIGH (ref 8–23)
CO2: 20 mmol/L — ABNORMAL LOW (ref 22–32)
Calcium: 9.1 mg/dL (ref 8.9–10.3)
Chloride: 99 mmol/L (ref 98–111)
Creatinine, Ser: 2.17 mg/dL — ABNORMAL HIGH (ref 0.61–1.24)
GFR, Estimated: 32 mL/min — ABNORMAL LOW (ref >60)
Glucose, Bld: 96 mg/dL (ref 70–99)
Potassium: 4.7 mmol/L (ref 3.5–5.1)
Sodium: 132 mmol/L — ABNORMAL LOW (ref 135–145)

## 2023-08-09 LAB — BRAIN NATRIURETIC PEPTIDE: B Natriuretic Peptide: 185.7 pg/mL — ABNORMAL HIGH (ref 0.0–100.0)

## 2023-08-09 MED ORDER — CARVEDILOL 3.125 MG PO TABS: 3.1250 mg | ORAL_TABLET | Freq: Two times a day (BID) | ORAL | 2 refills | Status: DC

## 2023-08-09 MED ORDER — TORSEMIDE 20 MG PO TABS: 40.0000 mg | ORAL_TABLET | Freq: Every day | ORAL | 2 refills | Status: DC

## 2023-08-09 MED ORDER — SPIRONOLACTONE 25 MG PO TABS: 12.5000 mg | ORAL_TABLET | Freq: Every day | ORAL | 3 refills | Status: DC

## 2023-08-09 MED ORDER — POTASSIUM CHLORIDE CRYS ER 20 MEQ PO TBCR: 40.0000 meq | EXTENDED_RELEASE_TABLET | Freq: Every day | ORAL | 6 refills | Status: DC

## 2023-08-09 NOTE — Progress Notes (Signed)
ReDS Vest / Clip - 08/09/23 1300       ReDS Vest / Clip   Station Marker D    Ruler Value 37    ReDS Value Range Low volume    ReDS Actual Value 29

## 2023-08-09 NOTE — Telephone Encounter (Addendum)
Pt aware, agreeable, and verbalized understanding  Labs scheduled   ----- Message from Jacklynn Ganong sent at 08/09/2023  4:25 PM EDT ----- Kidney function up a bit,  Repeat BMET in 2 weeks to follow

## 2023-08-09 NOTE — Patient Instructions (Addendum)
Thank you for coming in today  If you had labs drawn today, any labs that are abnormal the clinic will call you No news is good news  Medications: No changes    Follow up appointments:  Your physician recommends that you schedule a follow-up appointment in:  3-4 months With Dr. Gala Romney You will receive a reminder letter in the mail a few months in advance. If you don't receive a letter, please call our office to schedule the follow-up appointment.  Your physician has requested that you have an echocardiogram. Echocardiography is a painless test that uses sound waves to create images of your heart. It provides your doctor with information about the size and shape of your heart and how well your heart's chambers and valves are working. This procedure takes approximately one hour. There are no restrictions for this procedure.      Do the following things EVERYDAY: Weigh yourself in the morning before breakfast. Write it down and keep it in a log. Take your medicines as prescribed Eat low salt foods--Limit salt (sodium) to 2000 mg per day.  Stay as active as you can everyday Limit all fluids for the day to less than 2 liters   At the Advanced Heart Failure Clinic, you and your health needs are our priority. As part of our continuing mission to provide you with exceptional heart care, we have created designated Provider Care Teams. These Care Teams include your primary Cardiologist (physician) and Advanced Practice Providers (APPs- Physician Assistants and Nurse Practitioners) who all work together to provide you with the care you need, when you need it.   You may see any of the following providers on your designated Care Team at your next follow up: Dr Arvilla Meres Dr Marca Ancona Dr. Marcos Eke, NP Robbie Lis, [place] [facility] [place], [place] Brynda Peon, NP Karle Plumber, PharmD   Please be sure to bring in all your medications bottles to every  appointment.    Thank you for choosing [facility]  If you have any questions or concerns before your next appointment please send [place] a message through [place] or call our office at [phone].    TO LEAVE A MESSAGE FOR THE NURSE SELECT OPTION 2, PLEASE LEAVE A MESSAGE INCLUDING: YOUR NAME DATE OF BIRTH CALL BACK NUMBER REASON FOR CALL**this is important as we prioritize the call backs  YOU WILL RECEIVE A CALL BACK THE SAME DAY AS LONG AS YOU CALL BEFORE 4:00 PM

## 2023-08-10 ENCOUNTER — Other Ambulatory Visit (HOSPITAL_COMMUNITY): Payer: Self-pay | Admitting: Cardiology

## 2023-08-10 DIAGNOSIS — I5022 Chronic systolic (congestive) heart failure: Secondary | ICD-10-CM

## 2023-08-10 NOTE — Addendum Note (Signed)
Addended by: Theresia Bough on: 08/10/2023 04:41 PM   Modules accepted: Orders

## 2023-08-17 ENCOUNTER — Other Ambulatory Visit: Payer: Self-pay

## 2023-08-17 NOTE — Progress Notes (Signed)
Specialty Pharmacy Refill Coordination Note  Tyler Ochoa is a 72 y.o. male contacted today regarding refills of specialty medication(s) Tafamidis   Patient requested Delivery   Delivery date: 08/24/23   Verified address: [address] [name] [address]   Medication will be filled on 08/23/23.

## 2023-08-22 ENCOUNTER — Other Ambulatory Visit
Admission: RE | Admit: 2023-08-22 | Discharge: 2023-08-22 | Disposition: A | Discharge status: Home / Self Care | Payer: Medicare HMO | Source: Ambulatory Visit | Attending: Family Medicine | Admitting: Family Medicine

## 2023-08-22 ENCOUNTER — Other Ambulatory Visit (HOSPITAL_COMMUNITY): Payer: Medicare HMO

## 2023-08-22 DIAGNOSIS — I5022 Chronic systolic (congestive) heart failure: Secondary | ICD-10-CM | POA: Insufficient documentation

## 2023-08-22 LAB — BASIC METABOLIC PANEL
Anion gap: 11 (ref 5–15)
BUN: 37 mg/dL — ABNORMAL HIGH (ref 8–23)
CO2: 23 mmol/L (ref 22–32)
Calcium: 9.1 mg/dL (ref 8.9–10.3)
Chloride: 100 mmol/L (ref 98–111)
Creatinine, Ser: 1.91 mg/dL — ABNORMAL HIGH (ref 0.61–1.24)
GFR, Estimated: 37 mL/min — ABNORMAL LOW (ref >60)
Glucose, Bld: 123 mg/dL — ABNORMAL HIGH (ref 70–99)
Potassium: 4.1 mmol/L (ref 3.5–5.1)
Sodium: 134 mmol/L — ABNORMAL LOW (ref 135–145)

## 2023-08-23 ENCOUNTER — Other Ambulatory Visit: Payer: Self-pay

## 2023-09-08 ENCOUNTER — Other Ambulatory Visit: Payer: Self-pay

## 2023-09-08 ENCOUNTER — Other Ambulatory Visit (HOSPITAL_COMMUNITY): Payer: Self-pay

## 2023-09-08 NOTE — Progress Notes (Signed)
Specialty Pharmacy Refill Coordination Note  Tyler Ochoa is a 72 y.o. male contacted today regarding refills of specialty medication(s) Tafamidis   Patient requested Delivery   Delivery date: 09/22/23   Verified address: [address] [name] [address]   Medication will be filled on 09/21/23.

## 2023-09-09 ENCOUNTER — Ambulatory Visit (HOSPITAL_COMMUNITY)
Admission: RE | Admit: 2023-09-09 | Discharge: 2023-09-09 | Disposition: A | Discharge status: Home / Self Care | Payer: Medicare HMO | Source: Ambulatory Visit | Attending: Internal Medicine | Admitting: Internal Medicine

## 2023-09-09 DIAGNOSIS — I110 Hypertensive heart disease with heart failure: Secondary | ICD-10-CM | POA: Insufficient documentation

## 2023-09-09 DIAGNOSIS — I5022 Chronic systolic (congestive) heart failure: Secondary | ICD-10-CM

## 2023-09-09 DIAGNOSIS — I509 Heart failure, unspecified: Secondary | ICD-10-CM | POA: Diagnosis not present

## 2023-09-09 LAB — ECHOCARDIOGRAM COMPLETE
AR max vel: 1.35 cm2
AV Area VTI: 1.39 cm2
AV Area mean vel: 1.2 cm2
AV Mean grad: 4 mm[Hg]
AV Peak grad: 6 mm[Hg]
Ao pk vel: 1.22 m/s
Area-P 1/2: 3.28 cm2
S' Lateral: 4 cm

## 2023-09-13 ENCOUNTER — Other Ambulatory Visit: Payer: Self-pay

## 2023-09-21 ENCOUNTER — Other Ambulatory Visit: Payer: Self-pay

## 2023-10-13 ENCOUNTER — Telehealth (HOSPITAL_COMMUNITY): Payer: Self-pay | Admitting: Pharmacy Technician

## 2023-10-13 NOTE — Telephone Encounter (Signed)
Advanced Heart Failure Patient Advocate Encounter  The patient was approved for a [employer] grant that will help cover the cost of Vyndamax. Total amount awarded, $10,000. Eligibility, 09/13/23 - 09/11/24.  ID [id number]  BIN [id number]  PCN [id number]  Group [id number]

## 2023-10-17 ENCOUNTER — Other Ambulatory Visit: Payer: Self-pay

## 2023-10-17 ENCOUNTER — Encounter (HOSPITAL_COMMUNITY): Payer: Self-pay

## 2023-10-17 NOTE — Progress Notes (Signed)
Specialty Pharmacy Ongoing Clinical Assessment Note  Tyler Ochoa is a 72 y.o. male who is being followed by the specialty pharmacy service for RxSp Cardiology   Patient's specialty medication(s) reviewed today: Tafamidis   Missed doses in the last 4 weeks: 0   Patient/Caregiver did not have any additional questions or concerns.   Therapeutic benefit summary: Patient is achieving benefit   Adverse events/side effects summary: No adverse events/side effects   Patient's therapy is appropriate to: Continue    Goals Addressed             This Visit's Progress    Stabilization of disease       Patient is on track. Patient will maintain adherence         Follow up:  6 months  Otto Herb Specialty Pharmacist

## 2023-10-17 NOTE — Progress Notes (Signed)
Specialty Pharmacy Refill Coordination Note  Tyler Ochoa is a 73 y.o. male contacted today regarding refills of specialty medication(s) Tafamidis   Patient requested Delivery   Delivery date: 10/21/23   Verified address: [address] [name] [address]   Medication will be filled on 10/20/23.

## 2023-10-26 ENCOUNTER — Other Ambulatory Visit (HOSPITAL_COMMUNITY): Payer: Self-pay

## 2023-11-08 ENCOUNTER — Other Ambulatory Visit: Payer: Self-pay

## 2023-11-11 ENCOUNTER — Other Ambulatory Visit: Payer: Self-pay

## 2023-11-17 ENCOUNTER — Other Ambulatory Visit: Payer: Self-pay

## 2023-11-17 NOTE — Progress Notes (Signed)
 Specialty Pharmacy Refill Coordination Note  Tyler Ochoa is a 73 y.o. male contacted today regarding refills of specialty medication(s) Tafamidis  (Vyndamax )   Patient requested Delivery   Delivery date: 11/24/23   Verified address: [address] [place] [address]   Medication will be filled on 11/23/23.

## 2023-11-21 ENCOUNTER — Other Ambulatory Visit (HOSPITAL_COMMUNITY): Payer: Self-pay

## 2023-11-21 ENCOUNTER — Telehealth (HOSPITAL_COMMUNITY): Payer: Self-pay | Admitting: Pharmacy Technician

## 2023-11-21 NOTE — Telephone Encounter (Signed)
 Patient Advocate Encounter   Received notification from [facility] that prior authorization for Vyndamax is required.   PA submitted on CoverMyMeds Key [id number] Status is pending   Will continue to follow.

## 2023-11-22 NOTE — Telephone Encounter (Signed)
 Advanced Heart Failure Patient Advocate Encounter  Prior Authorization for Jeannie Fend has been approved.    PA# [id number] Effective dates: 11/09/23 through 11/07/24  Archer Asa, CPhT

## 2023-11-23 ENCOUNTER — Other Ambulatory Visit: Payer: Self-pay

## 2023-12-08 ENCOUNTER — Other Ambulatory Visit (HOSPITAL_COMMUNITY): Payer: Self-pay | Admitting: Family Medicine

## 2023-12-16 ENCOUNTER — Other Ambulatory Visit (HOSPITAL_COMMUNITY): Payer: Self-pay

## 2023-12-19 ENCOUNTER — Other Ambulatory Visit: Payer: Self-pay

## 2023-12-19 ENCOUNTER — Other Ambulatory Visit (HOSPITAL_COMMUNITY): Payer: Self-pay | Admitting: Internal Medicine

## 2023-12-19 ENCOUNTER — Other Ambulatory Visit (HOSPITAL_COMMUNITY): Payer: Self-pay

## 2023-12-19 NOTE — Progress Notes (Signed)
 Specialty Pharmacy Refill Coordination Note  Tyler Ochoa is a 73 y.o. male contacted today regarding refills of specialty medication(s) Tafamidis  (Vyndamax )   Patient requested Delivery   Delivery date: 12/21/23   Verified address: [address] [name]   [place] [address]   Medication will be filled on 12/20/23.   Pending refill request  This fill date is pending response to refill request from provider. Patient is aware and if they have not received fill by intended date, they must follow up with pharmacy.

## 2023-12-20 ENCOUNTER — Other Ambulatory Visit (HOSPITAL_COMMUNITY): Payer: Self-pay

## 2023-12-21 ENCOUNTER — Other Ambulatory Visit (HOSPITAL_COMMUNITY): Payer: Self-pay

## 2023-12-21 ENCOUNTER — Other Ambulatory Visit: Payer: Self-pay

## 2023-12-21 MED ORDER — SACUBITRIL-VALSARTAN 24-26 MG PO TABS
1.0000 | ORAL_TABLET | Freq: Two times a day (BID) | ORAL | 3 refills | Status: AC
  Filled 2023-12-21: qty 180, 90d supply, fill #0
  Filled 2024-03-12: qty 180, 90d supply, fill #1
  Filled 2024-06-22: qty 180, 90d supply, fill #2
  Filled 2024-08-30 – 2024-09-05 (×2): qty 180, 90d supply, fill #3

## 2023-12-21 MED ORDER — VYNDAMAX 61 MG PO CAPS
1.0000 | ORAL_CAPSULE | Freq: Every day | ORAL | 11 refills | Status: DC
  Filled 2023-12-21: qty 30, 30d supply, fill #0
  Filled 2024-01-10: qty 30, 30d supply, fill #1
  Filled 2024-02-15: qty 30, 30d supply, fill #2
  Filled 2024-03-15: qty 30, 30d supply, fill #3
  Filled 2024-04-12: qty 30, 30d supply, fill #4
  Filled 2024-05-14: qty 30, 30d supply, fill #5
  Filled 2024-06-12: qty 30, 30d supply, fill #6
  Filled 2024-07-12: qty 30, 30d supply, fill #7
  Filled 2024-08-09: qty 30, 30d supply, fill #8
  Filled 2024-09-12: qty 30, 30d supply, fill #9
  Filled 2024-10-10: qty 30, 30d supply, fill #10
  Filled 2024-11-07 – 2024-11-14 (×3): qty 30, 30d supply, fill #11

## 2024-01-10 ENCOUNTER — Other Ambulatory Visit: Payer: Self-pay

## 2024-01-10 NOTE — Progress Notes (Signed)
 Specialty Pharmacy Refill Coordination Note  Tyler Ochoa is a 73 y.o. male contacted today regarding refills of specialty medication(s) Tafamidis [facility])   Patient requested (Patient-Rptd) Delivery   Delivery date: (Patient-Rptd) 01/26/24   Verified address: (Patient-Rptd) [address], [address] , [place]  [address]   Medication will be filled on 03.19.25.

## 2024-01-16 ENCOUNTER — Telehealth: Payer: Self-pay | Admitting: Internal Medicine

## 2024-01-16 NOTE — Telephone Encounter (Signed)
 Lvm to confirm appt on 01/17/24

## 2024-01-17 ENCOUNTER — Ambulatory Visit: Payer: Medicare HMO | Attending: Internal Medicine | Admitting: Internal Medicine

## 2024-01-17 ENCOUNTER — Encounter: Payer: Self-pay | Admitting: Internal Medicine

## 2024-01-17 VITALS — BP 97/70 | HR 81 | Wt 251.6 lb

## 2024-01-17 DIAGNOSIS — N1832 Chronic kidney disease, stage 3b: Secondary | ICD-10-CM

## 2024-01-17 DIAGNOSIS — M130 Polyarthritis, unspecified: Secondary | ICD-10-CM

## 2024-01-17 DIAGNOSIS — E854 Organ-limited amyloidosis: Secondary | ICD-10-CM | POA: Diagnosis not present

## 2024-01-17 DIAGNOSIS — I5022 Chronic systolic (congestive) heart failure: Secondary | ICD-10-CM

## 2024-01-17 DIAGNOSIS — I10 Essential (primary) hypertension: Secondary | ICD-10-CM | POA: Diagnosis not present

## 2024-01-17 DIAGNOSIS — R188 Other ascites: Secondary | ICD-10-CM

## 2024-01-17 DIAGNOSIS — K7460 Unspecified cirrhosis of liver: Secondary | ICD-10-CM

## 2024-01-17 DIAGNOSIS — I43 Cardiomyopathy in diseases classified elsewhere: Secondary | ICD-10-CM

## 2024-01-17 NOTE — Patient Instructions (Signed)
 Great to see you today!!!  Medication Changes:  None, continue current medications  Lab Work:  Go DOWN to LOWER LEVEL (LL) to have your blood work completed inside of [employer] office.  We will only call you if the results are abnormal or if the provider would like to make medication changes. Labs done today, your results will be available in MyChart, we will contact you for abnormal readings.  Special Instructions // Education:  Do the following things EVERYDAY: Weigh yourself in the morning before breakfast. Write it down and keep it in a log. Take your medicines as prescribed Eat low salt foods--Limit salt (sodium) to 2000 mg per day.  Stay as active as you can everyday Limit all fluids for the day to less than 2 liters   Follow-Up in: 6 months (Sept), **We will call you closer to then to schedule this appointment    If you have any questions or concerns before your next appointment please send [place] a message through [facility] or call our office at [phone] Monday-Friday 8 am-5 pm.   If you have an urgent need after hours on the weekend please call your Primary Cardiologist or the Advanced Heart Failure Clinic in [place] at [phone].   At the Advanced Heart Failure Clinic, you and your health needs are our priority. We have a designated team specialized in the treatment of Heart Failure. This Care Team includes your primary Heart Failure Specialized Cardiologist (physician), Advanced Practice Providers (APPs- Physician Assistants and Nurse Practitioners), and Pharmacist who all work together to provide you with the care you need, when you need it.   You may see any of the following providers on your designated Care Team at your next follow up:  Dr. Arvilla Meres Dr. Marca Ancona Dr. Dorthula Nettles Dr. Theresia Bough Tonye Becket, NP Robbie Lis, [place] [address] [place], [place] Brynda Peon, NP [place] Lee, NP Clarisa Kindred, NP Enos Fling, PharmD

## 2024-01-17 NOTE — Progress Notes (Signed)
 Advanced Heart Failure Clinic Note    PCP: [facility] Primary Cardiologist: Debbe Odea, MD  HF Cardiologist: Dr. Gala Romney  Chief complaint: Heart failure  HPI: Tyler Ochoa. Tyler Ochoa is a 73 y.o. male with history of CAD, chronic systolic CHF/CM, alcohol use with cirrhosis and ascites, CKD II-III, HTN, HLD, recently discovered renal mass and right lung nodule (CT chest 10/22).   Cardiac history as follows: Echo at PCP's office 01/27/2021: EF 25-30%, severe concentric LVH, severely reduced RV, severe BAE, mild MR. Findings on echo concerning for infiltrative CM. cMRI recommended.  Later admitted 02/02/21 with acute systolic CHF c/b hepatorenal syndrome. Diuresed with low-dose dopamine and IV lasix.   Admitted to [facility] 10/22 with a/c CHF. BNP 4339. Echo with EF < 20%, moderately reduced RV, mild MR. Diuresed with lasix gtt. Hs troponin up to 420. R/LHC 09/08/21 with CTO mid LAD, moderate disease Lcx and RCA, RA 6, PA 55/20 (32), PCWP 16, Fick CO 5.2/CI 2.3, PA sat 69%. Plan to consider viability study at later date once stable. Initiation of GDMT limited by hypotension.   cMRI 11/03/21: LVEF 12%, severely reduced RV with RVEF 18%, extensive LGE throughout LV myocardium and patchy LGE in RV, elevated T1 and ECV. 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 Moderate right and small left pleural effusions.  He was markedly volume overloaded 1/23. Lasix stopped, torsemide 40 started.   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  Echo 9/23 showed EF 30-35% with RV involvement.  Echo 11/24 EF 25-30% Moderate RV hk  (I thought 30-35%) Personally reviewed  Today he returns for HF follow up with his wife. Remains on tafamadis. 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 Has never been tested for gout. He stays in [facility] most of the time. Not getting around as much. Denies SOB. No swelling. SBP at home  110-120. No problems with meds   FH: Mother had HF in her 69s. Brother passed from sudden death at age of 38.  Past Medical History:  Diagnosis Date   CHF (congestive heart failure) (HCC)    Chronic kidney disease    Hyperlipidemia    Hypertension    Peripheral edema    Current Outpatient Medications  Medication Sig Dispense Refill   aspirin EC 81 MG tablet Take 81 mg by mouth daily. Swallow whole.     carvedilol (COREG) 3.125 MG tablet Take 1 tablet (3.125 mg total) by mouth 2 (two) times daily with a meal. 180 tablet 2   dapagliflozin propanediol (FARXIGA) 10 MG TABS tablet Take 1 tablet (10 mg total) by mouth daily before breakfast. 90 tablet 3   potassium chloride SA (KLOR-CON M20) 20 MEQ tablet Take 2 tablets (40 mEq total) by mouth daily. NEEDS FOLLOW UP APPOINTMENT FOR MORE REFILLS 180 tablet 0   PROAIR HFA 108 (90 Base) MCG/ACT inhaler Inhale 1-2 puffs into the lungs every 6 (six) hours as needed for wheezing.     sacubitril-valsartan (ENTRESTO) 24-26 MG Take 1 tablet by mouth 2 (two) times daily. 180 tablet 3   spironolactone (ALDACTONE) 25 MG tablet Take 0.5 tablets (12.5 mg total) by mouth daily. 45 tablet 3   Tafamidis (VYNDAMAX) 61 MG CAPS Take 1 capsule by mouth daily. 30 capsule 11   torsemide (DEMADEX) 20 MG tablet Take 2 tablets (40 mg total) by mouth daily. 180 tablet 2   No current facility-administered medications for this visit.   No Known Allergies  Social History   Socioeconomic History   Marital status: Single    Spouse name: Not on file   Number of children: Not on file   Years of education: Not on file   Highest education level: Not on file  Occupational History   Not on file  Tobacco Use   Smoking status: Never   Smokeless tobacco: Never  Vaping Use   Vaping status: Never Used  Substance and Sexual Activity   Alcohol use: Not on file   Drug use: Not on file   Sexual activity: Not on file  Other Topics Concern   Not on file  Social History  Narrative   ** Merged History Encounter **       Social Drivers of Health   Financial Resource Strain: Not on file  Food Insecurity: Not on file  Transportation Needs: Not on file  Physical Activity: Not on file  Stress: Not on file  Social Connections: Not on file  Intimate Partner Violence: Not on file    No family history on file.  BP 97/70   Pulse 81   Wt 251 lb 9.6 oz (114.1 kg)   SpO2 98%   BMI 32.30 kg/m   Wt Readings from Last 3 Encounters:  01/17/24 251 lb 9.6 oz (114.1 kg)  08/09/23 260 lb 3.2 oz (118 kg)  11/03/22 225 lb 5 oz (102.2 kg)   PHYSICAL EXAM: General:  Elderly No resp difficulty HEENT: normal Neck: supple. no JVD. Carotids 2+ bilat; no bruits. No lymphadenopathy or thryomegaly appreciated. Cor: PMI nondisplaced. Regular rate & rhythm. No rubs, gallops or murmurs. Lungs: clear Abdomen: soft, nontender, nondistended. No hepatosplenomegaly. No bruits or masses. Good bowel sounds. Extremities: no cyanosis, clubbing, rash, edema + bilateral knee brace Neuro: alert & orientedx3, cranial nerves grossly intact. moves all 4 extremities w/o difficulty. 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 Moderate right and small left pleural effusions. - PYP (2/23) markedly positive.  - Echo (08/06/22): EF 30-35% severe LVH moderate RV HK - Echo 11/24 EF 25-30% Moderate RV hk  (I  thought 30-35%) Personally reviewed  - NYHA III mostly limited by physical debility (WC bound) - Volume status ok Continue torsemide 40 mg daily - Continue spironolactone 12.5 mg daily. - Continue Entresto 24/26 mg bid, unable to uptitrate w/ BP - Continue Farxiga 10 mg daily. No GU symptoms - Continue carvedilol 3.125 mg bid. - SPEP - polyclonal increase. No M-spike. Urine IFE negative. - PYP markedly positive. - Genetic testing +.Refused family screening - Continue tafamadis - Labs today - Not candidate for ICD with degree of debility  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 - Myeloma panel negative. - Genetic testing +  Val142lle. Discussed Amvuttra today, however he declines. - PYP 2/23 markedly positive. - Continue tafamdis - He has declined referral for genetic counseling. No change -  Given stability of symptoms will not add Amvuttra at this time  3. CAD - Cath (11/22): CTO LAD with collaterals, moderate disease Lcx and RCA - Managed medically. - No s/s angina  - Continue aspirin. - Off statin with elevated LFTs.  4. CKD IIIb - Baseline SCr 1.4-1.6 - Continue Farxiga. - Labs today  5. HTN - BP stable  6. HLD - Off statin per primary cardiologist given elevated LFTs.  7. Liver cirrhosis - Noted on US abdomen. - Has stopped ETOH  8. Arthritis - sounds like gout - check uric acid  Arvilla Meres, MD  2:16 PM

## 2024-01-18 ENCOUNTER — Other Ambulatory Visit (HOSPITAL_COMMUNITY): Payer: Self-pay

## 2024-01-18 LAB — COMPREHENSIVE METABOLIC PANEL
ALT: 6 IU/L (ref 0–44)
AST: 22 IU/L (ref 0–40)
Albumin: 4.2 g/dL (ref 3.9–4.9)
Alkaline Phosphatase: 202 IU/L — ABNORMAL HIGH (ref 44–121)
BUN/Creatinine Ratio: 19 (ref 10–24)
BUN: 45 mg/dL — ABNORMAL HIGH (ref 8–27)
Bilirubin Total: 0.5 mg/dL (ref 0.0–1.2)
Calcium: 9.5 mg/dL (ref 8.6–10.2)
Chloride: 98 mmol/L (ref 96–106)
Creatinine, Ser: 2.39 mg/dL — ABNORMAL HIGH (ref 0.76–1.27)
Globulin, Total: 4.4 g/dL (ref 1.5–4.5)
Sodium: 136 mmol/L (ref 134–144)
Total Protein: 8.6 g/dL — ABNORMAL HIGH (ref 6.0–8.5)
eGFR: 28 mL/min/{1.73_m2} — ABNORMAL LOW (ref >59)

## 2024-01-18 LAB — URIC ACID: Uric Acid: 12.1 mg/dL — ABNORMAL HIGH (ref 3.8–8.4)

## 2024-01-25 ENCOUNTER — Other Ambulatory Visit: Payer: Self-pay

## 2024-01-31 ENCOUNTER — Telehealth (HOSPITAL_COMMUNITY): Payer: Self-pay | Admitting: Cardiology

## 2024-01-31 ENCOUNTER — Telehealth (HOSPITAL_COMMUNITY): Payer: Self-pay

## 2024-01-31 MED ORDER — ALLOPURINOL 200 MG PO TABS: 200.0000 mg | ORAL_TABLET | Freq: Every day | ORAL | 3 refills | Status: AC

## 2024-01-31 NOTE — Telephone Encounter (Signed)
 Pt checked the status of new  medication before heading to pharmacy   3/11 Labs results state to start allopurinol 200mg  daily Script sent

## 2024-01-31 NOTE — Telephone Encounter (Signed)
-----   Message from Arvilla Meres sent at 01/27/2024  5:11 PM EDT ----- Uric acid is high. Start allopurinol 200 daily

## 2024-01-31 NOTE — Telephone Encounter (Signed)
 Pt aware, agreeable, and verbalized understanding.   Pt's medication has already been sent in.

## 2024-02-15 ENCOUNTER — Other Ambulatory Visit: Payer: Self-pay

## 2024-02-15 NOTE — Progress Notes (Signed)
 Specialty Pharmacy Refill Coordination Note  Tyler Ochoa is a 73 y.o. male contacted today regarding refills of specialty medication(s) Tafamidis [facility])   Patient requested (Patient-Rptd) Delivery   Delivery date: (Patient-Rptd) 02/23/24   Verified address: (Patient-Rptd) [address], [place],  [address]   Medication will be filled on 04.16.25.

## 2024-02-21 ENCOUNTER — Other Ambulatory Visit: Payer: Self-pay

## 2024-02-22 ENCOUNTER — Other Ambulatory Visit: Payer: Self-pay

## 2024-03-03 ENCOUNTER — Other Ambulatory Visit (HOSPITAL_COMMUNITY): Payer: Self-pay | Admitting: Family Medicine

## 2024-03-12 ENCOUNTER — Other Ambulatory Visit (HOSPITAL_COMMUNITY): Payer: Self-pay

## 2024-03-15 ENCOUNTER — Other Ambulatory Visit: Payer: Self-pay

## 2024-03-15 ENCOUNTER — Other Ambulatory Visit: Payer: Self-pay | Admitting: Pharmacy Technician

## 2024-03-15 ENCOUNTER — Other Ambulatory Visit (HOSPITAL_COMMUNITY): Payer: Self-pay

## 2024-03-15 NOTE — Progress Notes (Signed)
 Specialty Pharmacy Refill Coordination Note  Tyler Ochoa is a 73 y.o. male contacted today regarding refills of specialty medication(s) Tafamidis  (Vyndamax )   Patient requested Delivery   Delivery date: 03/22/24   Verified address: [address], [place] [address]   Medication will be filled on 03/21/24.

## 2024-03-16 ENCOUNTER — Other Ambulatory Visit: Payer: Self-pay

## 2024-03-22 ENCOUNTER — Other Ambulatory Visit (HOSPITAL_COMMUNITY): Payer: Self-pay

## 2024-03-26 ENCOUNTER — Telehealth: Payer: Self-pay | Admitting: Family

## 2024-04-12 ENCOUNTER — Other Ambulatory Visit: Payer: Self-pay

## 2024-04-12 NOTE — Progress Notes (Signed)
 Specialty Pharmacy Ongoing Clinical Assessment Note  MISTER KRAHENBUHL is a 73 y.o. male who is being followed by the specialty pharmacy service for RxSp Cardiology   Patient's specialty medication(s) reviewed today: Tafamidis  (Vyndamax )   Missed doses in the last 4 weeks: 0   Patient/Caregiver did not have any additional questions or concerns.   Therapeutic benefit summary: Patient is achieving benefit   Adverse events/side effects summary: No adverse events/side effects   Patient's therapy is appropriate to: Continue    Goals Addressed             This Visit's Progress    Stabilization of disease   On track    Patient is on track. Patient will maintain adherence. Per office visit on 01/17/24, patient disease/symptoms currently stable.          Follow up: 6 months  [facility]

## 2024-04-12 NOTE — Progress Notes (Signed)
 Specialty Pharmacy Refill Coordination Note  Tyler Ochoa is a 73 y.o. male contacted today regarding refills of specialty medication(s) Tafamidis  (Vyndamax )   Patient requested Delivery   Delivery date: 04/20/24   Verified address: [address], [address], [place] [address]   Medication will be filled on 06.12.25.

## 2024-04-13 ENCOUNTER — Other Ambulatory Visit (HOSPITAL_COMMUNITY): Payer: Self-pay

## 2024-04-13 MED ORDER — DAPAGLIFLOZIN PROPANEDIOL 10 MG PO TABS
10.0000 mg | ORAL_TABLET | Freq: Every day | ORAL | 1 refills | Status: AC
Start: 2024-04-13 — End: ?
  Filled 2024-04-13: qty 90, 90d supply, fill #0

## 2024-04-13 NOTE — Progress Notes (Signed)
 6 month supply of Farxiga  sent to [facility] order pharmacy.

## 2024-04-14 ENCOUNTER — Other Ambulatory Visit (HOSPITAL_COMMUNITY): Payer: Self-pay

## 2024-04-19 ENCOUNTER — Other Ambulatory Visit: Payer: Self-pay

## 2024-04-19 ENCOUNTER — Other Ambulatory Visit (HOSPITAL_COMMUNITY): Payer: Self-pay

## 2024-05-09 ENCOUNTER — Other Ambulatory Visit: Payer: Self-pay

## 2024-05-12 ENCOUNTER — Encounter (INDEPENDENT_AMBULATORY_CARE_PROVIDER_SITE_OTHER): Payer: Self-pay

## 2024-05-14 ENCOUNTER — Other Ambulatory Visit: Payer: Self-pay

## 2024-05-14 ENCOUNTER — Other Ambulatory Visit: Payer: Self-pay | Admitting: Pharmacy Technician

## 2024-05-14 NOTE — Progress Notes (Signed)
 Specialty Pharmacy Refill Coordination Note  Tyler Ochoa is a 73 y.o. male contacted today regarding refills of specialty medication(s) Tafamidis  (Vyndamax )   Patient requested Delivery  Delivery date: 05/17/2024 Verified address:   [address], [place], [address]    Medication will be filled on 05/16/2024.   Patient answered Questionnaire

## 2024-05-29 ENCOUNTER — Other Ambulatory Visit (HOSPITAL_COMMUNITY): Payer: Self-pay | Admitting: Family Medicine

## 2024-06-01 ENCOUNTER — Other Ambulatory Visit (HOSPITAL_COMMUNITY): Payer: Self-pay | Admitting: Internal Medicine

## 2024-06-12 ENCOUNTER — Other Ambulatory Visit: Payer: Self-pay

## 2024-06-12 ENCOUNTER — Encounter (INDEPENDENT_AMBULATORY_CARE_PROVIDER_SITE_OTHER): Payer: Self-pay

## 2024-06-12 ENCOUNTER — Other Ambulatory Visit: Payer: Self-pay | Admitting: Pharmacy Technician

## 2024-06-12 NOTE — Progress Notes (Signed)
 Specialty Pharmacy Refill Coordination Note  Tyler Ochoa is a 73 y.o. male contacted today regarding refills of specialty medication(s) Tafamidis  (Vyndamax    Patient requested (Patient-Rptd) Delivery   Delivery date: 06/20/24 Verified address: (Patient-Rptd) [address], [address], [name]   Medication will be filled on 06/19/24.

## 2024-06-22 ENCOUNTER — Other Ambulatory Visit: Payer: Self-pay

## 2024-06-25 ENCOUNTER — Other Ambulatory Visit (HOSPITAL_COMMUNITY): Payer: Self-pay | Admitting: Family Medicine

## 2024-07-12 ENCOUNTER — Encounter (INDEPENDENT_AMBULATORY_CARE_PROVIDER_SITE_OTHER): Payer: Self-pay

## 2024-07-12 ENCOUNTER — Other Ambulatory Visit: Payer: Self-pay

## 2024-07-12 ENCOUNTER — Other Ambulatory Visit: Payer: Self-pay | Admitting: Pharmacy Technician

## 2024-07-12 NOTE — Progress Notes (Signed)
 Specialty Pharmacy Refill Coordination Note  Tyler Ochoa is a 73 y.o. male contacted today regarding refills of specialty medication(s) Tafamidis  (Vyndamax )   Patient requested (Patient-Rptd) Delivery   Delivery date: 07/19/24 Verified address: (Patient-Rptd) [address]   [place] [address]   Medication will be filled on 07/18/24.

## 2024-07-17 ENCOUNTER — Other Ambulatory Visit: Payer: Self-pay

## 2024-07-18 ENCOUNTER — Other Ambulatory Visit: Payer: Self-pay

## 2024-08-09 ENCOUNTER — Other Ambulatory Visit: Payer: Self-pay

## 2024-08-09 ENCOUNTER — Other Ambulatory Visit (HOSPITAL_COMMUNITY): Payer: Self-pay

## 2024-08-09 NOTE — Progress Notes (Signed)
 Specialty Pharmacy Refill Coordination Note  Spoke with Tyler Ochoa is a 73 y.o. male contacted today regarding refills of specialty medication(s) Tafamidis  (Vyndamax )  Doses on hand: 15  Patient requested: Delivery   Delivery date: 08/21/24   Verified address: [address] [name] [address]  Medication will be filled on 08/20/24.

## 2024-08-20 ENCOUNTER — Other Ambulatory Visit: Payer: Self-pay

## 2024-08-21 ENCOUNTER — Other Ambulatory Visit (HOSPITAL_COMMUNITY): Payer: Self-pay

## 2024-08-23 ENCOUNTER — Other Ambulatory Visit (HOSPITAL_COMMUNITY): Payer: Self-pay | Admitting: Family Medicine

## 2024-08-24 ENCOUNTER — Other Ambulatory Visit: Payer: Self-pay

## 2024-08-24 MED ORDER — DAPAGLIFLOZIN PROPANEDIOL 10 MG PO TABS: 10.0000 mg | ORAL_TABLET | Freq: Every day | ORAL | 2 refills | Status: AC

## 2024-08-27 ENCOUNTER — Encounter: Admitting: Internal Medicine

## 2024-08-30 ENCOUNTER — Other Ambulatory Visit (HOSPITAL_COMMUNITY): Payer: Self-pay

## 2024-08-30 ENCOUNTER — Other Ambulatory Visit (HOSPITAL_COMMUNITY): Payer: Self-pay | Admitting: Internal Medicine

## 2024-08-31 ENCOUNTER — Other Ambulatory Visit (HOSPITAL_COMMUNITY): Payer: Self-pay | Admitting: Family Medicine

## 2024-09-03 ENCOUNTER — Telehealth: Payer: Self-pay | Admitting: Family

## 2024-09-03 NOTE — Telephone Encounter (Signed)
 Called to confirm/remind patient of their appointment at the Advanced Heart Failure Clinic on 09/04/24.   Appointment:   [x] Confirmed  [] Left mess   [] No answer/No voice mail  [] VM Full/unable to leave message  [] Phone not in service  Patient reminded to bring all medications and/or complete list.  Confirmed patient has transportation. Gave directions, instructed to utilize valet parking.

## 2024-09-04 ENCOUNTER — Ambulatory Visit: Admitting: Family

## 2024-09-04 NOTE — Progress Notes (Deleted)
 Advanced Heart Failure Clinic Note    PCP: [facility] Primary Cardiologist: Redell Cave, MD  HF Cardiologist: Dr. Cherrie  Chief complaint: Heart failure  HPI: Tyler Ochoa. Kowal is a 73 y.o. male with history of CAD, chronic systolic CHF/CM, alcohol use with cirrhosis and ascites, CKD II-III, HTN, HLD, recently discovered renal mass and right lung nodule (CT chest 10/22).   Cardiac history as follows: Echo at PCP's office 01/27/2021: EF 25-30%, severe concentric LVH, severely reduced RV, severe BAE, mild MR. Findings on echo concerning for infiltrative CM. cMRI recommended.  Later admitted 02/02/21 with acute systolic CHF c/b hepatorenal syndrome. Diuresed with low-dose dopamine  and IV lasix .   Admitted to [facility] 10/22 with a/c CHF. BNP 4339. Echo with EF < 20%, moderately reduced RV, mild MR. Diuresed with lasix  gtt. Hs troponin up to 420. R/LHC 09/08/21 with CTO mid LAD, moderate disease Lcx and RCA, RA 6, PA 55/20 (32), PCWP 16, Fick CO 5.2/CI 2.3, PA sat 69%. Plan to consider viability study at later date once stable. Initiation of GDMT limited by hypotension.   cMRI 11/03/21: LVEF 12%, severely reduced RV with RVEF 18%, extensive LGE throughout LV myocardium and patchy LGE in RV, elevated T1 and ECV. 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 Moderate right and small left pleural effusions.  He was markedly volume overloaded 1/23. Lasix  stopped, torsemide  40 started.   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  Echo 9/23 showed EF 30-35% with RV involvement.  Echo 11/24 EF 25-30% Moderate RV hk  (I thought 30-35%) Personally reviewed  Today he returns for HF follow up with his wife. Remains on tafamadis. 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 Has never been tested for gout. He stays in [facility] most of the time. Not getting around as much. Denies SOB. No swelling. SBP at home  110-120. No problems with meds   FH: Mother had HF in her 38s. Brother passed from sudden death at age of 58.  Past Medical History:  Diagnosis Date   CHF (congestive heart failure) (HCC)    Chronic kidney disease    Hyperlipidemia    Hypertension    Peripheral edema    Current Outpatient Medications  Medication Sig Dispense Refill   allopurinol  200 MG TABS Take 200 mg by mouth daily. 90 tablet 3   aspirin  EC 81 MG tablet Take 81 mg by mouth daily. Swallow whole.     carvedilol  (COREG ) 3.125 MG tablet TAKE 1 TABLET BY MOUTH TWICE DAILY WITH A MEAL 180 tablet 0   dapagliflozin  propanediol (FARXIGA ) 10 MG TABS tablet Take 1 tablet (10 mg total) by mouth daily before breakfast. 90 tablet 2   potassium chloride  SA (KLOR-CON  M) 20 MEQ tablet Take 2 tablets (40 mEq total) by mouth daily. 180 tablet 0   PROAIR  HFA 108 (90 Base) MCG/ACT inhaler Inhale 1-2 puffs into the lungs every 6 (six) hours as needed for wheezing.     sacubitril -valsartan  (ENTRESTO ) 24-26 MG Take 1 tablet by mouth 2 (two) times daily. 180 tablet 3   spironolactone  (ALDACTONE ) 25 MG tablet Take 1/2 (one-half) tablet by mouth once daily 45 tablet 0   Tafamidis  (VYNDAMAX ) 61 MG CAPS Take 1 capsule by mouth daily. 30 capsule 11   torsemide  (DEMADEX ) 20 MG tablet Take 2 tablets by mouth once daily 180 tablet 0   No current facility-administered medications for this visit.   No Known Allergies  Social History   Socioeconomic History   Marital status: Single    Spouse name: Not on file   Number of children: Not on file   Years of education: Not on file   Highest education level: Not on file  Occupational History   Not on file  Tobacco Use   Smoking status: Never   Smokeless tobacco: Never  Vaping Use   Vaping status: Never Used  Substance and Sexual Activity   Alcohol use: Not on file   Drug use: Not on file   Sexual activity: Not on file  Other Topics Concern   Not on file  Social History Narrative   **  Merged History Encounter **       Social Drivers of Health   Financial Resource Strain: Not on file  Food Insecurity: Not on file  Transportation Needs: Not on file  Physical Activity: Not on file  Stress: Not on file  Social Connections: Not on file  Intimate Partner Violence: Not on file    No family history on file.  There were no vitals taken for this visit.  Wt Readings from Last 3 Encounters:  01/17/24 114.1 kg  08/09/23 118 kg  11/03/22 102.2 kg   PHYSICAL EXAM: General:  Elderly No resp difficulty HEENT: normal Neck: supple. no JVD. Carotids 2+ bilat; no bruits. No lymphadenopathy or thryomegaly appreciated. Cor: PMI nondisplaced. Regular rate & rhythm. No rubs, gallops or murmurs. Lungs: clear Abdomen: soft, nontender, nondistended. No hepatosplenomegaly. No bruits or masses. Good bowel sounds. Extremities: no cyanosis, clubbing, rash, edema + bilateral knee brace Neuro: alert & orientedx3, cranial nerves grossly intact. moves all 4 extremities w/o difficulty. 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 Moderate right and small left pleural effusions. - PYP (2/23) markedly positive.  - Echo (08/06/22): EF 30-35% severe LVH moderate RV HK - Echo 11/24 EF 25-30% Moderate RV hk  (I thought 30-35%) Personally reviewed  - NYHA III mostly limited by physical debility (WC bound) - Volume  status ok Continue torsemide  40 mg daily - Continue spironolactone  12.5 mg daily. - Continue Entresto  24/26 mg bid, unable to uptitrate w/ BP - Continue Farxiga  10 mg daily. No GU symptoms - Continue carvedilol  3.125 mg bid. - SPEP - polyclonal increase. No M-spike. Urine IFE negative. - PYP markedly positive. - Genetic testing +.Refused family screening - Continue tafamadis - Labs today - Not candidate for ICD with degree of debility  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 - Myeloma panel negative. - Genetic testing +  Val142lle. Discussed Amvuttra today, however he declines. - PYP 2/23 markedly positive. - Continue tafamdis - He has declined referral for genetic counseling. No change - Given stability of symptoms will not add Amvuttra at this time  3. CAD - Cath (11/22): CTO LAD with collaterals, moderate disease Lcx and RCA - Managed medically. -  No s/s angina  - Continue aspirin . - Off statin with elevated LFTs.  4. CKD IIIb - Baseline SCr 1.4-1.6 - Continue Farxiga . - Labs today  5. HTN - BP stable  6. HLD - Off statin per primary cardiologist given elevated LFTs.  7. Liver cirrhosis - Noted on US  abdomen. - Has stopped ETOH  8. Arthritis - sounds like gout - check uric acid  Solomon Blumenthal, RN  12:19 AM

## 2024-09-05 ENCOUNTER — Other Ambulatory Visit (HOSPITAL_COMMUNITY): Payer: Self-pay

## 2024-09-10 ENCOUNTER — Other Ambulatory Visit (HOSPITAL_COMMUNITY): Payer: Self-pay | Admitting: Family Medicine

## 2024-09-12 ENCOUNTER — Other Ambulatory Visit: Payer: Self-pay | Admitting: Pharmacy Technician

## 2024-09-12 ENCOUNTER — Encounter (INDEPENDENT_AMBULATORY_CARE_PROVIDER_SITE_OTHER): Payer: Self-pay

## 2024-09-12 ENCOUNTER — Other Ambulatory Visit: Payer: Self-pay

## 2024-09-12 NOTE — Progress Notes (Signed)
 Specialty Pharmacy Refill Coordination Note  Tyler Ochoa is a 73 y.o. male contacted today regarding refills of specialty medication(s) Tafamidis  (Vyndamax )   Patient requested (Patient-Rptd) Delivery   Delivery date: 09/18/2024 Verified address: (Patient-Rptd) [address] [place] [address]   Medication will be filled on:09/17/2024

## 2024-09-17 ENCOUNTER — Other Ambulatory Visit: Payer: Self-pay

## 2024-09-17 ENCOUNTER — Telehealth: Payer: Self-pay | Admitting: Family

## 2024-09-17 NOTE — Progress Notes (Unsigned)
 Advanced Heart Failure Clinic Note    PCP: [facility] Primary Cardiologist: Redell Cave, MD  HF Cardiologist: Dr. Cherrie  Chief complaint: shortness of breath   HPI:  Tyler Ochoa is a 73 y.o. male with history of CAD, chronic systolic CHF/CM, alcohol use with cirrhosis and ascites, CKD II-III, HTN, HLD, recently discovered renal mass and right lung nodule (CT chest 10/22).   Cardiac history as follows: Echo at PCP's office 01/27/2021: EF 25-30%, severe concentric LVH, severely reduced RV, severe BAE, mild MR. Findings on echo concerning for infiltrative CM. cMRI recommended.  Later admitted 02/02/21 with acute systolic CHF c/b hepatorenal syndrome. Diuresed with low-dose dopamine  and IV lasix .   Admitted to [facility] 10/22 with a/c CHF. BNP 4339. Echo with EF < 20%, moderately reduced RV, mild MR. Diuresed with lasix  gtt. Hs troponin up to 420. R/LHC 09/08/21 with CTO mid LAD, moderate disease Lcx and RCA, RA 6, PA 55/20 (32), PCWP 16, Fick CO 5.2/CI 2.3, PA sat 69%. Plan to consider viability study at later date once stable. Initiation of GDMT limited by hypotension.   cMRI 11/03/21: LVEF 12%, severely reduced RV with RVEF 18%, extensive LGE throughout LV myocardium and patchy LGE in RV, elevated T1 and ECV. 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 Moderate right and small left pleural effusions.  He was markedly volume overloaded 1/23. Lasix  stopped, torsemide  40 started.   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  Echo 9/23 showed EF 30-35% with RV involvement.  Echo 11/24 EF 25-30% Moderate RV hk  (I thought 30-35%) Personally reviewed  Today he returns, with his wife, with a chief complaint of minimal shortness of breath. Has associated bilateral knee pain, rare chest pain. Chronic difficulty sleeping. Denies palpitations, dizziness, edema. Reports having a good appetite. Admits to  not being very active. Does have a sit down pedaler but it's currently in the closet.   Does not get flu vaccine.   FH: Mother had HF in her 7s. Brother passed from sudden death at age of 36.  ROS: All systems negative except what is listed in HPI, PMH and Problem List  Past Medical History:  Diagnosis Date   CHF (congestive heart failure) (HCC)    Chronic kidney disease    Hyperlipidemia    Hypertension    Peripheral edema    Current Outpatient Medications  Medication Sig Dispense Refill   allopurinol  200 MG TABS Take 200 mg by mouth daily. 90 tablet 3   aspirin  EC 81 MG tablet Take 81 mg by mouth daily. Swallow whole.     carvedilol  (COREG ) 3.125 MG tablet TAKE 1 TABLET BY MOUTH TWICE DAILY WITH A MEAL 180 tablet 0   dapagliflozin  propanediol (FARXIGA ) 10 MG TABS tablet Take 1 tablet (10 mg total) by mouth daily before breakfast. 90 tablet 2   potassium chloride  SA (KLOR-CON  M) 20 MEQ tablet Take 2 tablets (40 mEq total) by mouth daily. 180 tablet 0   PROAIR  HFA 108 (90 Base) MCG/ACT inhaler Inhale 1-2 puffs into the lungs every 6 (six) hours as needed for wheezing.     sacubitril -valsartan  (ENTRESTO ) 24-26 MG Take 1 tablet by mouth 2 (two) times daily. 180 tablet 3   spironolactone  (ALDACTONE ) 25 MG tablet Take 1/2 (one-half) tablet by mouth once daily 45 tablet 0   Tafamidis  (VYNDAMAX ) 61 MG CAPS Take 1 capsule by mouth daily. 30 capsule 11   torsemide  (DEMADEX ) 20 MG tablet  Take 2 tablets by mouth once daily 180 tablet 0   No current facility-administered medications for this visit.   No Known Allergies  Social History   Socioeconomic History   Marital status: Single    Spouse name: Not on file   Number of children: Not on file   Years of education: Not on file   Highest education level: Not on file  Occupational History   Not on file  Tobacco Use   Smoking status: Never   Smokeless tobacco: Never  Vaping Use   Vaping status: Never Used  Substance and Sexual  Activity   Alcohol use: Not on file   Drug use: Not on file   Sexual activity: Not on file  Other Topics Concern   Not on file  Social History Narrative   ** Merged History Encounter **       Social Drivers of Health   Financial Resource Strain: Not on file  Food Insecurity: Not on file  Transportation Needs: Not on file  Physical Activity: Not on file  Stress: Not on file  Social Connections: Not on file  Intimate Partner Violence: Not on file    No family history on file.  Vitals:   09/18/24 1404  BP: 102/78  Pulse: 80  SpO2: 99%  Weight: 250 lb (113.4 kg)   Wt Readings from Last 3 Encounters:  09/18/24 250 lb (113.4 kg)  01/17/24 251 lb 9.6 oz (114.1 kg)  08/09/23 260 lb 3.2 oz (118 kg)   Lab Results  Component Value Date   CREATININE 2.39 (H) 01/17/2024   CREATININE 1.91 (H) 08/22/2023   CREATININE 2.17 (H) 08/09/2023    PHYSICAL EXAM:  General: Elderly male in wheelchair. Well appearing.  Cor: No JVD. Regular rhythm, rate.  Lungs: clear Abdomen: soft, nontender, nondistended. Extremities: no edema. Bilateral knee braces on.  Neuro:. 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 Moderate right and small left pleural effusions. - PYP (2/23) markedly positive.  - Echo (08/06/22): EF 30-35% severe LVH moderate RV HK - Echo 11/24 EF 25-30% Moderate RV hk  (I thought 30-35%)  Personally reviewed  - NYHA III mostly limited by physical debility (WC bound) - Euvolemic - Continue carvedilol  3.125 mg bid. - Continue Farxiga  10 mg daily. No GU symptoms - Continue Entresto  24/26 mg bid, unable to uptitrate w/ BP - Continue spironolactone  12.5 mg daily. - Continue torsemide  40 mg daily/ 40meq potassium daily - BMET today as last results were hemolyzed - SPEP - polyclonal increase. No M-spike. Urine IFE negative. - PYP markedly positive. - Genetic testing +.Refused family screening - Continue tafamadis - Not candidate for ICD with degree of debility  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 - Myeloma panel negative. - Genetic testing +  Val142lle. Discussed Amvuttra today, however he declines. - PYP 2/23 markedly positive. - Continue tafamdis -  He has declined referral for genetic counseling. No change - Given stability of symptoms will not add Amvuttra at this time  3. CAD - Cath (11/22): CTO LAD with collaterals, moderate disease Lcx and RCA - Managed medically. - No s/s angina  - Continue aspirin . - Off statin with elevated LFTs.  4. CKD IIIb - Baseline SCr 1.4-1.6 - Continue Farxiga .  5. HTN - BP 102/78 - BMET 01/17/24 had hemolyzed results so no potassium level resulted. Creatinine was 2.39 - BMET today   6. HLD - Off statin per primary cardiologist given elevated LFTs.  7. Liver cirrhosis - Noted on US  abdomen. - Has stopped ETOH   Return in 4 months, sooner if needed. Will schedule f/u with MD after next visit.   I spent 30 minutes reviewing records, interviewing/ examing patient and managing plan/ orders.   Tyler DELENA Class, FNP  2:06 PM

## 2024-09-17 NOTE — Telephone Encounter (Signed)
 Called to confirm/remind patient of their appointment at the Advanced [facility] Failure Clinic on 09/18/24.   Appointment:   [] Confirmed  [] Left mess   [] No answer/No voice mail  [x] VM Full/unable to leave message  [] Phone not in service  Patient reminded to bring all medications and/or complete list.  Confirmed patient has transportation. Gave directions, instructed to utilize valet parking.

## 2024-09-18 ENCOUNTER — Encounter: Payer: Self-pay | Admitting: Family

## 2024-09-18 ENCOUNTER — Ambulatory Visit: Attending: Family | Admitting: Family

## 2024-09-18 VITALS — BP 102/78 | HR 80 | Wt 250.0 lb

## 2024-09-18 DIAGNOSIS — K7031 Alcoholic cirrhosis of liver with ascites: Secondary | ICD-10-CM | POA: Insufficient documentation

## 2024-09-18 DIAGNOSIS — N2889 Other specified disorders of kidney and ureter: Secondary | ICD-10-CM | POA: Diagnosis not present

## 2024-09-18 DIAGNOSIS — N1832 Chronic kidney disease, stage 3b: Secondary | ICD-10-CM | POA: Diagnosis not present

## 2024-09-18 DIAGNOSIS — I130 Hypertensive heart and chronic kidney disease with heart failure and stage 1 through stage 4 chronic kidney disease, or unspecified chronic kidney disease: Secondary | ICD-10-CM | POA: Diagnosis not present

## 2024-09-18 DIAGNOSIS — I10 Essential (primary) hypertension: Secondary | ICD-10-CM

## 2024-09-18 DIAGNOSIS — I429 Cardiomyopathy, unspecified: Secondary | ICD-10-CM | POA: Diagnosis not present

## 2024-09-18 DIAGNOSIS — I5022 Chronic systolic (congestive) heart failure: Secondary | ICD-10-CM | POA: Diagnosis not present

## 2024-09-18 DIAGNOSIS — E785 Hyperlipidemia, unspecified: Secondary | ICD-10-CM | POA: Diagnosis not present

## 2024-09-18 DIAGNOSIS — E8582 Wild-type transthyretin-related (ATTR) amyloidosis: Secondary | ICD-10-CM | POA: Diagnosis not present

## 2024-09-18 DIAGNOSIS — Z7984 Long term (current) use of oral hypoglycemic drugs: Secondary | ICD-10-CM | POA: Insufficient documentation

## 2024-09-18 DIAGNOSIS — I2510 Atherosclerotic heart disease of native coronary artery without angina pectoris: Secondary | ICD-10-CM | POA: Insufficient documentation

## 2024-09-18 DIAGNOSIS — F1091 Alcohol use, unspecified, in remission: Secondary | ICD-10-CM | POA: Insufficient documentation

## 2024-09-18 DIAGNOSIS — Z79899 Other long term (current) drug therapy: Secondary | ICD-10-CM | POA: Diagnosis not present

## 2024-09-18 DIAGNOSIS — K7460 Unspecified cirrhosis of liver: Secondary | ICD-10-CM

## 2024-09-18 DIAGNOSIS — Z7982 Long term (current) use of aspirin: Secondary | ICD-10-CM | POA: Insufficient documentation

## 2024-09-18 DIAGNOSIS — R911 Solitary pulmonary nodule: Secondary | ICD-10-CM | POA: Insufficient documentation

## 2024-09-18 DIAGNOSIS — E854 Organ-limited amyloidosis: Secondary | ICD-10-CM

## 2024-09-18 DIAGNOSIS — R188 Other ascites: Secondary | ICD-10-CM

## 2024-09-18 DIAGNOSIS — I43 Cardiomyopathy in diseases classified elsewhere: Secondary | ICD-10-CM

## 2024-09-18 NOTE — Patient Instructions (Addendum)
 It was good to see you today!   Labs done today, your results will be available in MyChart, we will contact you for abnormal readings.   Do the following things EVERYDAY: Weigh yourself in the morning before breakfast. Write it down and keep it in a log. Take your medicines as prescribed Eat low salt foods--Limit salt (sodium) to 2000 mg per day.  Stay as active as you can everyday Limit all fluids for the day to less than 2 liters    Follow Up: in 4 months with Ellouise Class: January 16, 2025 at 1:30 PM

## 2024-09-19 ENCOUNTER — Other Ambulatory Visit: Payer: Self-pay | Admitting: Family

## 2024-09-19 ENCOUNTER — Ambulatory Visit: Payer: Self-pay | Admitting: Family

## 2024-09-19 DIAGNOSIS — I5022 Chronic systolic (congestive) heart failure: Secondary | ICD-10-CM

## 2024-09-19 LAB — BASIC METABOLIC PANEL WITH GFR
BUN/Creatinine Ratio: 26 — ABNORMAL HIGH (ref 10–24)
BUN: 51 mg/dL — ABNORMAL HIGH (ref 8–27)
CO2: 18 mmol/L — ABNORMAL LOW (ref 20–29)
Calcium: 9.5 mg/dL (ref 8.6–10.2)
Chloride: 99 mmol/L (ref 96–106)
Creatinine, Ser: 1.97 mg/dL — ABNORMAL HIGH (ref 0.76–1.27)
Glucose: 97 mg/dL (ref 70–99)
Potassium: 5.3 mmol/L — ABNORMAL HIGH (ref 3.5–5.2)
Sodium: 133 mmol/L — ABNORMAL LOW (ref 134–144)
eGFR: 36 mL/min/1.73 — ABNORMAL LOW (ref >59)

## 2024-09-19 NOTE — Progress Notes (Signed)
 BMET order placed. Potassium stopped

## 2024-09-27 ENCOUNTER — Other Ambulatory Visit
Admission: RE | Admit: 2024-09-27 | Discharge: 2024-09-27 | Disposition: A | Discharge status: Home / Self Care | Attending: Family | Admitting: Family

## 2024-09-27 ENCOUNTER — Ambulatory Visit: Payer: Self-pay | Admitting: Family

## 2024-09-27 DIAGNOSIS — I5022 Chronic systolic (congestive) heart failure: Secondary | ICD-10-CM | POA: Diagnosis present

## 2024-09-27 LAB — BASIC METABOLIC PANEL WITH GFR
Anion gap: 14 (ref 5–15)
BUN: 46 mg/dL — ABNORMAL HIGH (ref 8–23)
CO2: 21 mmol/L — ABNORMAL LOW (ref 22–32)
Calcium: 9.3 mg/dL (ref 8.9–10.3)
Chloride: 101 mmol/L (ref 98–111)
Creatinine, Ser: 1.8 mg/dL — ABNORMAL HIGH (ref 0.61–1.24)
GFR, Estimated: 40 mL/min — ABNORMAL LOW (ref >60)
Glucose, Bld: 110 mg/dL — ABNORMAL HIGH (ref 70–99)
Potassium: 4.2 mmol/L (ref 3.5–5.1)
Sodium: 136 mmol/L (ref 135–145)

## 2024-10-10 ENCOUNTER — Other Ambulatory Visit: Payer: Self-pay

## 2024-10-10 ENCOUNTER — Other Ambulatory Visit: Payer: Self-pay | Admitting: Pharmacy Technician

## 2024-10-10 NOTE — Progress Notes (Signed)
 Specialty Pharmacy Refill Coordination Note  Tyler Ochoa is a 73 y.o. male contacted today regarding refills of specialty medication(s)  Tafamidis  (Vyndamax )   Patient requested (Patient-Rptd) Delivery   Delivery date: 10/18/2024 Verified address: (Patient-Rptd) [address] [place], [address]   Medication will be filled on: 10/17/2024

## 2024-10-17 ENCOUNTER — Other Ambulatory Visit: Payer: Self-pay

## 2024-10-18 ENCOUNTER — Observation Stay: Admission: EM | Admit: 2024-10-18 | Discharge: 2024-10-19 | Disposition: A | Discharge status: Home / Self Care

## 2024-10-18 ENCOUNTER — Observation Stay

## 2024-10-18 ENCOUNTER — Emergency Department

## 2024-10-18 ENCOUNTER — Other Ambulatory Visit: Payer: Self-pay

## 2024-10-18 DIAGNOSIS — E785 Hyperlipidemia, unspecified: Secondary | ICD-10-CM | POA: Diagnosis present

## 2024-10-18 DIAGNOSIS — I639 Cerebral infarction, unspecified: Secondary | ICD-10-CM | POA: Diagnosis present

## 2024-10-18 DIAGNOSIS — I6389 Other cerebral infarction: Principal | ICD-10-CM | POA: Insufficient documentation

## 2024-10-18 DIAGNOSIS — I2510 Atherosclerotic heart disease of native coronary artery without angina pectoris: Secondary | ICD-10-CM | POA: Diagnosis present

## 2024-10-18 DIAGNOSIS — N179 Acute kidney failure, unspecified: Secondary | ICD-10-CM

## 2024-10-18 DIAGNOSIS — I5020 Unspecified systolic (congestive) heart failure: Secondary | ICD-10-CM | POA: Diagnosis present

## 2024-10-18 DIAGNOSIS — Z79899 Other long term (current) drug therapy: Secondary | ICD-10-CM | POA: Insufficient documentation

## 2024-10-18 DIAGNOSIS — I609 Nontraumatic subarachnoid hemorrhage, unspecified: Secondary | ICD-10-CM

## 2024-10-18 DIAGNOSIS — N189 Chronic kidney disease, unspecified: Secondary | ICD-10-CM | POA: Insufficient documentation

## 2024-10-18 DIAGNOSIS — Z7982 Long term (current) use of aspirin: Secondary | ICD-10-CM | POA: Insufficient documentation

## 2024-10-18 DIAGNOSIS — I130 Hypertensive heart and chronic kidney disease with heart failure and stage 1 through stage 4 chronic kidney disease, or unspecified chronic kidney disease: Secondary | ICD-10-CM | POA: Insufficient documentation

## 2024-10-18 DIAGNOSIS — I10 Essential (primary) hypertension: Secondary | ICD-10-CM | POA: Diagnosis present

## 2024-10-18 DIAGNOSIS — Z8679 Personal history of other diseases of the circulatory system: Secondary | ICD-10-CM | POA: Insufficient documentation

## 2024-10-18 LAB — COMPREHENSIVE METABOLIC PANEL WITH GFR
ALT: 6 U/L (ref 0–44)
AST: 19 U/L (ref 15–41)
Albumin: 3.5 g/dL (ref 3.5–5.0)
Alkaline Phosphatase: 159 U/L — ABNORMAL HIGH (ref 38–126)
Anion gap: 16 — ABNORMAL HIGH (ref 5–15)
BUN: 58 mg/dL — ABNORMAL HIGH (ref 8–23)
CO2: 23 mmol/L (ref 22–32)
Calcium: 9.7 mg/dL (ref 8.9–10.3)
Chloride: 92 mmol/L — ABNORMAL LOW (ref 98–111)
Creatinine, Ser: 2.26 mg/dL — ABNORMAL HIGH (ref 0.61–1.24)
GFR, Estimated: 30 mL/min — ABNORMAL LOW (ref >60)
Glucose, Bld: 107 mg/dL — ABNORMAL HIGH (ref 70–99)
Potassium: 4.8 mmol/L (ref 3.5–5.1)
Sodium: 132 mmol/L — ABNORMAL LOW (ref 135–145)
Total Bilirubin: 0.5 mg/dL (ref 0.0–1.2)
Total Protein: 8.1 g/dL (ref 6.5–8.1)

## 2024-10-18 LAB — DIFFERENTIAL
Abs Immature Granulocytes: 0.05 K/uL (ref 0.00–0.07)
Basophils Absolute: 0.1 K/uL (ref 0.0–0.1)
Basophils Relative: 2 %
Eosinophils Absolute: 0.3 K/uL (ref 0.0–0.5)
Eosinophils Relative: 4 %
Immature Granulocytes: 1 %
Lymphocytes Relative: 19 %
Lymphs Abs: 1.3 K/uL (ref 0.7–4.0)
Monocytes Absolute: 0.6 K/uL (ref 0.1–1.0)
Monocytes Relative: 8 %
Neutro Abs: 4.6 K/uL (ref 1.7–7.7)
Neutrophils Relative %: 66 %

## 2024-10-18 LAB — CBC
HCT: 44.2 % (ref 39.0–52.0)
Hemoglobin: 14.5 g/dL (ref 13.0–17.0)
MCH: 28.9 pg (ref 26.0–34.0)
MCHC: 32.8 g/dL (ref 30.0–36.0)
MCV: 88.2 fL (ref 80.0–100.0)
Platelets: 466 K/uL — ABNORMAL HIGH (ref 150–400)
RBC: 5.01 MIL/uL (ref 4.22–5.81)
RDW: 14.2 % (ref 11.5–15.5)
WBC: 7 K/uL (ref 4.0–10.5)
nRBC: 0 % (ref 0.0–0.2)

## 2024-10-18 LAB — PROTIME-INR
INR: 1.4 — ABNORMAL HIGH (ref 0.8–1.2)
Prothrombin Time: 18.2 s — ABNORMAL HIGH (ref 11.4–15.2)

## 2024-10-18 LAB — ETHANOL: Alcohol, Ethyl (B): <15 mg/dL (ref <15)

## 2024-10-18 LAB — CBG MONITORING, ED: Glucose-Capillary: 109 mg/dL — ABNORMAL HIGH (ref 70–99)

## 2024-10-18 LAB — APTT: aPTT: 47 s — ABNORMAL HIGH (ref 24–36)

## 2024-10-18 MED ORDER — LORAZEPAM 2 MG/ML IJ SOLN
1.0000 mg | Freq: Once | INTRAMUSCULAR | Status: AC
  Administered 2024-10-18: 1 mg via INTRAVENOUS
  Filled 2024-10-18: qty 1

## 2024-10-18 MED ORDER — ACETAMINOPHEN 160 MG/5ML PO SOLN: 650.0000 mg | ORAL | Status: DC | PRN

## 2024-10-18 MED ORDER — ACETAMINOPHEN 325 MG PO TABS
650.0000 mg | ORAL_TABLET | ORAL | Status: DC | PRN
  Administered 2024-10-19: 650 mg via ORAL
  Filled 2024-10-18: qty 2

## 2024-10-18 MED ORDER — ACETAMINOPHEN 325 MG RE SUPP: 650.0000 mg | RECTAL | Status: DC | PRN

## 2024-10-18 MED ORDER — TORSEMIDE 20 MG PO TABS
40.0000 mg | ORAL_TABLET | Freq: Every day | ORAL | Status: DC
  Administered 2024-10-19: 40 mg via ORAL
  Filled 2024-10-18: qty 2

## 2024-10-18 MED ORDER — STROKE: EARLY STAGES OF RECOVERY BOOK: Freq: Once | Status: DC

## 2024-10-18 MED ORDER — CARVEDILOL 6.25 MG PO TABS
3.1250 mg | ORAL_TABLET | Freq: Two times a day (BID) | ORAL | Status: DC
  Administered 2024-10-18 – 2024-10-19 (×2): 3.125 mg via ORAL
  Filled 2024-10-18 (×2): qty 1

## 2024-10-18 MED ORDER — SODIUM CHLORIDE 0.9 % IV BOLUS
500.0000 mL | Freq: Once | INTRAVENOUS | Status: AC
  Administered 2024-10-18: 500 mL via INTRAVENOUS

## 2024-10-18 MED ORDER — SODIUM CHLORIDE 0.9 % IV SOLN: INTRAVENOUS | Status: DC

## 2024-10-18 MED ORDER — SENNOSIDES-DOCUSATE SODIUM 8.6-50 MG PO TABS: 1.0000 | ORAL_TABLET | Freq: Every evening | ORAL | Status: DC | PRN

## 2024-10-18 MED ORDER — ALLOPURINOL 100 MG PO TABS
200.0000 mg | ORAL_TABLET | Freq: Every day | ORAL | Status: DC
  Administered 2024-10-19: 200 mg via ORAL
  Filled 2024-10-18: qty 2

## 2024-10-18 MED ORDER — SPIRONOLACTONE 12.5 MG HALF TABLET
12.5000 mg | ORAL_TABLET | Freq: Every day | ORAL | Status: DC
  Administered 2024-10-19: 12.5 mg via ORAL
  Filled 2024-10-18: qty 1

## 2024-10-18 MED FILL — Sacubitril-Valsartan Tab 24-26 MG: 1.0000 | ORAL | Qty: 1 | Status: AC

## 2024-10-18 NOTE — H&P (Signed)
 History and Physical    Tyler Ochoa FMW:[id number] DOB: 08/17/1951 DOA: 10/18/2024  DOS: the patient was seen and examined on 10/18/2024  PCP: [facility]   Patient coming from: Home  I have personally briefly reviewed patient's old medical records in [facility] Link  Chief Complaint: Right upper extremity weakness and slurred speech resolved within 24 hours  HPI: Tyler Ochoa is a pleasant 73 y.o. male with medical history significant for CHF, cardiac amyloidosis, CKD, HLD, HTN, arthritis who presented to ED at [facility] complaining of acute onset of right upper extremity numbness and weakness started 9 PM 10/16/2024 on Tuesday, 2 days Thursday 2 days ago.  He stated that his symptoms resolved by the morning but he was concerned and decided to come to emergency room today.  He feels like he is at his baseline.  He takes aspirin  daily at home.  Patient denies any trauma, change in mental status, headache, nausea, vomiting, gait problem.  However he had some slurred speech for transient period of time.  He does not remember having similar symptoms in the past.  ED Course: Upon arrival to the ED, patient is found to be tachycardic in 118 with some flutter, EKG showed a flutter with variable block at 97 bpm, no ST elevation, CT scan of the brain showed right-sided CVA and possible small surrounding subarachnoid/petechial hemorrhages.  Neurology was consulted who advised to do the stroke workup but no antiplatelet agents.  Hospitalist service was consulted for evaluation for admission for stroke workup  Review of Systems:  ROS  All other systems negative except as noted in the HPI.  Past Medical History:  Diagnosis Date   CHF (congestive heart failure) (HCC)    Chronic kidney disease    Hyperlipidemia    Hypertension    Peripheral edema     Past Surgical History:  Procedure Laterality Date   RIGHT/LEFT HEART CATH AND CORONARY ANGIOGRAPHY N/A 09/08/2021   Procedure:  RIGHT/LEFT HEART CATH AND CORONARY ANGIOGRAPHY;  Surgeon: Mady Bruckner, MD;  Location: [facility] INVASIVE CV LAB;  Service: Cardiovascular;  Laterality: N/A;     reports that he has never smoked. He has never used smokeless tobacco. No history on file for alcohol use and drug use.  Allergies[1]  History reviewed. No pertinent family history.  Prior to Admission medications  Medication Sig Start Date End Date Taking? Authorizing Provider  allopurinol  200 MG TABS Take 200 mg by mouth daily. 01/31/24  Yes Bensimhon, Toribio SAUNDERS, MD  aspirin  EC 81 MG tablet Take 81 mg by mouth daily. Swallow whole.   Yes [provider]  carvedilol  (COREG ) 3.125 MG tablet TAKE 1 TABLET BY MOUTH TWICE DAILY WITH A MEAL 08/31/24  Yes [place], [place], FNP  dapagliflozin  propanediol (FARXIGA ) 10 MG TABS tablet Take 1 tablet (10 mg total) by mouth daily before breakfast. 08/24/24  Yes Bensimhon, Toribio SAUNDERS, MD  sacubitril -valsartan  (ENTRESTO ) 24-26 MG Take 1 tablet by mouth 2 (two) times daily. 12/21/23  Yes Bensimhon, Toribio SAUNDERS, MD  spironolactone  (ALDACTONE ) 25 MG tablet Take 1/2 (one-half) tablet by mouth once daily 09/10/24  Yes [place], [place], FNP  Tafamidis  (VYNDAMAX ) 61 MG CAPS Take 1 capsule by mouth daily. 12/21/23  Yes Bensimhon, Toribio SAUNDERS, MD  torsemide  (DEMADEX ) 20 MG tablet Take 2 tablets by mouth once daily 08/23/24  Yes [place], [place], [place]  PROAIR  HFA 108 (90 Base) MCG/ACT inhaler Inhale 1-2 puffs into the lungs every 6 (six) hours as needed  for wheezing. Patient not taking: Reported on 10/18/2024    [provider]    Physical Exam: Vitals:   10/18/24 1241 10/18/24 1242 10/18/24 1648  BP: 105/73  109/76  Pulse: (!) 118  84  Resp: 18  (!) 23  Temp: 97.6 F (36.4 C)  (!) 97.3 F (36.3 C)  TempSrc: Oral  Oral  SpO2: 98%  99%  Weight:  112 kg   Height:  6' 3 (1.905 m)     Physical Exam   Constitutional: Alert, awake, calm, comfortable HEENT: Neck supple Respiratory:  Clear to auscultation B/L, no wheezing, no rales.  Cardiovascular: Regular rate and rhythm, no murmurs / rubs / gallops. No extremity edema. 2+ pedal pulses. No carotid bruits.  Abdomen: Soft, no tenderness, Bowel sounds positive.  Musculoskeletal: no clubbing / cyanosis. Good ROM, no contractures. Normal muscle tone.  Skin: no rashes, lesions, ulcers. Neurologic: CN 2-12 grossly intact. Sensation intact, No focal deficit identified Psychiatric: Alert and oriented x 3. Normal mood.    Labs on Admission: I have personally reviewed following labs and imaging studies  CBC: Recent Labs  Lab 10/18/24 1248  WBC 7.0  NEUTROABS 4.6  HGB 14.5  HCT 44.2  MCV 88.2  PLT 466*   Basic Metabolic Panel: Recent Labs  Lab 10/18/24 1248  NA 132*  K 4.8  CL 92*  CO2 23  GLUCOSE 107*  BUN 58*  CREATININE 2.26*  CALCIUM  9.7   GFR: Estimated Creatinine Clearance: 40.5 mL/min (A) (by C-G formula based on SCr of 2.26 mg/dL (H)). Liver Function Tests: Recent Labs  Lab 10/18/24 1248  AST 19  ALT 6  ALKPHOS 159*  BILITOT 0.5  PROT 8.1  ALBUMIN 3.5   No results for input(s): LIPASE, AMYLASE in the last 168 hours. No results for input(s): AMMONIA in the last 168 hours. Coagulation Profile: Recent Labs  Lab 10/18/24 1248  INR 1.4*   Cardiac Enzymes: No results for input(s): CKTOTAL, CKMB, CKMBINDEX, TROPONINI, TROPONINIHS in the last 168 hours. BNP (last 3 results) No results for input(s): BNP in the last 8760 hours. HbA1C: No results for input(s): HGBA1C in the last 72 hours. CBG: Recent Labs  Lab 10/18/24 1247  GLUCAP 109*   Lipid Profile: No results for input(s): CHOL, HDL, LDLCALC, TRIG, CHOLHDL, LDLDIRECT in the last 72 hours. Thyroid  Function Tests: No results for input(s): TSH, T4TOTAL, FREET4, T3FREE, THYROIDAB in the last 72 hours. Anemia Panel: No results for input(s): VITAMINB12, FOLATE, FERRITIN, TIBC, IRON,  RETICCTPCT in the last 72 hours. Urine analysis:    Component Value Date/Time   COLORURINE YELLOW (A) 03/07/2021 1705   APPEARANCEUR CLEAR (A) 03/07/2021 1705   LABSPEC 1.010 03/07/2021 1705   PHURINE 5.0 03/07/2021 1705   GLUCOSEU >=500 (A) 03/07/2021 1705   HGBUR SMALL (A) 03/07/2021 1705   BILIRUBINUR NEGATIVE 03/07/2021 1705   KETONESUR NEGATIVE 03/07/2021 1705   PROTEINUR 30 (A) 03/07/2021 1705   NITRITE NEGATIVE 03/07/2021 1705   LEUKOCYTESUR NEGATIVE 03/07/2021 1705    Radiological Exams on Admission: I have personally reviewed images CT HEAD WO CONTRAST Result Date: 10/18/2024 CLINICAL DATA:  Follow-up stroke. EXAM: CT HEAD WITHOUT CONTRAST TECHNIQUE: Contiguous axial images were obtained from the base of the skull through the vertex without intravenous contrast. RADIATION DOSE REDUCTION: This exam was performed according to the departmental dose-optimization program which includes automated exposure control, adjustment of the mA and/or kV according to patient size and/or use of iterative reconstruction technique. COMPARISON:  None Available.  FINDINGS: Brain: Ventricles and cisterns are normal. There is mild low-attenuation over the gray-white matter over the right posterior temporal/parietal region with mild effacement of the sulci. This likely represents patient's known recent stroke. Possible minimal acute subarachnoid hemorrhage/petechial hemorrhage over the sulci in this region. 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 Remainder of the exam is unremarkable. Vascular: No hyperdense vessel or unexpected calcification. Skull: Normal. Negative for fracture or focal lesion. Sinuses/Orbits: Orbits are normal symmetric. Paranasal sinuses are well developed and demonstrate minimal mucosal membrane thickening over the maxillary and ethmoid sinuses. There is moderate opacification with mucoperiosteal thickening involving the sphenoid sinus. Other: None. IMPRESSION: 1. Mild low-attenuation  over the gray-white matter over the right posterior temporal/parietal region with mild effacement of the sulci. This likely represents patient's known recent infarct. Possible minimal acute subarachnoid hemorrhage/petechial hemorrhage over the sulci in this region. 2. 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 Critical Value/emergent results were called by telephone at the time of interpretation on 10/18/2024 at 2:58 pm to provider Ozell Klein, who verbally acknowledged these results. Electronically Signed   By: Toribio Agreste M.D.   On: 10/18/2024 14:59    EKG: My personal interpretation of EKG shows: Flutter with variable block, at 97 bpm, no ST elevation.    Assessment/Plan Principal Problem:   Acute stroke due to ischemia [facility]) Active Problems:   Essential hypertension   Hyperlipidemia   HFrEF (heart failure with reduced ejection fraction) (HCC)   Coronary artery disease involving native coronary artery of native heart without angina pectoris    Assessment and Plan: 73 year old male double/PMH of HFrEF last EF was 25 to 30% in November 2024, cardiac, lipidosis, HTN, HLD, CAD s/p MI, liver cirrhosis who came into ED complaining of tingling and numbness of right arm and face and slurred speech 2 days ago which resolved within 24 hours.  1.  Acute ischemic stroke with some concerning hemorrhage/petechiae - He will be placed in observation for comprehensive stroke workup - He does not have a neurological deficit at this point - Will hold off anticoagulation and antiplatelet agent - He will get MRI of the brain and MR angiogram of the brain - Neurochecks per stroke protocol PT/OT/ST/echocardiogram/lipid panel - Neurology consult appreciated  2.  History of HFrEF with last EF 25 to 30% - Will continue his home medications torsemide , Entresto  and spironolactone  - He will get daily weight, input output charting  3.  Concerning atrial flutter on EKG -  Patient has a known cardiac history including CAD, CHF, amyloidosis - Will get echocardiogram - Continue Coreg  from home - His heart rate has been fairly stable - Will get cardiology for evaluation - Monitoring telemetry  4.  History of CAD - Patient is on aspirin , Coreg , which will be continued  5.  History of gout - Patient is on allopurinol  which will be continued  6.  CKD - His creatinine is around 2 most of the time. - This time it is slightly high at 2.6. - Will continue to monitor kidney function      DVT prophylaxis: SCDs Code Status: Full Code Family Communication: Wife was at bedside Disposition Plan: Home versus rehab Consults called: Neurology and cardiology Admission status: Observation, progressive care unit   Nena Rebel, MD [facility] 10/18/2024, 5:34 PM        [1] No Known Allergies

## 2024-10-18 NOTE — ED Triage Notes (Signed)
 Pt arrives via POV with c/o a possible stroke. Pt LKW was 2 days ago. Pt's symptoms were right arm numbness, slurring words, drooling which have all resolved. The symptoms lasted 24 hours, but pt refused to go to the hospital. Pt is A&Ox4 during triage.

## 2024-10-18 NOTE — Consult Note (Signed)
 Reason for Consult:Acute infarct Requesting Physician: Clarine  CC: RUE weakness and slurred speech  I have been asked by Dr. Clarine to see this patient in consultation for acute infarct.  HPI: Tyler Ochoa is an 73 y.o. male with a history of CHF, CKD, HLD, HTN and arthritis who reports that at about 2100 on 12/9 patient experienced acute onset of RUE numbness and weakness.  Also had slurred speech.  By morning symptoms had improved significantly.  Feels he is back to baseline today but felt her should come in to be evaluated.   Patient on ASA daily at home  LKW 12/9 @ 2100 TNK candidate: No; outside time window Thrombectomy candidate: No; resolution of symptoms, outside time window  Past Medical History:  Diagnosis Date   CHF (congestive heart failure) (HCC)    Chronic kidney disease    Hyperlipidemia    Hypertension    Peripheral edema     Past Surgical History:  Procedure Laterality Date   RIGHT/LEFT HEART CATH AND CORONARY ANGIOGRAPHY N/A 09/08/2021   Procedure: RIGHT/LEFT HEART CATH AND CORONARY ANGIOGRAPHY;  Surgeon: Mady Bruckner, MD;  Location: [facility] IN[facility]IVE [facility];  Service: Cardiovascular;  Laterality: N/A;    History reviewed. No pertinent family history.  Social History:  reports that he has never smoked. He has never used smokeless tobacco. No history on file for alcohol use and drug use.  Allergies[1]  Medications: I have reviewed the patient's current medications. Prior to Admission:  Prior to Admission medications  Medication Sig Start Date End Date Taking? Authorizing Provider  allopurinol  200 MG TABS Take 200 mg by mouth daily. 01/31/24  Yes Bensimhon, Toribio SAUNDERS, MD  aspirin  EC 81 MG tablet Take 81 mg by mouth daily. Swallow whole.   Yes [provider]  carvedilol  (COREG ) 3.125 MG tablet TAKE 1 TABLET BY MOUTH TWICE DAILY WITH A MEAL 08/31/24  Yes [place], [place], FNP  dapagliflozin  propanediol (FARXIGA ) 10 MG TABS tablet Take 1 tablet (10 mg  total) by mouth daily before breakfast. 08/24/24  Yes Bensimhon, Toribio SAUNDERS, MD  sacubitril -valsartan  (ENTRESTO ) 24-26 MG Take 1 tablet by mouth 2 (two) times daily. 12/21/23  Yes Bensimhon, Toribio SAUNDERS, MD  spironolactone  (ALDACTONE ) 25 MG tablet Take 1/2 (one-half) tablet by mouth once daily 09/10/24  Yes [place], [place], FNP  Tafamidis  (VYNDAMAX ) 61 MG CAPS Take 1 capsule by mouth daily. 12/21/23  Yes Bensimhon, Toribio SAUNDERS, MD  torsemide  (DEMADEX ) 20 MG tablet Take 2 tablets by mouth once daily 08/23/24  Yes [place], [place], FNP  PROAIR  HFA 108 (90 Base) MCG/ACT inhaler Inhale 1-2 puffs into the lungs every 6 (six) hours as needed for wheezing. Patient not taking: Reported on 10/18/2024    [provider]     ROS: History obtained from the patient  General ROS: negative for - chills, fatigue, fever, night sweats, weight gain or weight loss Psychological ROS: negative for - behavioral disorder, hallucinations, memory difficulties, mood swings or suicidal ideation Ophthalmic ROS: negative for - blurry vision, double vision, eye pain or loss of vision ENT ROS: negative for - epistaxis, nasal discharge, oral lesions, sore throat, tinnitus or vertigo Allergy and Immunology ROS: negative for - hives or itchy/watery eyes Hematological and Lymphatic ROS: negative for - bleeding problems, bruising or swollen lymph nodes Endocrine ROS: negative for - galactorrhea, hair pattern changes, polydipsia/polyuria or temperature intolerance Respiratory ROS: negative for - cough, hemoptysis, shortness of breath or wheezing Cardiovascular ROS: negative for - chest pain,  dyspnea on exertion, edema or irregular heartbeat Gastrointestinal ROS: negative for - abdominal pain, diarrhea, hematemesis, nausea/vomiting or stool incontinence Genito-Urinary ROS: negative for - dysuria, hematuria, incontinence or urinary frequency/urgency Musculoskeletal ROS: bilateral knee pain Neurological ROS: as noted in  HPI Dermatological ROS: negative for rash and skin lesion changes   Physical Examination: Blood pressure 105/73, pulse (!) 118, temperature 97.6 F (36.4 C), temperature source Oral, resp. rate 18, height 6' 3 (1.905 m), weight 112 kg, SpO2 98%.  HEENT-  Normocephalic, no lesions, without obvious abnormality.  Normal external eye and conjunctiva.  Normal external ears. Normal external nose, mucus membranes and septum. Cardiovascular- Single S1, S2 Lungs- CTA Abdomen- soft, non-tender; bowel sounds normal; no masses,  no organomegaly Extremities- 1+ edema bilaterally in the LEs Musculoskeletal-bilateral knee pain, unable to lift RLE secondary to pain Skin-warm and dry, no hyperpigmentation, vitiligo, or suspicious lesions  Neurological Examination   Mental Status: Alert, oriented, thought content appropriate.  Speech fluent without evidence of aphasia.  Able to follow 3 step commands without difficulty. Cranial Nerves: II: Discs flat bilaterally; Visual fields grossly normal, pupils equal, round, reactive to light and accommodation III,IV, VI: ptosis not present, extra-ocular motions intact bilaterally V,VII: smile symmetric, facial light touch sensation normal bilaterally VIII: hearing normal bilaterally XI: bilateral shoulder shrug XII: midline tongue extension Motor: Right : Upper extremity   mild pronator drift      Left:     Upper extremity   5/5  Lower extremity   unable to lift off bed      Lower extremity   5-/5  Sensory: Pinprick and light touch intact throughout, bilaterally Deep Tendon Reflexes: Symmetric throughout Plantars: Right: mute   Left: mute Cerebellar: normal finger-to-nose testing bilaterally Gait: Patient nonambulatory    Laboratory Studies:   Basic Metabolic Panel: Recent Labs  Lab 10/18/24 1248  NA 132*  K 4.8  CL 92*  CO2 23  GLUCOSE 107*  BUN 58*  CREATININE 2.26*  CALCIUM  9.7    Liver Function Tests: Recent Labs  Lab 10/18/24 1248   AST 19  ALT 6  ALKPHOS 159*  BILITOT 0.5  PROT 8.1  ALBUMIN 3.5   No results for input(s): LIPASE, AMYLASE in the last 168 hours. No results for input(s): AMMONIA in the last 168 hours.  CBC: Recent Labs  Lab 10/18/24 1248  WBC 7.0  NEUTROABS 4.6  HGB 14.5  HCT 44.2  MCV 88.2  PLT 466*    Cardiac Enzymes: No results for input(s): CKTOTAL, CKMB, CKMBINDEX, TROPONINI in the last 168 hours.  BNP: Invalid input(s): POCBNP  CBG: Recent Labs  Lab 10/18/24 1247  GLUCAP 109*    Microbiology: Results for orders placed or performed during the hospital encounter of 08/28/21  Resp Panel by RT-PCR (Flu A&B, Covid) Nasopharyngeal Swab     Status: None   Collection Time: 08/28/21  9:43 PM   Specimen: Nasopharyngeal Swab; Nasopharyngeal(NP) swabs in vial transport medium  Result Value Ref Range Status   SARS Coronavirus 2 by RT PCR NEGATIVE NEGATIVE Final    Comment: (NOTE) SARS-CoV-2 target nucleic acids are NOT DETECTED.  The SARS-CoV-2 RNA is generally detectable in upper respiratory specimens during the acute phase of infection. The lowest concentration of SARS-CoV-2 viral copies this assay can detect is 138 copies/mL. A negative result does not preclude SARS-Cov-2 infection and should not be used as the sole basis for treatment or other patient management decisions. A negative result may occur with  improper specimen  collection/handling, submission of specimen other than nasopharyngeal swab, presence of viral mutation(s) within the areas targeted by this assay, and inadequate number of viral copies(<138 copies/mL). A negative result must be combined with clinical observations, patient history, and epidemiological information. The expected result is Negative.  Fact Sheet for Patients:  bloggercourse.com  Fact Sheet for Healthcare Providers:  seriousbroker.it  This test is no t yet approved or  cleared by the [place]  FDA and  has been authorized for detection and/or diagnosis of SARS-CoV-2 by FDA under an Emergency Use Authorization (EUA). This EUA will remain  in effect (meaning this test can be used) for the duration of the COVID-19 declaration under Section 564(b)(1) of the Act, 21 U.S.C.section 360bbb-3(b)(1), unless the authorization is terminated  or revoked sooner.       Influenza A by PCR NEGATIVE NEGATIVE Final   Influenza B by PCR NEGATIVE NEGATIVE Final    Comment: (NOTE) The Xpert Xpress SARS-CoV-2/FLU/RSV plus assay is intended as an aid in the diagnosis of influenza from Nasopharyngeal swab specimens and should not be used as a sole basis for treatment. Nasal washings and aspirates are unacceptable for Xpert Xpress SARS-CoV-2/FLU/RSV testing.  Fact Sheet for Patients: bloggercourse.com  Fact Sheet for Healthcare Providers: seriousbroker.it  This test is not yet approved or cleared by the [place]  FDA and has been authorized for detection and/or diagnosis of SARS-CoV-2 by FDA under an Emergency Use Authorization (EUA). This EUA will remain in effect (meaning this test can be used) for the duration of the COVID-19 declaration under Section 564(b)(1) of the Act, 21 U.S.C. section 360bbb-3(b)(1), unless the authorization is terminated or revoked.  Performed at [facility], [address] [place], [place] [address]   SARS CORONAVIRUS 2 (TAT 6-24 HRS) Nasopharyngeal Nasopharyngeal Swab     Status: None   Collection Time: 09/06/21 12:00 PM   Specimen: Nasopharyngeal Swab  Result Value Ref Range Status   SARS Coronavirus 2 NEGATIVE NEGATIVE Final    Comment: (NOTE) SARS-CoV-2 target nucleic acids are NOT DETECTED.  The SARS-CoV-2 RNA is generally detectable in upper and lower respiratory specimens during the acute phase of infection. Negative results do not preclude SARS-CoV-2  infection, do not rule out co-infections with other pathogens, and should not be used as the sole basis for treatment or other patient management decisions. Negative results must be combined with clinical observations, patient history, and epidemiological information. The expected result is Negative.  Fact Sheet for Patients: hairslick.no  Fact Sheet for Healthcare Providers: quierodirigir.com  This test is not yet approved or cleared by the [place]  FDA and  has been authorized for detection and/or diagnosis of SARS-CoV-2 by FDA under an Emergency Use Authorization (EUA). This EUA will remain  in effect (meaning this test can be used) for the duration of the COVID-19 declaration under Se ction 564(b)(1) of the Act, 21 U.S.C. section 360bbb-3(b)(1), unless the authorization is terminated or revoked sooner.  Performed at [facility], [address], [place], [place] [address]   Resp Panel by RT-PCR (Flu A&B, Covid) Nasopharyngeal Swab     Status: None   Collection Time: 09/09/21 12:40 PM   Specimen: Nasopharyngeal Swab; Nasopharyngeal(NP) swabs in vial transport medium  Result Value Ref Range Status   SARS Coronavirus 2 by RT PCR NEGATIVE NEGATIVE Final    Comment: (NOTE) SARS-CoV-2 target nucleic acids are NOT DETECTED.  The SARS-CoV-2 RNA is generally detectable in upper respiratory specimens during the acute phase of infection.  The lowest concentration of SARS-CoV-2 viral copies this assay can detect is 138 copies/mL. A negative result does not preclude SARS-Cov-2 infection and should not be used as the sole basis for treatment or other patient management decisions. A negative result may occur with  improper specimen collection/handling, submission of specimen other than nasopharyngeal swab, presence of viral mutation(s) within the areas targeted by this assay, and inadequate number of viral copies(<138  copies/mL). A negative result must be combined with clinical observations, patient history, and epidemiological information. The expected result is Negative.  Fact Sheet for Patients:  bloggercourse.com  Fact Sheet for Healthcare Providers:  seriousbroker.it  This test is no t yet approved or cleared by the [place]  FDA and  has been authorized for detection and/or diagnosis of SARS-CoV-2 by FDA under an Emergency Use Authorization (EUA). This EUA will remain  in effect (meaning this test can be used) for the duration of the COVID-19 declaration under Section 564(b)(1) of the Act, 21 U.S.C.section 360bbb-3(b)(1), unless the authorization is terminated  or revoked sooner.       Influenza A by PCR NEGATIVE NEGATIVE Final   Influenza B by PCR NEGATIVE NEGATIVE Final    Comment: (NOTE) The Xpert Xpress SARS-CoV-2/FLU/RSV plus assay is intended as an aid in the diagnosis of influenza from Nasopharyngeal swab specimens and should not be used as a sole basis for treatment. Nasal washings and aspirates are unacceptable for Xpert Xpress SARS-CoV-2/FLU/RSV testing.  Fact Sheet for Patients: bloggercourse.com  Fact Sheet for Healthcare Providers: seriousbroker.it  This test is not yet approved or cleared by the [place]  FDA and has been authorized for detection and/or diagnosis of SARS-CoV-2 by FDA under an Emergency Use Authorization (EUA). This EUA will remain in effect (meaning this test can be used) for the duration of the COVID-19 declaration under Section 564(b)(1) of the Act, 21 U.S.C. section 360bbb-3(b)(1), unless the authorization is terminated or revoked.  Performed at [facility], [address] [place], [place] [address]     Coagulation Studies: Recent Labs    10/18/24 1248  LABPROT 18.2*  INR 1.4*    Urinalysis: No results for input(s):  COLORURINE, LABSPEC, PHURINE, GLUCOSEU, HGBUR, BILIRUBINUR, KETONESUR, PROTEINUR, UROBILINOGEN, NITRITE, LEUKOCYTESUR in the last 168 hours.  Invalid input(s): APPERANCEUR  Lipid Panel:     Component Value Date/Time   CHOL 160 09/29/2021 0900   TRIG 69 09/29/2021 0900   HDL 34 (L) 09/29/2021 0900   CHOLHDL 4.7 09/29/2021 0900   LDLCALC 112 (H) 09/29/2021 0900    HgbA1C:  Lab Results  Component Value Date   HGBA1C 6.6 (H) 02/09/2021    Urine Drug Screen:  No results found for: LABOPIA, COCAINSCRNUR, LABBENZ, AMPHETMU, THCU, LABBARB  Alcohol Level:  Recent Labs  Lab 10/18/24 1248  ETH <15     Imaging: CT HEAD WO CONTRAST Result Date: 10/18/2024 CLINICAL DATA:  Follow-up stroke. EXAM: CT HEAD WITHOUT CONTRAST TECHNIQUE: Contiguous axial images were obtained from the base of the skull through the vertex without intravenous contrast. RADIATION DOSE REDUCTION: This exam was performed according to the departmental dose-optimization program which includes automated exposure control, adjustment of the mA and/or kV according to patient size and/or use of iterative reconstruction technique. COMPARISON:  None Available. FINDINGS: Brain: Ventricles and cisterns are normal. There is mild low-attenuation over the gray-white matter over the right posterior temporal/parietal region with mild effacement of the sulci. This likely represents patient's known recent stroke. Possible minimal acute subarachnoid hemorrhage/petechial hemorrhage over the  sulci in this region. 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 Remainder of the exam is unremarkable. Vascular: No hyperdense vessel or unexpected calcification. Skull: Normal. Negative for fracture or focal lesion. Sinuses/Orbits: Orbits are normal symmetric. Paranasal sinuses are well developed and demonstrate minimal mucosal membrane thickening over the maxillary and ethmoid sinuses. There is moderate opacification with  mucoperiosteal thickening involving the sphenoid sinus. Other: None. IMPRESSION: 1. Mild low-attenuation over the gray-white matter over the right posterior temporal/parietal region with mild effacement of the sulci. This likely represents patient's known recent infarct. Possible minimal acute subarachnoid hemorrhage/petechial hemorrhage over the sulci in this region. 2. 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 Critical Value/emergent results were called by telephone at the time of interpretation on 10/18/2024 at 2:58 pm to provider Ozell Klein, who verbally acknowledged these results. Electronically Signed   By: Toribio Agreste M.D.   On: 10/18/2024 14:59     Assessment/Plan: 73 y.o. male with a history of CHF, CKD, HLD, HTN and arthritis who reports that at about 2100 on 12/9 experienced acute onset of RUE numbness and weakness.  Also had slurred speech.  By morning symptoms had improved significantly.  Feels he is back to baseline today but felt her should come in to be evaluated.  On exam mild weakness noted in RUE.  Head CT personally reviewed.  CT shows an area of low attenuation in the right temporo-parietal region with mild effacement and possible associated SAH/petechial hemorrhage and a chronic left cerebellar infarct.  This is not consistent with the patient's presenting symptoms which were on the right as well.   Coags elevated.    Recommendations: Would hold antiplatelet therapy at this time.    Frequent neuro checks 3.   HgbA1c, fasting lipid panel.  Goal A1c<7.0, goal LDL<70 4.   PT consult, OT consult, Speech consult 5.   Echocardiogram 6.   NPO until RN stroke swallow screen 7.   Telemetry monitoring 8.   MRI of the brain without contrast.  Due to renal insufficiency will also perform MRA of the head and carotid dopplers to spare contrast exposure.     Case discussed with Dr. Klein Sonny Hock, MD Neurology  10/18/2024, 4:05 PM           [1] No Known Allergies

## 2024-10-18 NOTE — ED Notes (Signed)
 See triage note  Presents with some TIA's sxs'  States he developed  sxs' 2-3 days ago   States he had some numbness in right arm and some slurred speech States sxs' cleared up yesterday  Denies any xs' at present

## 2024-10-18 NOTE — ED Notes (Signed)
 Patient transported to MRI

## 2024-10-18 NOTE — ED Notes (Signed)
 Pt given a cup of water 

## 2024-10-18 NOTE — ED Provider Notes (Signed)
 [facility] Provider Note    Event Date/Time   First MD Initiated Contact with Patient 10/18/24 1438     (approximate)   History   possible stroke  Pt arrives via POV with c/o a possible stroke. Pt LKW was 2 days ago. Pt's symptoms were right arm numbness, slurring words, drooling which have all resolved. The symptoms lasted 24 hours, but pt refused to go to the hospital. Pt is A&Ox4 during triage.    HPI Tyler Ochoa is a 73 y.o. male PMH hypertension, CHF, CKD, hyperlipidemia, CAD presents for evaluation of recent strokelike symptoms - 2 nights ago (Tuesday) patient noticed that his right hand and forearm became completely numb, could not feel them when touching with his other hand.  Called his girlfriend in the room and then shortly thereafter started having notable garbling of his speech/slurring of his words and was drooling out the right side of his mouth when he attempted to drink anything.  They did not notice any obvious facial droop at that time. - Tells me the symptoms resolved the next day (yesterday).  Did not want to come to the hospital at time of symptoms but has since developed concern for possible stroke. - Notes had a similar episode several months ago but that this resolved after 3 hours, did not seek medical care at that time.  No known history of prior stroke. - Currently asymptomatic and feels he is back at baseline.  Girlfriend bedside confirms normal speech and behavior. - Not on blood thinners       Physical Exam   Triage Vital Signs: ED Triage Vitals  Encounter Vitals Group     BP 10/18/24 1241 105/73     Girls Systolic BP Percentile --      Girls Diastolic BP Percentile --      Boys Systolic BP Percentile --      Boys Diastolic BP Percentile --      Pulse Rate 10/18/24 1241 (!) 118     Resp 10/18/24 1241 18     Temp 10/18/24 1241 97.6 F (36.4 C)     Temp Source 10/18/24 1241 Oral     SpO2 10/18/24 1241 98 %      Weight 10/18/24 1242 247 lb (112 kg)     Height 10/18/24 1242 6' 3 (1.905 m)     Head Circumference --      Peak Flow --      Pain Score 10/18/24 1241 0     Pain Loc --      Pain Education --      Exclude from Growth Chart --     Most recent vital signs: Vitals:   10/18/24 1241  BP: 105/73  Pulse: (!) 118  Resp: 18  Temp: 97.6 F (36.4 C)  SpO2: 98%     General: Awake, no distress.  CV:  Good peripheral perfusion. RRR, RP 2+ Resp:  Normal effort. CTAB Abd:  No distention. Nontender to deep palpation throughout Neuro:  Aox4, CN II-XII intact, FNF wnl, finger taps fast b/l, 5/5 strength in bilateral finger extension/grip, arm flexion/extension, EHL/FHL. BUE AG 10+ sec no drift, BLE AG 5+ sec no drift.  SI LT.  Gait testing deferred.  Some nystagmus noted with rightward and upward gaze.  1a  Level of consciousness: 0=alert; keenly responsive  1b. LOC questions:  0=Performs both tasks correctly  1c. LOC commands: 0=Performs both tasks correctly  2.  Best Gaze: 0=normal  3.  Visual: 0=No visual loss  4. Facial Palsy: 0=Normal symmetric movement  5a.  Motor left arm: 0=No drift, limb holds 90 (or 45) degrees for full 10 seconds  5b.  Motor right arm: 0=No drift, limb holds 90 (or 45) degrees for full 10 seconds  6a. motor left leg: 0=No drift, limb holds 90 (or 45) degrees for full 10 seconds  6b  Motor right leg:  0=No drift, limb holds 90 (or 45) degrees for full 10 seconds  7. Limb Ataxia: 0=Absent  8.  Sensory: 0=Normal; no sensory loss  9. Best Language:  0=No aphasia, normal  10. Dysarthria: 0=Normal  11. Extinction and Inattention: 0=No abnormality  12. Distal motor function: 0=Normal   Total:   0       ED Results / Procedures / Treatments   Labs (all labs ordered are listed, but only abnormal results are displayed) Labs Reviewed  PROTIME-INR - Abnormal; Notable for the following components:      Result Value   Prothrombin Time 18.2 (*)    INR 1.4 (*)     All other components within normal limits  APTT - Abnormal; Notable for the following components:   aPTT 47 (*)    All other components within normal limits  CBC - Abnormal; Notable for the following components:   Platelets 466 (*)    All other components within normal limits  COMPREHENSIVE METABOLIC PANEL WITH GFR - Abnormal; Notable for the following components:   Sodium 132 (*)    Chloride 92 (*)    Glucose, Bld 107 (*)    BUN 58 (*)    Creatinine, Ser 2.26 (*)    Alkaline Phosphatase 159 (*)    GFR, Estimated 30 (*)    Anion gap 16 (*)    All other components within normal limits  CBG MONITORING, ED - Abnormal; Notable for the following components:   Glucose-Capillary 109 (*)    All other components within normal limits  DIFFERENTIAL  ETHANOL     EKG  Ecg = flutter with variable block, rate 97, no gross ST elevation or depression, no significant repolarization abnormality, normal axis, normal intervals.  Right bundle branch block present.   RADIOLOGY Radiology interpreted by myself and radiology report reviewed.  Notable for evidence of right-sided CVA.  Also small surrounding subarachnoid/petechial hemorrhage.    PROCEDURES:  Critical Care performed: Yes, see critical care procedure note(s)  .Critical Care  Performed by: Clarine Ozell LABOR, MD Authorized by: Clarine Ozell LABOR, MD   Critical care provider statement:    Critical care time (minutes):  30   Critical care time was exclusive of:  Separately billable procedures and treating other patients   Critical care was necessary to treat or prevent imminent or life-threatening deterioration of the following conditions:  CNS failure or compromise   Critical care was time spent personally by me on the following activities:  Development of treatment plan with patient or surrogate, discussions with consultants, evaluation of patient's response to treatment, examination of patient, ordering and review of laboratory studies,  ordering and review of radiographic studies, ordering and performing treatments and interventions, pulse oximetry, re-evaluation of patient's condition and review of old charts   I assumed direction of critical care for this patient from another provider in my specialty: no     Care discussed with: admitting provider      MEDICATIONS ORDERED IN ED: Medications  sodium chloride  0.9 % bolus 500 mL (has no administration in time range)  IMPRESSION / MDM / ASSESSMENT AND PLAN / ED COURSE  I reviewed the triage vital signs and the nursing notes.                              DDX/MDM/AP: Differential diagnosis includes, but is not limited to, CVA/TIA, consider underlying electrolyte abnormality, no history to suggest underlying infection.  Fortunately reassuring neurologic exam at this time.  Plan: - Labs  -EKG - CT head   Patient's presentation is most consistent with acute presentation with potential threat to life or bodily function.  The patient is on the cardiac monitor to evaluate for evidence of arrhythmia and/or significant heart rate changes.  ED course below.  CT head with apparent subacute right-sided infarct with mild surrounding hemorrhage, consider possibility of hemorrhagic conversion.  Neurology consulted, recommend avoiding antiplatelets at this time, will evaluate patient and provide further recommendations.  Admitting to hospitalist service.  Laboratory workup unremarkable for mild hyponatremia and mild bump in creatinine, giving small bolus IV fluid.  EKG does show new atrial flutter, may have contributed to stroke.  Clinical Course as of 10/18/24 1539  Thu Oct 18, 2024  1457 Call from dr. Jearld of radiology - e/o posterior r temporal-parietal region, likely subacute -Small areas of likely surrounding hemorrhage [MM]  1506 CTH: IMPRESSION: 1. Mild low-attenuation over the gray-white matter over the right posterior temporal/parietal region with mild effacement  of the sulci. This likely represents patient's known recent infarct. Possible minimal acute subarachnoid hemorrhage/petechial hemorrhage over the sulci in this region. 2. 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  Critical Value/emergent results were called by telephone at the time of interpretation on 10/18/2024 at 2:58 pm to provider Ozell Klein, who verbally acknowledged these results.   [MM]  1507 CMP with very mild hyponatremia, mild bump in creatinine but within baseline range  Cbc unremarkable [MM]  1526 D/w Dr. Germaine of neuro -hold any antiplatelets at this time -Admit to medicine -She will evaluate patient and provide further recommendations for ongoing management  Hospitalist consult order placed [MM]    Clinical Course User Index [MM] Klein Ozell LABOR, MD     FINAL CLINICAL IMPRESSION(S) / ED DIAGNOSES   Final diagnoses:  Cerebrovascular accident (CVA), unspecified mechanism (HCC)  Subarachnoid hemorrhage (HCC)  Atrial flutter, unspecified type (HCC)  AKI (acute kidney injury)     Rx / DC Orders   ED Discharge Orders     None        Note:  This document was prepared using Dragon voice recognition software and may include unintentional dictation errors.   Klein Ozell LABOR, MD 10/18/24 1539

## 2024-10-19 ENCOUNTER — Encounter: Payer: Self-pay | Admitting: Hospitalist

## 2024-10-19 ENCOUNTER — Observation Stay: Admit: 2024-10-19 | Discharge: 2024-10-19 | Disposition: A | Attending: Hospitalist

## 2024-10-19 DIAGNOSIS — I639 Cerebral infarction, unspecified: Secondary | ICD-10-CM | POA: Diagnosis not present

## 2024-10-19 DIAGNOSIS — I5022 Chronic systolic (congestive) heart failure: Secondary | ICD-10-CM

## 2024-10-19 DIAGNOSIS — I4892 Unspecified atrial flutter: Secondary | ICD-10-CM

## 2024-10-19 DIAGNOSIS — N1832 Chronic kidney disease, stage 3b: Secondary | ICD-10-CM | POA: Diagnosis not present

## 2024-10-19 DIAGNOSIS — I6389 Other cerebral infarction: Secondary | ICD-10-CM

## 2024-10-19 LAB — ECHOCARDIOGRAM COMPLETE
AR max vel: 2.53 cm2
AV Area VTI: 2.41 cm2
AV Area mean vel: 2.41 cm2
AV Mean grad: 2 mmHg
AV Peak grad: 3 mmHg
Ao pk vel: 0.86 m/s
Area-P 1/2: 3.4 cm2
Height: 75 in
MV VTI: 3.69 cm2
S' Lateral: 4.2 cm
Weight: 3952 [oz_av]

## 2024-10-19 LAB — LIPID PANEL
Cholesterol: 193 mg/dL (ref 0–200)
HDL: 34 mg/dL — ABNORMAL LOW (ref >40)
LDL Cholesterol: 141 mg/dL — ABNORMAL HIGH (ref 0–99)
Total CHOL/HDL Ratio: 5.7 ratio
Triglycerides: 90 mg/dL (ref <150)
VLDL: 18 mg/dL (ref 0–40)

## 2024-10-19 LAB — HEMOGLOBIN A1C
Hgb A1c MFr Bld: 5.7 % — ABNORMAL HIGH (ref 4.8–5.6)
Mean Plasma Glucose: 116.89 mg/dL

## 2024-10-19 MED ORDER — PERFLUTREN LIPID MICROSPHERE
1.0000 mL | INTRAVENOUS | Status: AC | PRN
  Administered 2024-10-19: 2 mL via INTRAVENOUS

## 2024-10-19 MED ADMIN — Sacubitril-Valsartan Tab 24-26 MG: 1 | ORAL | NDC 72205028018

## 2024-10-19 NOTE — Progress Notes (Addendum)
 Subjective: Patient stable overnight.  No new neurological complaints.    Objective: Current vital signs: BP (!) 108/95   Pulse 83   Temp 97.6 F (36.4 C) (Oral)   Resp 20   Ht 6' 3 (1.905 m)   Wt 112 kg   SpO2 98%   BMI 30.87 kg/m  Vital signs in last 24 hours: Temp:  [97.1 F (36.2 C)-97.9 F (36.6 C)] 97.6 F (36.4 C) (12/12 0749) Pulse Rate:  [68-118] 83 (12/12 0600) Resp:  [18-23] 20 (12/12 0600) BP: (97-115)/(60-95) 108/95 (12/12 0600) SpO2:  [93 %-99 %] 98 % (12/12 0600) Weight:  [887 kg] 112 kg (12/11 1242)  Intake/Output from previous day: 12/11 0701 - 12/12 0700 In: 340.7 [I.V.:340.7] Out: 600 [Urine:600] Intake/Output this shift: No intake/output data recorded. Nutritional status:  Diet Order             Diet Heart Room service appropriate? Yes; Fluid consistency: Thin  Diet effective now                   Neurologic Exam: Mental Status: Alert, oriented, thought content appropriate.  Speech fluent without evidence of aphasia.  Able to follow 3 step commands without difficulty.  Mild dysarthria Cranial Nerves: II: Visual fields grossly normal III,IV, VI: ptosis not present, extra-ocular motions intact bilaterally V,VII: smile symmetric, facial light touch sensation normal bilaterally VIII: hearing normal bilaterally XI: bilateral shoulder shrug XII: midline tongue extension Motor: Right :Upper extremity   mild pronator drift                                                               Left:     Upper extremity   5/5             Lower extremity   unable to lift off bed                                                                       Lower extremity   5-/5   Sensory: Pinprick and light touch intact throughout, bilaterally   Lab Results: Basic Metabolic Panel: Recent Labs  Lab 10/18/24 1248  NA 132*  K 4.8  CL 92*  CO2 23  GLUCOSE 107*  BUN 58*  CREATININE 2.26*  CALCIUM  9.7    Liver Function Tests: Recent Labs  Lab  10/18/24 1248  AST 19  ALT 6  ALKPHOS 159*  BILITOT 0.5  PROT 8.1  ALBUMIN 3.5   No results for input(s): LIPASE, AMYLASE in the last 168 hours. No results for input(s): AMMONIA in the last 168 hours.  CBC: Recent Labs  Lab 10/18/24 1248  WBC 7.0  NEUTROABS 4.6  HGB 14.5  HCT 44.2  MCV 88.2  PLT 466*    Cardiac Enzymes: No results for input(s): CKTOTAL, CKMB, CKMBINDEX, TROPONINI in the last 168 hours.  Lipid Panel: Recent Labs  Lab 10/19/24 0307  CHOL 193  TRIG 90  HDL 34*  CHOLHDL 5.7  VLDL 18  LDLCALC 858*  CBG: Recent Labs  Lab 10/18/24 1247  GLUCAP 109*    Microbiology: Results for orders placed or performed during the hospital encounter of 08/28/21  Resp Panel by RT-PCR (Flu A&B, Covid) Nasopharyngeal Swab     Status: None   Collection Time: 08/28/21  9:43 PM   Specimen: Nasopharyngeal Swab; Nasopharyngeal(NP) swabs in vial transport medium  Result Value Ref Range Status   SARS Coronavirus 2 by RT PCR NEGATIVE NEGATIVE Final    Comment: (NOTE) SARS-CoV-2 target nucleic acids are NOT DETECTED.  The SARS-CoV-2 RNA is generally detectable in upper respiratory specimens during the acute phase of infection. The lowest concentration of SARS-CoV-2 viral copies this assay can detect is 138 copies/mL. A negative result does not preclude SARS-Cov-2 infection and should not be used as the sole basis for treatment or other patient management decisions. A negative result may occur with  improper specimen collection/handling, submission of specimen other than nasopharyngeal swab, presence of viral mutation(s) within the areas targeted by this assay, and inadequate number of viral copies(<138 copies/mL). A negative result must be combined with clinical observations, patient history, and epidemiological information. The expected result is Negative.  Fact Sheet for Patients:  bloggercourse.com  Fact Sheet for  Healthcare Providers:  seriousbroker.it  This test is no t yet approved or cleared by the [place]  FDA and  has been authorized for detection and/or diagnosis of SARS-CoV-2 by FDA under an Emergency Use Authorization (EUA). This EUA will remain  in effect (meaning this test can be used) for the duration of the COVID-19 declaration under Section 564(b)(1) of the Act, 21 U.S.C.section 360bbb-3(b)(1), unless the authorization is terminated  or revoked sooner.       Influenza A by PCR NEGATIVE NEGATIVE Final   Influenza B by PCR NEGATIVE NEGATIVE Final    Comment: (NOTE) The Xpert Xpress SARS-CoV-2/FLU/RSV plus assay is intended as an aid in the diagnosis of influenza from Nasopharyngeal swab specimens and should not be used as a sole basis for treatment. Nasal washings and aspirates are unacceptable for Xpert Xpress SARS-CoV-2/FLU/RSV testing.  Fact Sheet for Patients: bloggercourse.com  Fact Sheet for Healthcare Providers: seriousbroker.it  This test is not yet approved or cleared by the [place]  FDA and has been authorized for detection and/or diagnosis of SARS-CoV-2 by FDA under an Emergency Use Authorization (EUA). This EUA will remain in effect (meaning this test can be used) for the duration of the COVID-19 declaration under Section 564(b)(1) of the Act, 21 U.S.C. section 360bbb-3(b)(1), unless the authorization is terminated or revoked.  Performed at [facility], [address] [place], [place] [address]   SARS CORONAVIRUS 2 (TAT 6-24 HRS) Nasopharyngeal Nasopharyngeal Swab     Status: None   Collection Time: 09/06/21 12:00 PM   Specimen: Nasopharyngeal Swab  Result Value Ref Range Status   SARS Coronavirus 2 NEGATIVE NEGATIVE Final    Comment: (NOTE) SARS-CoV-2 target nucleic acids are NOT DETECTED.  The SARS-CoV-2 RNA is generally detectable in upper and  lower respiratory specimens during the acute phase of infection. Negative results do not preclude SARS-CoV-2 infection, do not rule out co-infections with other pathogens, and should not be used as the sole basis for treatment or other patient management decisions. Negative results must be combined with clinical observations, patient history, and epidemiological information. The expected result is Negative.  Fact Sheet for Patients: hairslick.no  Fact Sheet for Healthcare Providers: quierodirigir.com  This test is not yet approved or cleared by the [place]   FDA and  has been authorized for detection and/or diagnosis of SARS-CoV-2 by FDA under an Emergency Use Authorization (EUA). This EUA will remain  in effect (meaning this test can be used) for the duration of the COVID-19 declaration under Se ction 564(b)(1) of the Act, 21 U.S.C. section 360bbb-3(b)(1), unless the authorization is terminated or revoked sooner.  Performed at [facility], [address] [place], [place] [address]   Resp Panel by RT-PCR (Flu A&B, Covid) Nasopharyngeal Swab     Status: None   Collection Time: 09/09/21 12:40 PM   Specimen: Nasopharyngeal Swab; Nasopharyngeal(NP) swabs in vial transport medium  Result Value Ref Range Status   SARS Coronavirus 2 by RT PCR NEGATIVE NEGATIVE Final    Comment: (NOTE) SARS-CoV-2 target nucleic acids are NOT DETECTED.  The SARS-CoV-2 RNA is generally detectable in upper respiratory specimens during the acute phase of infection. The lowest concentration of SARS-CoV-2 viral copies this assay can detect is 138 copies/mL. A negative result does not preclude SARS-Cov-2 infection and should not be used as the sole basis for treatment or other patient management decisions. A negative result may occur with  improper specimen collection/handling, submission of specimen other than nasopharyngeal swab, presence of  viral mutation(s) within the areas targeted by this assay, and inadequate number of viral copies(<138 copies/mL). A negative result must be combined with clinical observations, patient history, and epidemiological information. The expected result is Negative.  Fact Sheet for Patients:  bloggercourse.com  Fact Sheet for Healthcare Providers:  seriousbroker.it  This test is no t yet approved or cleared by the [place]  FDA and  has been authorized for detection and/or diagnosis of SARS-CoV-2 by FDA under an Emergency Use Authorization (EUA). This EUA will remain  in effect (meaning this test can be used) for the duration of the COVID-19 declaration under Section 564(b)(1) of the Act, 21 U.S.C.section 360bbb-3(b)(1), unless the authorization is terminated  or revoked sooner.       Influenza A by PCR NEGATIVE NEGATIVE Final   Influenza B by PCR NEGATIVE NEGATIVE Final    Comment: (NOTE) The Xpert Xpress SARS-CoV-2/FLU/RSV plus assay is intended as an aid in the diagnosis of influenza from Nasopharyngeal swab specimens and should not be used as a sole basis for treatment. Nasal washings and aspirates are unacceptable for Xpert Xpress SARS-CoV-2/FLU/RSV testing.  Fact Sheet for Patients: bloggercourse.com  Fact Sheet for Healthcare Providers: seriousbroker.it  This test is not yet approved or cleared by the [place]  FDA and has been authorized for detection and/or diagnosis of SARS-CoV-2 by FDA under an Emergency Use Authorization (EUA). This EUA will remain in effect (meaning this test can be used) for the duration of the COVID-19 declaration under Section 564(b)(1) of the Act, 21 U.S.C. section 360bbb-3(b)(1), unless the authorization is terminated or revoked.  Performed at [facility], [address] [place], [place] [address]     Coagulation  Studies: Recent Labs    10/18/24 1248  LABPROT 18.2*  INR 1.4*    Imaging: MR ANGIO HEAD WO CONTRAST Result Date: 10/18/2024 EXAM: MR Angiography Head without intravenous contrast. 10/18/2024 08:43:29 PM TECHNIQUE: Magnetic resonance angiography images of the head without intravenous contrast. Multiplanar 2D and 3D reformatted images are provided for review. COMPARISON: None provided. CLINICAL HISTORY: Neuro deficit, acute, stroke suspected Neuro deficit, acute, stroke suspected FINDINGS: ANTERIOR CIRCULATION: No significant stenosis of the internal carotid arteries. No significant stenosis of the anterior cerebral arteries. No significant stenosis of the middle  cerebral arteries. No aneurysm. POSTERIOR CIRCULATION: No significant stenosis of the posterior cerebral arteries. No significant stenosis of the basilar artery. No flow related signal in the right V3 and intradural vertebral artery suggesting occlusion or severe stenosis. No aneurysm. IMPRESSION: 1. No flow related signal in the right V3 and intradural vertebral artery suggesting occlusion or severe stenosis. A CTA or MRA neck could further characterize. 2. Otherwise, no significant stenosis. Electronically signed by: Gilmore Molt 10/18/2024 09:15 PM EST RP Workstation: [id number]   MR BRAIN WO CONTRAST Result Date: 10/18/2024 EXAM: MRI Brain Without Contrast 10/18/2024 08:43:29 PM TECHNIQUE: Multiplanar multisequence MRI of the head/brain was performed without the administration of intravenous contrast. COMPARISON: CT head from earlier CLINICAL HISTORY: Neuro deficit, acute, stroke suspected FINDINGS: BRAIN AND VENTRICLES: Acute or subacute posterior right MCA territory infarcts involving the posterior right temporal lobe and parietal lobe. Associated petechial hemorrhage. Small acute or subacute infarct in the left corona radiata. Associated edema without mass effect. No midline shift. Remote left larger than right cerebellar infarcts.  No mass occupying  intracranial hemorrhage. No mass. No hydrocephalus. The sella is unremarkable. Normal flow voids. ORBITS: No acute abnormality. SINUSES AND MASTOIDS: Opacified left sphenoid sinus and scattered mucosal thickening. BONES AND SOFT TISSUES: Normal marrow signal. IMPRESSION: 1. Acute or subacute posterior right MCA territory infarcts with associated petechial hemorrhage. 2. Small acute or subacute infarct in the left corona radiata. 3. Remote left larger than right cerebellar infarcts. Electronically signed by: Gilmore Molt 10/18/2024 09:05 PM EST RP Workstation: [id number]   US  Carotid Bilateral Result Date: 10/18/2024 CLINICAL DATA:  Hyperlipidemia, coronary artery disease, stroke symptoms EXAM: BILATERAL CAROTID DUPLEX ULTRASOUND TECHNIQUE: Elnor scale imaging, color Doppler and duplex ultrasound were performed of bilateral carotid and vertebral arteries in the neck. COMPARISON:  01/12/2007 FINDINGS: Criteria: Quantification of carotid stenosis is based on velocity parameters that correlate the residual internal carotid diameter with NASCET-based stenosis levels, using the diameter of the distal internal carotid lumen as the denominator for stenosis measurement. The following velocity measurements were obtained: RIGHT ICA: 60/19 cm/sec CCA: 55/10 cm/sec SYSTOLIC ICA/CCA RATIO:  1.1 ECA: 44 cm/sec LEFT ICA: 62/21 cm/sec CCA: 57/15 cm/sec SYSTOLIC ICA/CCA RATIO:  1.1 ECA: 48 cm/sec RIGHT CAROTID ARTERY: Mild irregular atheromatous plaque within the right carotid bulb and proximal right ICA. No significant stenosis by grayscale imaging. Normal arterial waveforms. RIGHT VERTEBRAL ARTERY:  Antegrade.  Normal arterial waveforms. LEFT CAROTID ARTERY: Mild atheromatous plaque within the left carotid bulb extending into the left ICA. No significant stenosis by grayscale imaging. Normal arterial waveforms. LEFT VERTEBRAL ARTERY:  Antegrade, normal arterial waveforms. IMPRESSION: 1. Estimated 0-49%  stenosis within the bilateral internal carotid arteries. Electronically Signed   By: Ozell Daring M.D.   On: 10/18/2024 19:20   CT HEAD WO CONTRAST Result Date: 10/18/2024 CLINICAL DATA:  Follow-up stroke. EXAM: CT HEAD WITHOUT CONTRAST TECHNIQUE: Contiguous axial images were obtained from the base of the skull through the vertex without intravenous contrast. RADIATION DOSE REDUCTION: This exam was performed according to the departmental dose-optimization program which includes automated exposure control, adjustment of the mA and/or kV according to patient size and/or use of iterative reconstruction technique. COMPARISON:  None Available. FINDINGS: Brain: Ventricles and cisterns are normal. There is mild low-attenuation over the gray-white matter over the right posterior temporal/parietal region with mild effacement of the sulci. This likely represents patient's known recent stroke. Possible minimal acute subarachnoid hemorrhage/petechial hemorrhage over the sulci in this region. 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 Remainder  of the exam is unremarkable. Vascular: No hyperdense vessel or unexpected calcification. Skull: Normal. Negative for fracture or focal lesion. Sinuses/Orbits: Orbits are normal symmetric. Paranasal sinuses are well developed and demonstrate minimal mucosal membrane thickening over the maxillary and ethmoid sinuses. There is moderate opacification with mucoperiosteal thickening involving the sphenoid sinus. Other: None. IMPRESSION: 1. Mild low-attenuation over the gray-white matter over the right posterior temporal/parietal region with mild effacement of the sulci. This likely represents patient's known recent infarct. Possible minimal acute subarachnoid hemorrhage/petechial hemorrhage over the sulci in this region. 2. 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 Critical Value/emergent results were called by telephone at the time of interpretation on  10/18/2024 at 2:58 pm to provider Ozell Klein, who verbally acknowledged these results. Electronically Signed   By: Toribio Agreste M.D.   On: 10/18/2024 14:59    Medications: I have reviewed the patient's current medications. Scheduled:   stroke: early stages of recovery book   Does not apply Once   allopurinol   200 mg Oral Daily   carvedilol   3.125 mg Oral BID WC   sacubitril -valsartan   1 tablet Oral BID   spironolactone   12.5 mg Oral Daily   torsemide   40 mg Oral Daily    Assessment/Plan: 73 y.o. male with a history of CHF, CKD, HLD, HTN and arthritis who reports that at about 2100 on 12/9 experienced acute onset of RUE numbness and weakness.  Also had slurred speech.  By morning symptoms had improved significantly.  Feels he is back to baseline today but felt her should come in to be evaluated.  On exam mild weakness noted in RUE.  Head CT personally reviewed.  CT shows an area of low attenuation in the right temporo-parietal region with mild effacement and possible associated SAH/petechial hemorrhage and a chronic left cerebellar infarct.  This is not consistent with the patient's presenting symptoms which were on the right as well.   MRI of the brain personally reviewed and reveals acute right MCA territory infarcts with associated petechial hemorrhage and a small acute/subacute left corona radiata infarct.  Concern is for cardioembolic etiology.  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  Carotid dopplers shows no hemodynamically significant stenosis in the carotids bilaterally.  EKG with aflutter.  Echocardiogram pending.   Coags elevated. With cardioembolic infarcts and aflutter on imaging patient would usually be a candidate for anticoagulation but concerned about this wit his elevated coags.  Petechial hemorrhage already noted on imaging currently.   LDL 141, A1c pending  Recommendations: ASA 81mg  daily Would recheck head CT without contrast prior to discharge Echocardiogram  pending High intensity statin initiation BP control Cardiology input may be necessary for further follow up     LOS: 0 days   Tyler Hock, MD Neurology  10/19/2024  9:47 AM

## 2024-10-19 NOTE — Evaluation (Signed)
 Physical Therapy Evaluation Patient Details Name: Tyler Ochoa MRN: [id number] DOB: May 03, 1951 Today's Date: 10/19/2024  History of Present Illness  Pt is a 73 y/o M admitted on 10/18/24 after presenting with RUE weakness & slurred speech that resolved. 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 PMH: CHF, cardiac amyloidosis, CKD, HLD, HTN, arthritis  Clinical Impression  Pt seen for PT evaluation with pt agreeable to tx. Pt reports prior to admission he was able to perform transfers to/from w/c & propel w/c with BLE, living with his girlfriend. On this date, pt is able to complete bed mobility with mod I, declines sit<>Stand 2/2 R knee pain but is able to scoot along EOB to R without assistance. Recommend ongoing PT services to progress mobility as able.    If plan is discharge home, recommend the following: A little help with walking and/or transfers;A little help with bathing/dressing/bathroom;Assistance with cooking/housework;Assist for transportation;Help with stairs or ramp for entrance   Can travel by private vehicle        Equipment Recommendations None recommended by PT  Recommendations for Other Services       Functional Status Assessment Patient has had a recent decline in their functional status and demonstrates the ability to make significant improvements in function in a reasonable and predictable amount of time.     Precautions / Restrictions Precautions Precautions: Fall Restrictions Weight Bearing Restrictions Per Provider Order: No      Mobility  Bed Mobility Overal bed mobility: Needs Assistance Bed Mobility: Supine to Sit, Sit to Supine     Supine to sit: Modified independent (Device/Increase time), HOB elevated Sit to supine: Modified independent (Device/Increase time), HOB  elevated (extra time, uses UE to assist RLE onto bed PRN)        Transfers                   General transfer comment: pt declined 2/2 R knee pain    Ambulation/Gait                  Stairs            Wheelchair Mobility     Tilt Bed    Modified Rankin (Stroke Patients Only)       Balance Overall balance assessment: Needs assistance Sitting-balance support: Feet supported Sitting balance-Leahy Scale: Good Sitting balance - Comments: scoot to R on EOB                                     Pertinent Vitals/Pain Pain Assessment Pain Assessment: Faces Faces Pain Scale: Hurts whole lot Pain Location: R knee, arthritic pain Pain Descriptors / Indicators: Discomfort Pain Intervention(s): Limited activity within patient's tolerance, Monitored during session    Home Living Family/patient expects to be discharged to:: Private residence Living Arrangements: Spouse/significant other (girlfriend) Available Help at Discharge: Family;Available 24 hours/day Type of Home: Apartment Home Access: Level entry       Home Layout: One level Home Equipment: BSC/3in1;Rolling Walker (2 wheels);Cane - single point;Wheelchair - manual;Hospital bed      Prior Function               Mobility Comments: transfers to w/c at baseline, propels w/c with BLE       Extremity/Trunk Assessment  Upper Extremity Assessment Upper Extremity Assessment: Overall WFL for tasks assessed    Lower Extremity Assessment Lower Extremity Assessment: Generalized weakness;RLE deficits/detail (hx of BLE arthritic knee pain (wears braces PRN)) RLE Deficits / Details: 2+/5 R knee extension       Communication   Communication Communication: No apparent difficulties    Cognition Arousal: Alert Behavior During Therapy: WFL for tasks assessed/performed   PT - Cognitive impairments: No apparent impairments                         Following commands:  Intact       Cueing Cueing Techniques: Verbal cues     General Comments      Exercises     Assessment/Plan    PT Assessment Patient needs continued PT services  PT Problem List Decreased strength;Pain;Cardiopulmonary status limiting activity;Decreased coordination;Decreased range of motion;Decreased activity tolerance;Decreased knowledge of use of DME;Decreased balance;Decreased safety awareness;Decreased mobility;Decreased knowledge of precautions       PT Treatment Interventions Therapeutic exercise;DME instruction;Gait training;Balance training;Stair training;Functional mobility training;Therapeutic activities;Neuromuscular re-education;Patient/family education    PT Goals (Current goals can be found in the Care Plan section)  Acute Rehab PT Goals Patient Stated Goal: R knee pain to decrease PT Goal Formulation: With patient Time For Goal Achievement: 11/02/24 Potential to Achieve Goals: Good    Frequency Min 1X/week     Co-evaluation               AM-PAC PT 6 Clicks Mobility  Outcome Measure Help needed turning from your back to your side while in a flat bed without using bedrails?: None Help needed moving from lying on your back to sitting on the side of a flat bed without using bedrails?: None Help needed moving to and from a bed to a chair (including a wheelchair)?: A Little Help needed standing up from a chair using your arms (e.g., wheelchair or bedside chair)?: A Little Help needed to walk in hospital room?: A Lot Help needed climbing 3-5 steps with a railing? : Total 6 Click Score: 17    End of Session   Activity Tolerance: Patient tolerated treatment well;Patient limited by pain Patient left: in bed;with call bell/phone within reach;with bed alarm set   PT Visit Diagnosis: Difficulty in walking, not elsewhere classified (R26.2);Other abnormalities of gait and mobility (R26.89);Pain;Muscle weakness (generalized) (M62.81) Pain - Right/Left:  Right Pain - part of body: Knee    Time: 0926-0938 PT Time Calculation (min) (ACUTE ONLY): 12 min   Charges:   PT Evaluation $PT Eval Low Complexity: 1 Low   PT General Charges $$ ACUTE PT VISIT: 1 Visit         Richerd Pinal, PT, DPT 10/19/2024, 10:18 AM   Richerd CHRISTELLA Pinal 10/19/2024, 10:16 AM

## 2024-10-19 NOTE — Evaluation (Signed)
 Occupational Therapy Evaluation Patient Details Name: Tyler Ochoa MRN: [id number] DOB: 04/04/1951 Today's Date: 10/19/2024   History of Present Illness   Pt is a 73 y/o M admitted on 10/18/24 after presenting with RUE weakness & slurred speech that resolved. 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 PMH: CHF, cardiac amyloidosis, CKD, HLD, HTN, arthritis     Clinical Impressions Chart reviewed to date, pt greeted semi supine in bed, agreeable to OT evaluation. PTA pt reports he has PRN assist for dressing, bathing, toileting from girlfriend and PRN assist for IADLs. Pt reports he drives. He reports he transfers to [facility] and am 4-5' with RW on a good day. Typically he self propels mwc with feet around his apt. Pt presents with deficits in strength, endurance, activity tolerance, balance, affecting safe and optimal ADL completion. Bed mobility completed with supervision, STS with MIN A with RW. Pt unable to take side steps- he reports this is close to baseline when his knees and ankles hurt. MOD I for lateral scoots up the bed. Pt is left semi supine in bed, all needs met. OT will follow acutely to facilitate optimal ADL/functional mobility performance.      If plan is discharge home, recommend the following:   A little help with walking and/or transfers;A little help with bathing/dressing/bathroom;Assist for transportation;Help with stairs or ramp for entrance     Functional Status Assessment   Patient has had a recent decline in their functional status and demonstrates the ability to make significant improvements in function in a reasonable and predictable amount of time.     Equipment Recommendations   None recommended by OT;Other (comment) (pt has recommended equipment)     Recommendations for  Other Services         Precautions/Restrictions   Precautions Precautions: Fall Recall of Precautions/Restrictions: Intact Restrictions Weight Bearing Restrictions Per Provider Order: No     Mobility Bed Mobility Overal bed mobility: Needs Assistance Bed Mobility: Supine to Sit, Sit to Supine     Supine to sit: Supervision, HOB elevated, Used rails Sit to supine: Supervision, Used rails        Transfers Overall transfer level: Needs assistance Equipment used: Rolling walker (2 wheels) Transfers: Sit to/from Stand Sit to Stand: Contact guard assist, Min assist           General transfer comment: attempted lateral steps up the bed, reports unable to do so with arthritic pain; Lateral scoots with MOD I      Balance Overall balance assessment: Needs assistance Sitting-balance support: Feet supported Sitting balance-Leahy Scale: Good     Standing balance support: Bilateral upper extremity supported, During functional activity, Reliant on assistive device for balance Standing balance-Leahy Scale: Poor                             ADL either performed or assessed with clinical judgement   ADL Overall ADL's : Needs assistance/impaired Eating/Feeding: Set up   Grooming: Set up;Sitting               Lower Body Dressing: Maximal assistance                       Vision Patient Visual Report: No change from baseline       Perception  Praxis         Pertinent Vitals/Pain Pain Assessment Pain Assessment: Faces Faces Pain Scale: Hurts whole lot Pain Location: knees, ankles Pain Descriptors / Indicators: Discomfort, Sore Pain Intervention(s): Repositioned, Monitored during session, Limited activity within patient's tolerance     Extremity/Trunk Assessment Upper Extremity Assessment Upper Extremity Assessment: Right hand dominant;RUE deficits/detail (LUE grossly WFL) RUE Deficits / Details: midly weak grip strenght, pt  reports a tendon problem with his R thumb affecting grip strength at baseline; sensation appears intact RUE Coordination: decreased fine motor   Lower Extremity Assessment Lower Extremity Assessment: Defer to PT evaluation RLE Deficits / Details: 2+/5 R knee extension       Communication Communication Communication: No apparent difficulties   Cognition Arousal: Alert Behavior During Therapy: WFL for tasks assessed/performed Cognition: Cognition impaired           Executive functioning impairment (select all impairments): Problem solving                   Following commands: Intact       Cueing  General Comments   Cueing Techniques: Verbal cues  vss   Exercises Other Exercises Other Exercises: edu re role of OT, role of rehab, discharge recommendations   Shoulder Instructions      Home Living Family/patient expects to be discharged to:: Private residence Living Arrangements: Spouse/significant other (girlfriend) Available Help at Discharge: Family;Available 24 hours/day Type of Home: Apartment Home Access: Level entry     Home Layout: One level     Bathroom Shower/Tub: Producer, Television/film/video: Standard     Home Equipment: Teacher, English As A Foreign Language (2 wheels);Cane - single point;Wheelchair - manual;Hospital bed          Prior Functioning/Environment Prior Level of Function : Needs assist;Driving       Physical Assist : ADLs (physical)   ADLs (physical): Dressing Mobility Comments: transfers to w/c at baseline, propels w/c with BLE ADLs Comments: generally MOD I with feeding, grooming, PRN assist for toileting, dressing, bathing from girlfriend; pt reports he drives but girlfriend assists with IADLs as needed    OT Problem List: Decreased strength;Decreased activity tolerance;Impaired balance (sitting and/or standing);Decreased safety awareness;Decreased cognition;Decreased coordination;Decreased knowledge of use of DME or AE   OT  Treatment/Interventions: Self-care/ADL training;Therapeutic exercise;Energy conservation;DME and/or AE instruction;Patient/family education;Therapeutic activities;Balance training      OT Goals(Current goals can be found in the care plan section)   Acute Rehab OT Goals Patient Stated Goal: go home OT Goal Formulation: With patient Time For Goal Achievement: 11/02/24 Potential to Achieve Goals: Good ADL Goals Pt Will Perform Upper Body Bathing: sitting;with modified independence Pt Will Perform Lower Body Dressing: with modified independence;sitting/lateral leans Pt Will Transfer to Toilet: with modified independence;stand pivot transfer Pt Will Perform Toileting - Clothing Manipulation and hygiene: with modified independence;sitting/lateral leans   OT Frequency:  Min 2X/week    Co-evaluation              AM-PAC OT 6 Clicks Daily Activity     Outcome Measure Help from another person eating meals?: None Help from another person taking care of personal grooming?: None Help from another person toileting, which includes using toliet, bedpan, or urinal?: A Little Help from another person bathing (including washing, rinsing, drying)?: A Little Help from another person to put on and taking off regular upper body clothing?: None Help from another person to put on and taking off regular lower body clothing?: A Lot 6 Click Score:  20   End of Session Equipment Utilized During Treatment: Rolling walker (2 wheels) Nurse Communication: Mobility status  Activity Tolerance: Patient tolerated treatment well Patient left: in bed;with call bell/phone within reach  OT Visit Diagnosis: Other abnormalities of gait and mobility (R26.89);Muscle weakness (generalized) (M62.81)                Time: 9150-9091 OT Time Calculation (min): 19 min Charges:  OT General Charges $OT Visit: 1 Visit OT Evaluation $OT Eval Moderate Complexity: 1 Mod  Therisa Sheffield, OTD OTR/L  10/19/2024, 10:47 AM

## 2024-10-19 NOTE — Consult Note (Signed)
 Cardiology Consultation:   Patient ID: Tyler Ochoa; [id number]; [birth year]   Admit date: 10/18/2024 Date of Consult: 10/19/2024  Primary Care Provider: [facility] Primary Cardiologist: Bensimhon Primary Electrophysiologist:  None   Patient Profile:   Tyler Ochoa is a 73 y.o. male with a hx of CAD medically managed, HFrEF secondary to NICM, cardiac amyloidosis, CVA, CKD stage IIIb-IV, renal mass, pulmonary nodule, HTN, HLD, and liver cirrhosis who was admitted on 10/18/2024 with CVA and is being seen today for the evaluation of newly diagnosed atrial flutter at the request of Dr. Roann.  History of Present Illness:   Tyler Ochoa is followed by the heart failure service.  Echo in 01/2021 demonstrated an EF of 25 to 30%, global hypokinesis, severe concentric LVH, indeterminate LV diastolic function parameters, severely reduced RV systolic function with a mildly enlarged RV cavity size and mildly increased RV wall thickness, severe biatrial enlargement, small pericardial effusion noted posterior and lateral to the left ventricle, mild mitral regurgitation, and a mildly dilated aortic root measuring 41 mm.  Echo in 08/2021 showed an EF less than 20%, moderately reduced RV systolic function, and mild mitral regurgitation.  R/LHC in 09/2021 showed multivessel CAD including CTO of the mid LAD as well as moderate LCx and RCA disease of up to 70% with upper normal to mildly elevated left and right heart filling pressures, mild to moderate pulmonary pretension, and mildly reduced cardiac output/index.  Optimization of heart failure was recommended with consideration of viability study to determine if coronary revascularization may be beneficial.   He presented to [facility] on 10/18/2024 with right upper extremity weakness and numbness that started on 12/9 with symptom resolution in the morning on 12/10.,  He became concerned and presented to the ED the day after and was found to  have a right-sided CVA with possible small surrounding subarachnoid/petechial hemorrhage.  He was noted to be in new onset atrial flutter.  BP stable in the low 100s mmHg systolic with oxygen saturation 98% on room air.  Afebrile.  Labs: BUN 58, serum creatinine 2.26 consistent with prior readings, potassium 4.8, alkaline phosphatase 159, Hgb 14.5, PLT 466.  He was evaluated by neurology and not a candidate for TNK thrombectomy due to outside time window and resolution of symptoms.  Carotid artery ultrasound showed 0 to 49% stenosis of the bilateral ICAs.  MRI of the brain and showed an acute/subacute posterior right MCA territory infarct with associated petechial hemorrhage as well as small acute or subacute infarct in the left corona radiata and remote left larger than right cerebellar infarcts.  MRA of the head without contrast was suggestive of occlusion or severe stenosis at the right V3 and intradural vertebral artery.  At time of cardiology consult, he remains in atrial flutter with ventricular rates in the 70s to 80s bpm.  He is unaware of any palpitations.  Never with chest pain or dyspnea.  Right upper extremity paresthesias and weakness have resolved.  No lower extremity swelling, abdominal distention, or progressive orthopnea.    Past Medical History:  Diagnosis Date   CHF (congestive heart failure) (HCC)    Chronic kidney disease    Hyperlipidemia    Hypertension    Peripheral edema     Past Surgical History:  Procedure Laterality Date   RIGHT/LEFT HEART CATH AND CORONARY ANGIOGRAPHY N/A 09/08/2021   Procedure: RIGHT/LEFT HEART CATH AND CORONARY ANGIOGRAPHY;  Surgeon: Mady Bruckner, MD;  Location: [facility] INVASIVE CV LAB;  Service: Cardiovascular;  Laterality: N/A;     Home Meds: Prior to Admission medications  Medication Sig Start Date End Date Taking? Authorizing Provider  allopurinol  200 MG TABS Take 200 mg by mouth daily. 01/31/24  Yes Bensimhon, Toribio SAUNDERS, MD  aspirin  EC 81 MG  tablet Take 81 mg by mouth daily. Swallow whole.   Yes [provider]  carvedilol  (COREG ) 3.125 MG tablet TAKE 1 TABLET BY MOUTH TWICE DAILY WITH A MEAL 08/31/24  Yes Williams, [place], FNP  dapagliflozin  propanediol (FARXIGA ) 10 MG TABS tablet Take 1 tablet (10 mg total) by mouth daily before breakfast. 08/24/24  Yes Bensimhon, Toribio SAUNDERS, MD  sacubitril -valsartan  (ENTRESTO ) 24-26 MG Take 1 tablet by mouth 2 (two) times daily. 12/21/23  Yes Bensimhon, Toribio SAUNDERS, MD  spironolactone  (ALDACTONE ) 25 MG tablet Take 1/2 (one-half) tablet by mouth once daily 09/10/24  Yes Mil[place], [place], FNP  Tafamidis  (VYNDAMAX ) 61 MG CAPS Take 1 capsule by mouth daily. 12/21/23  Yes Bensimhon, Toribio SAUNDERS, MD  torsemide  (DEMADEX ) 20 MG tablet Take 2 tablets by mouth once daily 08/23/24  Yes [place], [place], FNP  PROAIR  HFA 108 (90 Base) MCG/ACT inhaler Inhale 1-2 puffs into the lungs every 6 (six) hours as needed for wheezing. Patient not taking: Reported on 10/18/2024    [provider]    Inpatient Medications: Scheduled Meds:   stroke: early stages of recovery book   Does not apply Once   allopurinol   200 mg Oral Daily   carvedilol   3.125 mg Oral BID WC   sacubitril -valsartan   1 tablet Oral BID   spironolactone   12.5 mg Oral Daily   torsemide   40 mg Oral Daily   Continuous Infusions:  sodium chloride  40 mL/hr at 10/19/24 0246   PRN Meds: acetaminophen  **OR** acetaminophen  (TYLENOL ) oral liquid 160 mg/5 mL **OR** acetaminophen , senna-docusate  Allergies:  Allergies[1]  Social History:   Social History   Socioeconomic History   Marital status: Single    Spouse name: Not on file   Number of children: Not on file   Years of education: Not on file   Highest education level: Not on file  Occupational History   Not on file  Tobacco Use   Smoking status: Never   Smokeless tobacco: Never  Vaping Use   Vaping status: Never Used  Substance and Sexual Activity   Alcohol use: Not on  file   Drug use: Not on file   Sexual activity: Not on file  Other Topics Concern   Not on file  Social History Narrative   ** Merged History Encounter **       Social Drivers of Health   Tobacco Use: Low Risk (10/18/2024)   Patient History    Smoking Tobacco Use: Never    Smokeless Tobacco Use: Never    Passive Exposure: Not on file  Financial Resource Strain: Not on file  Food Insecurity: Not on file  Transportation Needs: Not on file  Physical Activity: Not on file  Stress: Not on file  Social Connections: Not on file  Intimate Partner Violence: Not on file  Depression (PHQ2-9): Low Risk (11/05/2021)   Depression (PHQ2-9)    PHQ-2 Score: 0  Alcohol Screen: Not on file  Housing: Not on file  Utilities: Not on file  Health Literacy: Not on file     Family History:   Family History  Problem Relation Age of Onset   Hypertension Mother    Diabetes Mother    Throat cancer Father  ROS:  Review of Systems  Constitutional:  Negative for chills, diaphoresis, fever, malaise/fatigue and weight loss.  HENT:  Negative for congestion.   Eyes:  Negative for discharge and redness.  Respiratory:  Negative for cough, sputum production, shortness of breath and wheezing.   Cardiovascular:  Negative for chest pain, palpitations, orthopnea, claudication, leg swelling and PND.  Gastrointestinal:  Negative for abdominal pain, blood in stool, heartburn, melena, nausea and vomiting.  Musculoskeletal:  Negative for falls and myalgias.  Skin:  Negative for rash.  Neurological:  Positive for tingling, sensory change and focal weakness. Negative for dizziness, tremors, speech change, loss of consciousness and weakness.  Endo/Heme/Allergies:  Does not bruise/bleed easily.  Psychiatric/Behavioral:  Negative for substance abuse. The patient is not nervous/anxious.   All other systems reviewed and are negative.     Physical Exam/Data:   Vitals:   10/19/24 0341 10/19/24 0400 10/19/24  0600 10/19/24 0749  BP:  113/77 (!) 108/95   Pulse:  68 83   Resp:  20 20   Temp: 97.9 F (36.6 C)   97.6 F (36.4 C)  TempSrc: Oral   Oral  SpO2:  96% 98%   Weight:      Height:        Intake/Output Summary (Last 24 hours) at 10/19/2024 1007 Last data filed at 10/19/2024 0509 Gross per 24 hour  Intake 340.67 ml  Output 600 ml  Net -259.33 ml   Filed Weights   10/18/24 1242  Weight: 112 kg   Body mass index is 30.87 kg/m.   Physical Exam: General: Well developed, well nourished, in no acute distress. Head: Normocephalic, atraumatic, sclera non-icteric, no xanthomas, nares without discharge.  Neck: Negative for carotid bruits. JVD not elevated. Lungs: Clear bilaterally to auscultation without wheezes, rales, or rhonchi. Breathing is unlabored. Heart: Irregularly irregular with S1 S2. No murmurs, rubs, or gallops appreciated. Abdomen: Soft, non-tender, non-distended with normoactive bowel sounds. No hepatomegaly. No rebound/guarding. No obvious abdominal masses. Msk:  Strength and tone appear normal for age. Extremities: No clubbing or cyanosis. No edema. Distal pedal pulses are 2+ and equal bilaterally. Neuro: Alert and oriented X 3. No facial asymmetry. No focal deficit. Moves all extremities spontaneously. Psych:  Responds to questions appropriately with a normal affect.   EKG:  The EKG was personally reviewed and demonstrates: Atrial flutter with variable AV block, 97 bpm, RBBB, nonspecific ST-T changes Telemetry:  Telemetry was personally reviewed and demonstrates: Atrial flutter with variable AV block with ventricular rates in the 70s to 80s bpm  Weights: Filed Weights   10/18/24 1242  Weight: 112 kg    Relevant CV Studies:  2D echo 09/09/2023: 1. Strain pattern (apical sparing) is consistent with cardiac  amyloidosis. Left ventricular ejection fraction, by estimation, is 25 to  30%. The left ventricle has severely decreased function. The left  ventricle  demonstrates global hypokinesis. There is  moderate concentric left ventricular hypertrophy. Left ventricular  diastolic parameters are consistent with Grade II diastolic dysfunction  (pseudonormalization). Elevated left atrial pressure. The average left  ventricular global longitudinal strain is  -7.6 %. The global longitudinal strain is abnormal.   2. Right ventricular systolic function is moderately reduced. The right  ventricular size is normal. Mildly increased right ventricular wall  thickness. There is mildly elevated pulmonary artery systolic pressure.  The estimated right ventricular systolic  pressure is 43.7 mmHg.   3. Left atrial size was moderately dilated.   4. Right atrial size was moderately dilated.  5. The mitral valve is normal in structure. Mild mitral valve  regurgitation.   6. The aortic valve is tricuspid. Aortic valve regurgitation is not  visualized. No aortic stenosis is present.   Comparison(s): Prior images reviewed side by side. The left ventricular  function is worsened. Left ventricular systolic function appears slightly  worse.  __________  2D echo 08/06/2022: 1. Left ventricular ejection fraction, by estimation, is 30 to 35%. The  left ventricle has moderately decreased function. The left ventricle has  no regional wall motion abnormalities. There is severe concentric left  ventricular hypertrophy. Left  ventricular diastolic parameters are consistent with Grade III diastolic  dysfunction (restrictive).   2. Right ventricular systolic function is moderately reduced. The right  ventricular size is normal. Moderately increased right ventricular wall  thickness. There is normal pulmonary artery systolic pressure.   3. Left atrial size was moderately dilated.   4. Right atrial size was moderately dilated.   5. The mitral valve is normal in structure. Mild mitral valve  regurgitation. No evidence of mitral stenosis.   6. The aortic valve is normal in  structure. Aortic valve regurgitation is  not visualized. No aortic stenosis is present.   7. The inferior vena cava is normal in size with greater than 50%  respiratory variability, suggesting right atrial pressure of 3 mmHg.   8. GLS pattern consistent with apical sparing cherry on top.   9. Findings c/w cardiac amyloidosis  __________  2D echo 02/01/2022: 1. Left ventricular ejection fraction, by estimation, is 30 to 35%. Left  ventricular ejection fraction by 3D volume is 35 %. The left ventricle has  moderately decreased function. The left ventricle demonstrates global  hypokinesis. There is mild  concentric left ventricular hypertrophy and severe posterior hypertrophy.  Left ventricular diastolic function could not be evaluated. There is a  speckeled appearance to the LV myocardium.   2. Right ventricular systolic function is moderately reduced. The right  ventricular size is normal. There is normal pulmonary artery systolic  pressure. The estimated right ventricular systolic pressure is 29.7 mmHg.   3. Left atrial size was moderately dilated.   4. Right atrial size was severely dilated.   5. The pericardial effusion is circumferential.   6. The mitral valve is normal in structure. Mild mitral valve  regurgitation. No evidence of mitral stenosis.   7. The aortic valve is tricuspid. Aortic valve regurgitation is not  visualized. Aortic valve sclerosis/calcification is present, without any  evidence of aortic stenosis.   8. The inferior vena cava is dilated in size with >50% respiratory  variability, suggesting right atrial pressure of 8 mmHg.   9. Based on severe posterior LV hypertrophy, speckled appearance of the  LV myocardium, significant biatrial enlargement, biventricular failure and  pericardial effusion, a diagnosis of cardiac amyloidosis should be  considered.  __________  Cardiac MRI 11/03/2021: IMPRESSION: 1.  Moderate right, small left pleural effusions. 2.   Small, primarily inferior pericardial effusion. 3. Mildly dilated LV with moderate concentric LVH, global severe hypokinesis with EF 12%. 4.  Moderately dilated RV with severe systolic dysfunction, EF 18%. 5. Very extensive LGE throughout the left ventricular myocardium and patchy LGE in the RV, this appears most consistent with cardiac amyloidosis. 6. Elevated T1 and ECV percentage, this is most consistent with cardiac amyloidosis. __________  [place] 09/08/2021: Conclusions: Multivessel coronary artery disease, as detailed below, including chronic total occlusion of the mid LAD, as well as moderate LCx and RCA disease of  up to 70%. Upper normal to mildly elevated left and right heart filling pressures. Mild to moderate pulmonary hypertension. Mildly reduced Fick cardiac output/index.   Recommendations: Discontinue furosemide  infusion; anticipate transitioning to oral diuretic regimen tomorrow. Escalate goal-directed medical therapy as tolerated. Once optimized from heart failure standpoint, consider viability study to determine if coronary revascularization may be beneficial. Secondary prevention of coronary artery disease. __________  2D echo 08/29/2021: 1. Left ventricular ejection fraction, by estimation, is <20%. The left  ventricle has severely decreased function. The left ventricle demonstrates  global hypokinesis. There is mild concentric left ventricular hypertrophy.  Left ventricular diastolic  parameters are consistent with Grade III diastolic dysfunction  (restrictive).   2. Right ventricular systolic function is moderately reduced. The right  ventricular size is mildly enlarged.   3. Left atrial size was mildly dilated.   4. Right atrial size was moderately dilated.   5. The mitral valve is normal in structure. Mild mitral valve  regurgitation.   6. The aortic valve is normal in structure. Aortic valve regurgitation is  not visualized. No aortic stenosis is present.    7. The inferior vena cava is dilated in size with <50% respiratory  variability, suggesting right atrial pressure of 15 mmHg.  __________  2D echo 01/07/2021: 1. Left ventricular ejection fraction, by estimation, is 25 to 30%. The  left ventricle has severely decreased function. The left ventricle  demonstrates global hypokinesis. There is severe concentric left  ventricular hypertrophy. Left ventricular  diastolic parameters are indeterminate.   2. Right ventricular systolic function is severely reduced. The right  ventricular size is mildly enlarged. Mildly increased right ventricular  wall thickness.   3. Left atrial size was severely dilated.   4. Right atrial size was severely dilated.   5. A small pericardial effusion is present. The pericardial effusion is  posterior and lateral to the left ventricle.   6. The mitral valve is normal in structure. Mild mitral valve  regurgitation.   7. The aortic valve is tricuspid. Aortic valve regurgitation is not  visualized.   8. Aortic dilatation noted. There is mild dilatation of the aortic root,  measuring 41 mm.   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 consider CMR.      Laboratory Data:  Chemistry Recent Labs  Lab 10/18/24 1248  NA 132*  K 4.8  CL 92*  CO2 23  GLUCOSE 107*  BUN 58*  CREATININE 2.26*  CALCIUM  9.7  GFRNONAA 30*  ANIONGAP 16*    Recent Labs  Lab 10/18/24 1248  PROT 8.1  ALBUMIN 3.5  AST 19  ALT 6  ALKPHOS 159*  BILITOT 0.5   Hematology Recent Labs  Lab 10/18/24 1248  WBC 7.0  RBC 5.01  HGB 14.5  HCT 44.2  MCV 88.2  MCH 28.9  MCHC 32.8  RDW 14.2  PLT 466*   Cardiac EnzymesNo results for input(s): TROPONINI in the last 168 hours. No results for input(s): TROPIPOC in the last 168 hours.  BNPNo results for input(s):  BNP, PROBNP in the last 168 hours.  DDimer No results for input(s): DDIMER in the last 168 hours.  Radiology/Studies:  MR ANGIO HEAD WO CONTRAST Result Date: 10/18/2024 IMPRESSION: 1. No flow related signal in the right V3 and intradural vertebral artery suggesting occlusion or severe stenosis. A CTA or MRA neck could further characterize. 2. Otherwise, no significant stenosis. Electronically signed  by: Gilmore Molt 10/18/2024 09:15 PM EST RP Workstation: [id number]   MR BRAIN WO CONTRAST Result Date: 10/18/2024 IMPRESSION: 1. Acute or subacute posterior right MCA territory infarcts with associated petechial hemorrhage. 2. Small acute or subacute infarct in the left corona radiata. 3. Remote left larger than right cerebellar infarcts. Electronically signed by: Gilmore Molt 10/18/2024 09:05 PM EST RP Workstation: [id number]   US  Carotid Bilateral Result Date: 10/18/2024 IMPRESSION: 1. Estimated 0-49% stenosis within the bilateral internal carotid arteries. Electronically Signed   By: Ozell Daring M.D.   On: 10/18/2024 19:20   CT HEAD WO CONTRAST Result Date: 10/18/2024 IMPRESSION: 1. Mild low-attenuation over the gray-white matter over the right posterior temporal/parietal region with mild effacement of the sulci. This likely represents patient's known recent infarct. Possible minimal acute subarachnoid hemorrhage/petechial hemorrhage over the sulci in this region. 2. 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 Critical Value/emergent results were called by telephone at the time of interpretation on 10/18/2024 at 2:58 pm to provider Ozell Klein, who verbally acknowledged these results. Electronically Signed   By: Toribio Agreste M.D.   On: 10/18/2024 14:59    Assessment and Plan:   1. New onset atrial flutter:  - Unclear chronicity - Ventricular rates currently well-controlled - Recommend initiation of anticoagulation once cleared by neurology  given recent CVA with petechial hemorrhage noted on MRI - Unable to pursue rhythm control strategy at this time given acute CVA not currently on anticoagulation - If patient either declines anticoagulation, or is felt to be not a anticoagulation candidate per neurology, would recommend EP evaluation for consideration of watchman, though this would require short-term anticoagulation and antiplatelet therapy  - CHA2DS2-VASc at least 6 (CHF, HTN, age x 1, CVA x 2, vascular disease)  - Echo pending  - PTA carvedilol  - Potassium at goal - Check magnesium  and TSH  2. Acute ischemic stroke complicated by possible subarachnoid/petechial hemorrhage and cerebrovascular disease:  - Neurology has recommended initiation of aspirin  today - Recommend initiation of anticoagulation given documented atrial flutter of uncertain chronicity once cleared by neurology - Has not been maintained on statin due to elevated LFTs, consider PCSK9 inhibitor  - Ongoing management per neurology   3. HFrEF with underlying cardiac amyloidosis:  - Euvolemic - Continue current GDMT including Coreg  3.125 mg bid, Entresto  24/26 mg bid, and torsemide  40 mg daily  - With progressive renal dysfunction and prior hyperkalemia, will hold spironolactone , recommend this be revisited with heart failure service in the outpatient setting - Recommend patient bring his supply of tafamidis  to take while admitted - Relative hypotension precludes escalation of GDMT  - In the outpatient setting on tamfamidis  - Follow up with heart failure service   4. CAD:  - Never with symptoms of angina  - Medically managed  - Aspirin  held per neurology, resume when able   5. CKD stage IIIb-IV:  - Avoid nephrotoxic agents  - Holding spironolactone  as above  6. HTN:  - Blood pressure well-controlled  - Pharmacotherapy as above   7. HLD:  - LDL 141  - Target LDL < 70  - Not on statin given history of elevated LFTs  - Consider PCSK9 inhibitor in  the outpatient setting, will defer to outpatient team     For questions or updates, please contact [facility] Please consult [email or web address] for contact info under Cardiology/STEMI.   Signed, Bernardino Bring, PA-C [facility] Pager: [phone] 10/19/2024, 10:07 AM       [  1] No Known Allergies

## 2024-10-19 NOTE — Discharge Summary (Signed)
 Physician Discharge Summary   Patient: Tyler Ochoa MRN: [id number] DOB: Jun 04, 1951  Admit date:     10/18/2024  Discharge date: 10/19/2024  Discharge Physician: Nena Rebel   PCP: [facility], [facility]   Recommendations at discharge:   Patient signed out AMA and did not want to wait for the instruction  Discharge Diagnoses: Principal Problem:   Acute stroke due to ischemia [facility]) Active Problems:   Essential hypertension   Hyperlipidemia   HFrEF (heart failure with reduced ejection fraction) (HCC)   Coronary artery disease involving native coronary artery of native heart without angina pectoris  Resolved Problems:   * No resolved hospital problems. [facility] Course:  Right upper extremity weakness and slurred speech resolved within 24 hours   HPI: Tyler Ochoa is a pleasant 73 y.o. male with medical history significant for CHF, cardiac amyloidosis, CKD, HLD, HTN, arthritis who presented to ED at [facility] complaining of acute onset of right upper extremity numbness and weakness started 9 PM 10/16/2024 on Tuesday, 2 days Thursday 2 days ago.  He stated that his symptoms resolved by the morning but he was concerned and decided to come to emergency room today.  He feels like he is at his baseline.  He takes aspirin  daily at home.  Patient denies any trauma, change in mental status, headache, nausea, vomiting, gait problem.  However he had some slurred speech for transient period of time.  He does not remember having similar symptoms in the past.   ED Course: Upon arrival to the ED, patient is found to be tachycardic in 118 with some flutter, EKG showed a flutter with variable block at 97 bpm, no ST elevation, CT scan of the brain showed right-sided CVA and possible small surrounding subarachnoid/petechial hemorrhages.  Neurology was consulted who advised to do the stroke workup but no antiplatelet agents.  Hospitalist service was consulted for evaluation for admission for  stroke workup  Patient was worked up for stroke which did not show any worsening.  MRI showed ischemic stroke with some punctate hemorrhage.  Neurologist seen the patient and advised for aspirin .  Cardiology seen the patient and they wanted to start anticoagulation after talking to neurologist.  Neurologist advised to start anything after a week or so due to abnormal coags with elevated INR and PTT.  I went to see the patient and explained everything but he did not have time to wait for the discharge  completion.  He did not want to wait for medications.  He left AGAINST MEDICAL ADVICE.  Assessment and Plan: 73 year old male double/PMH of HFrEF last EF was 25 to 30% in November 2024, cardiac, lipidosis, HTN, HLD, CAD s/p MI, liver cirrhosis who came into ED complaining of tingling and numbness of right arm and face and slurred speech 2 days ago which resolved within 24 hours.   1.  Acute ischemic stroke with some concerning hemorrhage/petechiae - Neurology seen the patient and advised for aspirin .  His coags were abnormal. - Cardiology evaluated the patient  2.  History of HFrEF with last EF 25 to 30% - Will continue his home medications torsemide , Entresto  and spironolactone  - Resumed home medication   3.  Concerning atrial flutter on EKG - Patient has a known cardiac history including CAD, CHF, amyloidosis - Cardiology evaluated the patient, advised that he may need anticoagulation.  But neurology said at least we will have to wait a week or so before restarting anticoagulation.  4.  History of CAD -  Patient is on aspirin , Coreg , which will be continued   5.  History of gout - Patient is on allopurinol  which will be continued   6.  CKD - His creatinine is around 2 most of the time. - This time it is slightly high at 2.6. - Will continue to monitor kidney function  Patient signed out AMA saying that he feels better and he does not want to wait in the hospital.     Pain control -  [place]  Controlled Substance Reporting System database was reviewed. and patient was instructed, not to drive, operate heavy machinery, perform activities at heights, swimming or participation in water activities or provide baby-sitting services while on Pain, Sleep and Anxiety Medications; until their outpatient Physician has advised to do so again. Also recommended to not to take more than prescribed Pain, Sleep and Anxiety Medications.  Consultants: Neurology/cardiology Procedures performed:  The brain echocardiogram Disposition: Home signed out AMA Diet recommendation:  Cardiac diet DISCHARGE MEDICATION: Patient signed out AMA before completing discharge medications  Discharge Exam: Filed Weights   10/18/24 1242  Weight: 112 kg   Patient was seen and examined at bedside. HEENT: neck supple General: Alert awake and oriented Neurology: No focal neurodeficit identified Cardiovascular: Regular S1-S2 Pulmonary: No rales no rhonchi bilateral decreased air entry at the bases Abdomen: Soft bowel sounds are present Extremities: No edema  Condition at discharge: fair  The results of significant diagnostics from this hospitalization (including imaging, microbiology, ancillary and laboratory) are listed below for reference.   Imaging Studies: ECHOCARDIOGRAM COMPLETE Result Date: 10/19/2024    ECHOCARDIOGRAM REPORT   Patient Name:   Tyler Ochoa Date of Exam: 10/19/2024 Medical Rec #:  [id number]        Height:       75.0 in Accession #:    [id number]       Weight:       247.0 lb Date of Birth:  [birth year]         BSA:          2.400 m Patient Age:    73 years         BP:           108/95 mmHg Patient Gender: M                HR:           83 bpm. Exam Location:  [facility] Procedure: 2D Echo, 3D Echo, Saline Contrast Bubble Study, Color Doppler,            Cardiac Doppler and Intracardiac Opacification Agent (Both Spectral            and Color Flow Doppler were utilized during procedure).  Indications:     Stroke I63.9  History:         Patient has prior history of Echocardiogram examinations, most                  recent 09/09/2023. CHF; Risk Factors:Hypertension and                  Dyslipidemia.  Sonographer:     Christopher Furnace Referring Phys:  [id number] [facility] [place] Diagnosing Phys: Redell Cave MD  Sonographer Comments: Suboptimal apical window. Global longitudinal strain was attempted. IMPRESSIONS  1. Left ventricular ejection fraction, by estimation, is 30 to 35%. The left ventricle has moderate to severely decreased function. The left ventricle demonstrates global hypokinesis. There is severe concentric left ventricular hypertrophy. Left  ventricular diastolic parameters are indeterminate.  2. Right ventricular systolic function is low normal. The right ventricular size is normal.  3. Left atrial size was moderately dilated.  4. Right atrial size was severely dilated.  5. The mitral valve is normal in structure. No evidence of mitral valve regurgitation.  6. The aortic valve is grossly normal. Aortic valve regurgitation is not visualized.  7. Agitated saline contrast bubble study was negative, with no evidence of any interatrial shunt. FINDINGS  Left Ventricle: Left ventricular ejection fraction, by estimation, is 30 to 35%. The left ventricle has moderate to severely decreased function. The left ventricle demonstrates global hypokinesis. Definity  contrast agent was given IV to delineate the left ventricular endocardial borders. Global longitudinal strain performed but not reported based on interpreter judgement due to suboptimal tracking. The left ventricular internal cavity size was normal in size. There is severe concentric left ventricular hypertrophy. Left ventricular diastolic parameters are indeterminate. Right Ventricle: The right ventricular size is normal. No increase in right ventricular wall thickness. Right ventricular systolic function is low normal. Left Atrium: Left atrial size  was moderately dilated. Right Atrium: Right atrial size was severely dilated. Pericardium: Trivial pericardial effusion is present. Mitral Valve: The mitral valve is normal in structure. No evidence of mitral valve regurgitation. MV peak gradient, 2.1 mmHg. The mean mitral valve gradient is 1.0 mmHg. Tricuspid Valve: The tricuspid valve is normal in structure. Tricuspid valve regurgitation is trivial. Aortic Valve: The aortic valve is grossly normal. Aortic valve regurgitation is not visualized. Aortic valve mean gradient measures 2.0 mmHg. Aortic valve peak gradient measures 3.0 mmHg. Aortic valve area, by VTI measures 2.41 cm. Pulmonic Valve: The pulmonic valve was normal in structure. Pulmonic valve regurgitation is not visualized. Aorta: The aortic root is normal in size and structure. IAS/Shunts: No atrial level shunt detected by color flow Doppler. Agitated saline contrast was given intravenously to evaluate for intracardiac shunting. Agitated saline contrast bubble study was negative, with no evidence of any interatrial shunt.  LEFT VENTRICLE PLAX 2D LVIDd:         4.70 cm LVIDs:         4.20 cm LV PW:         1.80 cm LV IVS:        2.00 cm LVOT diam:     2.20 cm LV SV:         36 LV SV Index:   15 LVOT Area:     3.80 cm LV IVRT:       90 msec  RIGHT VENTRICLE RV Basal diam:  4.20 cm RV Mid diam:    3.90 cm RV S prime:     17.80 cm/s TAPSE (M-mode): 1.7 cm LEFT ATRIUM              Index        RIGHT ATRIUM           Index LA diam:        2.75 cm  1.15 cm/m   RA Area:     33.10 cm LA Vol (A2C):   124.0 ml 51.66 ml/m  RA Volume:   129.00 ml 53.74 ml/m LA Vol (A4C):   85.2 ml  35.50 ml/m LA Biplane Vol: 106.0 ml 44.16 ml/m  AORTIC VALVE AV Area (Vmax):    2.53 cm AV Area (Vmean):   2.41 cm AV Area (VTI):     2.41 cm AV Vmax:  86.10 cm/s AV Vmean:          62.300 cm/s AV VTI:            0.148 m AV Peak Grad:      3.0 mmHg AV Mean Grad:      2.0 mmHg LVOT Vmax:         57.40 cm/s LVOT Vmean:         39.500 cm/s LVOT VTI:          0.094 m LVOT/AV VTI ratio: 0.63  AORTA Ao Root diam: 3.70 cm MITRAL VALVE               TRICUSPID VALVE MV Area (PHT): 3.40 cm    TR Peak grad:   5.6 mmHg MV Area VTI:   3.69 cm    TR Vmax:        118.00 cm/s MV Peak grad:  2.1 mmHg MV Mean grad:  1.0 mmHg    SHUNTS MV Vmax:       0.72 m/s    Systemic VTI:  0.09 m MV Vmean:      54.9 cm/s   Systemic Diam: 2.20 cm MV Decel Time: 223 msec MV E velocity: 74.10 cm/s Redell Cave MD Electronically signed by Redell Cave MD Signature Date/Time: 10/19/2024/1:05:27 PM    Final    MR ANGIO HEAD WO CONTRAST Result Date: 10/18/2024 EXAM: MR Angiography Head without intravenous contrast. 10/18/2024 08:43:29 PM TECHNIQUE: Magnetic resonance angiography images of the head without intravenous contrast. Multiplanar 2D and 3D reformatted images are provided for review. COMPARISON: None provided. CLINICAL HISTORY: Neuro deficit, acute, stroke suspected Neuro deficit, acute, stroke suspected FINDINGS: ANTERIOR CIRCULATION: No significant stenosis of the internal carotid arteries. No significant stenosis of the anterior cerebral arteries. No significant stenosis of the middle cerebral arteries. No aneurysm. POSTERIOR CIRCULATION: No significant stenosis of the posterior cerebral arteries. No significant stenosis of the basilar artery. No flow related signal in the right V3 and intradural vertebral artery suggesting occlusion or severe stenosis. No aneurysm. IMPRESSION: 1. No flow related signal in the right V3 and intradural vertebral artery suggesting occlusion or severe stenosis. A CTA or MRA neck could further characterize. 2. Otherwise, no significant stenosis. Electronically signed by: Gilmore Molt 10/18/2024 09:15 PM EST RP Workstation: [id number]   MR BRAIN WO CONTRAST Result Date: 10/18/2024 EXAM: MRI Brain Without Contrast 10/18/2024 08:43:29 PM TECHNIQUE: Multiplanar multisequence MRI of the head/brain was performed  without the administration of intravenous contrast. COMPARISON: CT head from earlier CLINICAL HISTORY: Neuro deficit, acute, stroke suspected FINDINGS: BRAIN AND VENTRICLES: Acute or subacute posterior right MCA territory infarcts involving the posterior right temporal lobe and parietal lobe. Associated petechial hemorrhage. Small acute or subacute infarct in the left corona radiata. Associated edema without mass effect. No midline shift. Remote left larger than right cerebellar infarcts. No mass occupying  intracranial hemorrhage. No mass. No hydrocephalus. The sella is unremarkable. Normal flow voids. ORBITS: No acute abnormality. SINUSES AND MASTOIDS: Opacified left sphenoid sinus and scattered mucosal thickening. BONES AND SOFT TISSUES: Normal marrow signal. IMPRESSION: 1. Acute or subacute posterior right MCA territory infarcts with associated petechial hemorrhage. 2. Small acute or subacute infarct in the left corona radiata. 3. Remote left larger than right cerebellar infarcts. Electronically signed by: Gilmore Molt 10/18/2024 09:05 PM EST RP Workstation: [id number]   US  Carotid Bilateral Result Date: 10/18/2024 CLINICAL DATA:  Hyperlipidemia, coronary artery disease, stroke symptoms EXAM: BILATERAL CAROTID DUPLEX ULTRASOUND TECHNIQUE: Elnor scale imaging, color Doppler  and duplex ultrasound were performed of bilateral carotid and vertebral arteries in the neck. COMPARISON:  01/12/2007 FINDINGS: Criteria: Quantification of carotid stenosis is based on velocity parameters that correlate the residual internal carotid diameter with NASCET-based stenosis levels, using the diameter of the distal internal carotid lumen as the denominator for stenosis measurement. The following velocity measurements were obtained: RIGHT ICA: 60/19 cm/sec CCA: 55/10 cm/sec SYSTOLIC ICA/CCA RATIO:  1.1 ECA: 44 cm/sec LEFT ICA: 62/21 cm/sec CCA: 57/15 cm/sec SYSTOLIC ICA/CCA RATIO:  1.1 ECA: 48 cm/sec RIGHT CAROTID ARTERY: Mild  irregular atheromatous plaque within the right carotid bulb and proximal right ICA. No significant stenosis by grayscale imaging. Normal arterial waveforms. RIGHT VERTEBRAL ARTERY:  Antegrade.  Normal arterial waveforms. LEFT CAROTID ARTERY: Mild atheromatous plaque within the left carotid bulb extending into the left ICA. No significant stenosis by grayscale imaging. Normal arterial waveforms. LEFT VERTEBRAL ARTERY:  Antegrade, normal arterial waveforms. IMPRESSION: 1. Estimated 0-49% stenosis within the bilateral internal carotid arteries. Electronically Signed   By: Ozell Daring M.D.   On: 10/18/2024 19:20   CT HEAD WO CONTRAST Result Date: 10/18/2024 CLINICAL DATA:  Follow-up stroke. EXAM: CT HEAD WITHOUT CONTRAST TECHNIQUE: Contiguous axial images were obtained from the base of the skull through the vertex without intravenous contrast. RADIATION DOSE REDUCTION: This exam was performed according to the departmental dose-optimization program which includes automated exposure control, adjustment of the mA and/or kV according to patient size and/or use of iterative reconstruction technique. COMPARISON:  None Available. FINDINGS: Brain: Ventricles and cisterns are normal. There is mild low-attenuation over the gray-white matter over the right posterior temporal/parietal region with mild effacement of the sulci. This likely represents patient's known recent stroke. Possible minimal acute subarachnoid hemorrhage/petechial hemorrhage over the sulci in this region. 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 Remainder of the exam is unremarkable. Vascular: No hyperdense vessel or unexpected calcification. Skull: Normal. Negative for fracture or focal lesion. Sinuses/Orbits: Orbits are normal symmetric. Paranasal sinuses are well developed and demonstrate minimal mucosal membrane thickening over the maxillary and ethmoid sinuses. There is moderate opacification with mucoperiosteal thickening involving the sphenoid  sinus. Other: None. IMPRESSION: 1. Mild low-attenuation over the gray-white matter over the right posterior temporal/parietal region with mild effacement of the sulci. This likely represents patient's known recent infarct. Possible minimal acute subarachnoid hemorrhage/petechial hemorrhage over the sulci in this region. 2. 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 Critical Value/emergent results were called by telephone at the time of interpretation on 10/18/2024 at 2:58 pm to provider Ozell Klein, who verbally acknowledged these results. Electronically Signed   By: Toribio Agreste M.D.   On: 10/18/2024 14:59    Microbiology: Results for orders placed or performed during the hospital encounter of 08/28/21  Resp Panel by RT-PCR (Flu A&B, Covid) Nasopharyngeal Swab     Status: None   Collection Time: 08/28/21  9:43 PM   Specimen: Nasopharyngeal Swab; Nasopharyngeal(NP) swabs in vial transport medium  Result Value Ref Range Status   SARS Coronavirus 2 by RT PCR NEGATIVE NEGATIVE Final    Comment: (NOTE) SARS-CoV-2 target nucleic acids are NOT DETECTED.  The SARS-CoV-2 RNA is generally detectable in upper respiratory specimens during the acute phase of infection. The lowest concentration of SARS-CoV-2 viral copies this assay can detect is 138 copies/mL. A negative result does not preclude SARS-Cov-2 infection and should not be used as the sole basis for treatment or other patient management decisions. A negative result may occur with  improper specimen  collection/handling, submission of specimen other than nasopharyngeal swab, presence of viral mutation(s) within the areas targeted by this assay, and inadequate number of viral copies(<138 copies/mL). A negative result must be combined with clinical observations, patient history, and epidemiological information. The expected result is Negative.  Fact Sheet for Patients:   bloggercourse.com  Fact Sheet for Healthcare Providers:  seriousbroker.it  This test is no t yet approved or cleared by the [place]  FDA and  has been authorized for detection and/or diagnosis of SARS-CoV-2 by FDA under an Emergency Use Authorization (EUA). This EUA will remain  in effect (meaning this test can be used) for the duration of the COVID-19 declaration under Section 564(b)(1) of the Act, 21 U.S.C.section 360bbb-3(b)(1), unless the authorization is terminated  or revoked sooner.       Influenza A by PCR NEGATIVE NEGATIVE Final   Influenza B by PCR NEGATIVE NEGATIVE Final    Comment: (NOTE) The Xpert Xpress SARS-CoV-2/FLU/RSV plus assay is intended as an aid in the diagnosis of influenza from Nasopharyngeal swab specimens and should not be used as a sole basis for treatment. Nasal washings and aspirates are unacceptable for Xpert Xpress SARS-CoV-2/FLU/RSV testing.  Fact Sheet for Patients: bloggercourse.com  Fact Sheet for Healthcare Providers: seriousbroker.it  This test is not yet approved or cleared by the [place]  FDA and has been authorized for detection and/or diagnosis of SARS-CoV-2 by FDA under an Emergency Use Authorization (EUA). This EUA will remain in effect (meaning this test can be used) for the duration of the COVID-19 declaration under Section 564(b)(1) of the Act, 21 U.S.C. section 360bbb-3(b)(1), unless the authorization is terminated or revoked.  Performed at [facility], [address] [place], [place] [address]   SARS CORONAVIRUS 2 (TAT 6-24 HRS) Nasopharyngeal Nasopharyngeal Swab     Status: None   Collection Time: 09/06/21 12:00 PM   Specimen: Nasopharyngeal Swab  Result Value Ref Range Status   SARS Coronavirus 2 NEGATIVE NEGATIVE Final    Comment: (NOTE) SARS-CoV-2 target nucleic acids are NOT DETECTED.  The  SARS-CoV-2 RNA is generally detectable in upper and lower respiratory specimens during the acute phase of infection. Negative results do not preclude SARS-CoV-2 infection, do not rule out co-infections with other pathogens, and should not be used as the sole basis for treatment or other patient management decisions. Negative results must be combined with clinical observations, patient history, and epidemiological information. The expected result is Negative.  Fact Sheet for Patients: hairslick.no  Fact Sheet for Healthcare Providers: quierodirigir.com  This test is not yet approved or cleared by the [place]  FDA and  has been authorized for detection and/or diagnosis of SARS-CoV-2 by FDA under an Emergency Use Authorization (EUA). This EUA will remain  in effect (meaning this test can be used) for the duration of the COVID-19 declaration under Se ction 564(b)(1) of the Act, 21 U.S.C. section 360bbb-3(b)(1), unless the authorization is terminated or revoked sooner.  Performed at [facility], [address], [place], [place] [address]   Resp Panel by RT-PCR (Flu A&B, Covid) Nasopharyngeal Swab     Status: None   Collection Time: 09/09/21 12:40 PM   Specimen: Nasopharyngeal Swab; Nasopharyngeal(NP) swabs in vial transport medium  Result Value Ref Range Status   SARS Coronavirus 2 by RT PCR NEGATIVE NEGATIVE Final    Comment: (NOTE) SARS-CoV-2 target nucleic acids are NOT DETECTED.  The SARS-CoV-2 RNA is generally detectable in upper respiratory specimens during the acute phase of infection.  The lowest concentration of SARS-CoV-2 viral copies this assay can detect is 138 copies/mL. A negative result does not preclude SARS-Cov-2 infection and should not be used as the sole basis for treatment or other patient management decisions. A negative result may occur with  improper specimen collection/handling, submission of  specimen other than nasopharyngeal swab, presence of viral mutation(s) within the areas targeted by this assay, and inadequate number of viral copies(<138 copies/mL). A negative result must be combined with clinical observations, patient history, and epidemiological information. The expected result is Negative.  Fact Sheet for Patients:  bloggercourse.com  Fact Sheet for Healthcare Providers:  seriousbroker.it  This test is no t yet approved or cleared by the [place]  FDA and  has been authorized for detection and/or diagnosis of SARS-CoV-2 by FDA under an Emergency Use Authorization (EUA). This EUA will remain  in effect (meaning this test can be used) for the duration of the COVID-19 declaration under Section 564(b)(1) of the Act, 21 U.S.C.section 360bbb-3(b)(1), unless the authorization is terminated  or revoked sooner.       Influenza A by PCR NEGATIVE NEGATIVE Final   Influenza B by PCR NEGATIVE NEGATIVE Final    Comment: (NOTE) The Xpert Xpress SARS-CoV-2/FLU/RSV plus assay is intended as an aid in the diagnosis of influenza from Nasopharyngeal swab specimens and should not be used as a sole basis for treatment. Nasal washings and aspirates are unacceptable for Xpert Xpress SARS-CoV-2/FLU/RSV testing.  Fact Sheet for Patients: bloggercourse.com  Fact Sheet for Healthcare Providers: seriousbroker.it  This test is not yet approved or cleared by the [place]  FDA and has been authorized for detection and/or diagnosis of SARS-CoV-2 by FDA under an Emergency Use Authorization (EUA). This EUA will remain in effect (meaning this test can be used) for the duration of the COVID-19 declaration under Section 564(b)(1) of the Act, 21 U.S.C. section 360bbb-3(b)(1), unless the authorization is terminated or revoked.  Performed at [facility], [address] [place], [place] [address]     Labs: CBC: Recent Labs  Lab 10/18/24 1248  WBC 7.0  NEUTROABS 4.6  HGB 14.5  HCT 44.2  MCV 88.2  PLT 466*   Basic Metabolic Panel: Recent Labs  Lab 10/18/24 1248  NA 132*  K 4.8  CL 92*  CO2 23  GLUCOSE 107*  BUN 58*  CREATININE 2.26*  CALCIUM  9.7   Liver Function Tests: Recent Labs  Lab 10/18/24 1248  AST 19  ALT 6  ALKPHOS 159*  BILITOT 0.5  PROT 8.1  ALBUMIN 3.5   CBG: Recent Labs  Lab 10/18/24 1247  GLUCAP 109*    Discharge time spent: greater than 30 minutes.  Signed: Nena Rebel, MD [facility] 10/19/2024

## 2024-10-19 NOTE — Care Management Obs Status (Signed)
 MEDICARE OBSERVATION STATUS NOTIFICATION   Patient Details  Name: Tyler Ochoa MRN: [id number] Date of Birth: 08/26/1951   Medicare Observation Status Notification Given:  Chaney BRANDY CHRISTIANE LELON, CMA 10/19/2024, 12:50 PM

## 2024-10-19 NOTE — Progress Notes (Addendum)
 SLP Cancellation Note  Patient Details Name: Tyler Ochoa MRN: [id number] DOB: 01/22/1951   Cancelled treatment:       Reason Eval/Treat Not Completed: SLP screened, no needs identified, will sign off (chart reviewed; consulted NSG and met w/ pt in room)   Pt is a 73 y.o. male with medical history significant for CHF, cardiac amyloidosis, CKD, HLD, HTN, arthritis who presented to ED at [facility] complaining of acute onset of right upper extremity numbness and weakness started 9 PM 10/16/2024 on Tuesday, 2 days Thursday 2 days ago.  He stated that his symptoms resolved by the morning but he was concerned and decided to come to emergency room to get checked out.  He feels like he is at his baseline.   Pt denied any difficulty swallowing and is currently on a regular diet; tolerates swallowing pills w/ water per NSG. Pt conversed in conversation w/out expressive/receptive deficits noted; pt denied any speech-language deficits. Speech clear. Followed instructions appropriately. A/O x3.  No further skilled ST services indicated as pt appears at his baseline. Pt agreed. NSG to reconsult if any change in status while admitted.      Comer Portugal, MS, CCC-SLP Speech Language Pathologist Rehab Services; [facility] [phone] (ascom) Suan Pyeatt 10/19/2024, 8:23 AM

## 2024-10-19 NOTE — Progress Notes (Signed)
*  PRELIMINARY RESULTS* Echocardiogram 2D Echocardiogram has been performed.  Floydene Harder 10/19/2024, 8:56 AM

## 2024-10-19 NOTE — Progress Notes (Signed)
° °  Brief Progress Note   _____________________________________________________________________________________________________________  Patient Name: Tyler Ochoa Patient DOB: 02/18/1951 Date: 10/19/2024      Data: Patient admitted with acute stroke. GCS 15. Patient is Progressive level of care with neuro checks ordered every 4 hours. Patient admitted since 12/11.    Action: Reached out to Dr Roann to see if patient can be downgraded.    Response: Dr Roann to place level of care change.  _____________________________________________________________________________________________________________  [facility] RN Expeditor Dayn Barich Please contact us  directly via secure chat (search for [facility]) or by calling us  at [phone] [facility]).

## 2024-10-19 NOTE — Hospital Course (Signed)
 Right upper extremity weakness and slurred speech resolved within 24 hours   HPI: Tyler Ochoa is a pleasant 73 y.o. male with medical history significant for CHF, cardiac amyloidosis, CKD, HLD, HTN, arthritis who presented to ED at [facility] complaining of acute onset of right upper extremity numbness and weakness started 9 PM 10/16/2024 on Tuesday, 2 days Thursday 2 days ago.  He stated that his symptoms resolved by the morning but he was concerned and decided to come to emergency room today.  He feels like he is at his baseline.  He takes aspirin  daily at home.  Patient denies any trauma, change in mental status, headache, nausea, vomiting, gait problem.  However he had some slurred speech for transient period of time.  He does not remember having similar symptoms in the past.   ED Course: Upon arrival to the ED, patient is found to be tachycardic in 118 with some flutter, EKG showed a flutter with variable block at 97 bpm, no ST elevation, CT scan of the brain showed right-sided CVA and possible small surrounding subarachnoid/petechial hemorrhages.  Neurology was consulted who advised to do the stroke workup but no antiplatelet agents.  Hospitalist service was consulted for evaluation for admission for stroke workup  Patient was worked up for stroke which did not show any worsening.  MRI showed ischemic stroke with some punctate hemorrhage.  Neurologist seen the patient and advised for aspirin .  Cardiology seen the patient and they wanted to start anticoagulation after talking to neurologist.  Neurologist advised to start anything after a week or so due to abnormal coags with elevated INR and PTT.  I went to see the patient and explained everything but he did not have time to wait for the discharge  completion.  He did not want to wait for medications.  He left AGAINST MEDICAL ADVICE.

## 2024-10-29 ENCOUNTER — Telehealth (HOSPITAL_COMMUNITY): Payer: Self-pay

## 2024-10-29 ENCOUNTER — Other Ambulatory Visit (HOSPITAL_COMMUNITY): Payer: Self-pay

## 2024-10-29 NOTE — Telephone Encounter (Signed)
 Advanced Heart Failure Patient Advocate Encounter  Prior authorization for Vyndamax  ([id number]) cancelled, as there is an active approval for this medication from 08/08/2024 until 11/07/2025.  Rachel DEL, CPhT Rx Patient Advocate Phone: [phone]

## 2024-11-07 ENCOUNTER — Other Ambulatory Visit: Payer: Self-pay

## 2024-11-13 ENCOUNTER — Other Ambulatory Visit: Payer: Self-pay

## 2024-11-13 ENCOUNTER — Telehealth (HOSPITAL_COMMUNITY): Payer: Self-pay

## 2024-11-13 ENCOUNTER — Other Ambulatory Visit (HOSPITAL_COMMUNITY): Payer: Self-pay

## 2024-11-13 NOTE — Telephone Encounter (Signed)
 Advanced Heart Failure Patient Advocate Encounter  The patient was approved for a [employer] grant that will help cover the cost of Carvedilol , Entresto , Farxiga , Spironolactone , Vyndamax .  Total amount awarded, $7,500.  Effective: 10/14/2024 - 10/13/2025.  BIN [id number] PCN [id number] [id number] ID [id number]  Approval and processing information added to GAILA Rachel DEL, CPhT Rx Patient Advocate Phone: [phone]

## 2024-11-14 ENCOUNTER — Other Ambulatory Visit: Payer: Self-pay

## 2024-11-14 NOTE — Progress Notes (Signed)
 Specialty Pharmacy Refill Coordination Note  Tyler Ochoa is a 74 y.o. male contacted today regarding refills of specialty medication(s) Tafamidis  (Vyndamax )   Patient requested Delivery   Delivery date: 11/23/24   Verified address: [address] [place] [address]   Medication will be filled on: 11/22/24

## 2024-11-19 ENCOUNTER — Other Ambulatory Visit: Payer: Self-pay

## 2024-11-19 ENCOUNTER — Other Ambulatory Visit (HOSPITAL_COMMUNITY): Payer: Self-pay | Admitting: Family Medicine

## 2024-11-19 NOTE — Progress Notes (Signed)
 Patient called in requesting an earlier delivery date. Medication will be filled on 11/21/24 and will be delivered on 11/22/24 per patients request.

## 2024-11-21 ENCOUNTER — Other Ambulatory Visit: Payer: Self-pay

## 2024-11-25 ENCOUNTER — Other Ambulatory Visit (HOSPITAL_COMMUNITY): Payer: Self-pay | Admitting: Internal Medicine

## 2024-11-25 ENCOUNTER — Other Ambulatory Visit (HOSPITAL_COMMUNITY): Payer: Self-pay | Admitting: Family Medicine

## 2024-12-12 ENCOUNTER — Other Ambulatory Visit (HOSPITAL_COMMUNITY): Payer: Self-pay | Admitting: Internal Medicine

## 2024-12-12 ENCOUNTER — Other Ambulatory Visit (HOSPITAL_COMMUNITY): Payer: Self-pay

## 2024-12-12 ENCOUNTER — Other Ambulatory Visit: Payer: Self-pay

## 2024-12-12 MED ORDER — VYNDAMAX 61 MG PO CAPS
1.0000 | ORAL_CAPSULE | Freq: Every day | ORAL | 11 refills | Status: AC
  Filled 2024-12-12 – 2024-12-13 (×2): qty 30, 30d supply, fill #0

## 2024-12-13 ENCOUNTER — Other Ambulatory Visit: Payer: Self-pay

## 2024-12-14 ENCOUNTER — Other Ambulatory Visit: Payer: Self-pay

## 2024-12-14 NOTE — Progress Notes (Signed)
 Specialty Pharmacy Refill Coordination Note  Tyler Ochoa is a 74 y.o. male contacted today regarding refills of specialty medication(s) Tafamidis  (Vyndamax )   Patient requested Delivery   Delivery date: 12/19/24   Verified address: [address]   Medication will be filled on: 12/18/24

## 2025-01-16 ENCOUNTER — Ambulatory Visit: Admitting: Family
